# Patient Record
Sex: Male | Born: 1950 | Race: White | Hispanic: No | Marital: Married | Smoking: Former smoker
Health system: Southern US, Community
[De-identification: ages and names within clinical notes are randomized; demographics above are authoritative.]

## PROBLEM LIST (undated history)

## (undated) DIAGNOSIS — Z973 Presence of spectacles and contact lenses: Secondary | ICD-10-CM

## (undated) DIAGNOSIS — T7840XA Allergy, unspecified, initial encounter: Secondary | ICD-10-CM

## (undated) DIAGNOSIS — K219 Gastro-esophageal reflux disease without esophagitis: Secondary | ICD-10-CM

## (undated) DIAGNOSIS — M1990 Unspecified osteoarthritis, unspecified site: Secondary | ICD-10-CM

## (undated) DIAGNOSIS — N433 Hydrocele, unspecified: Secondary | ICD-10-CM

## (undated) DIAGNOSIS — C61 Malignant neoplasm of prostate: Secondary | ICD-10-CM

## (undated) DIAGNOSIS — K5730 Diverticulosis of large intestine without perforation or abscess without bleeding: Secondary | ICD-10-CM

## (undated) DIAGNOSIS — Z860100 Personal history of colon polyps, unspecified: Secondary | ICD-10-CM

## (undated) DIAGNOSIS — I10 Essential (primary) hypertension: Secondary | ICD-10-CM

## (undated) DIAGNOSIS — N529 Male erectile dysfunction, unspecified: Secondary | ICD-10-CM

## (undated) DIAGNOSIS — J301 Allergic rhinitis due to pollen: Secondary | ICD-10-CM

## (undated) DIAGNOSIS — R972 Elevated prostate specific antigen [PSA]: Secondary | ICD-10-CM

## (undated) HISTORY — PX: POLYPECTOMY: SHX149

## (undated) HISTORY — DX: Allergic rhinitis due to pollen: J30.1

## (undated) HISTORY — PX: PROSTATE SURGERY: SHX751

## (undated) HISTORY — PX: PROSTATE BIOPSY: SHX241

## (undated) HISTORY — DX: Diverticulosis of large intestine without perforation or abscess without bleeding: K57.30

## (undated) HISTORY — PX: JOINT REPLACEMENT: SHX530

## (undated) HISTORY — DX: Allergy, unspecified, initial encounter: T78.40XA

## (undated) HISTORY — DX: Essential (primary) hypertension: I10

## (undated) HISTORY — PX: COLONOSCOPY: SHX174

## (undated) HISTORY — PX: COLONOSCOPY WITH PROPOFOL: SHX5780

---

## 2006-07-19 ENCOUNTER — Ambulatory Visit: Payer: Self-pay | Admitting: Internal Medicine

## 2006-07-19 LAB — CONVERTED CEMR LAB
ALT: 30 units/L (ref 0–40)
Basophils Relative: 0.9 % (ref 0.0–1.0)
Bilirubin, Direct: 0.1 mg/dL (ref 0.0–0.3)
CO2: 32 meq/L (ref 19–32)
Creatinine, Ser: 1.1 mg/dL (ref 0.4–1.5)
Eosinophils Relative: 6.8 % — ABNORMAL HIGH (ref 0.0–5.0)
GFR calc Af Amer: 89 mL/min
Glucose, Bld: 116 mg/dL — ABNORMAL HIGH (ref 70–99)
HCT: 44.7 % (ref 39.0–52.0)
Hemoglobin: 15.8 g/dL (ref 13.0–17.0)
Leukocytes, UA: NEGATIVE
Lymphocytes Relative: 28.9 % (ref 12.0–46.0)
Monocytes Absolute: 0.6 10*3/uL (ref 0.2–0.7)
Neutro Abs: 3.5 10*3/uL (ref 1.4–7.7)
Nitrite: NEGATIVE
Potassium: 4.3 meq/L (ref 3.5–5.1)
RDW: 12.4 % (ref 11.5–14.6)
Specific Gravity, Urine: 1.015 (ref 1.000–1.03)
TSH: 3.21 microintl units/mL (ref 0.35–5.50)
Total Bilirubin: 0.8 mg/dL (ref 0.3–1.2)
Total Protein, Urine: NEGATIVE mg/dL
Total Protein: 6.8 g/dL (ref 6.0–8.3)
Triglycerides: 140 mg/dL (ref 0–149)
Urobilinogen, UA: 0.2 (ref 0.0–1.0)
WBC: 6.5 10*3/uL (ref 4.5–10.5)

## 2006-07-26 ENCOUNTER — Ambulatory Visit: Payer: Self-pay | Admitting: Internal Medicine

## 2006-09-17 ENCOUNTER — Ambulatory Visit: Payer: Self-pay | Admitting: Internal Medicine

## 2006-10-05 ENCOUNTER — Ambulatory Visit: Payer: Self-pay | Admitting: Gastroenterology

## 2006-10-16 ENCOUNTER — Ambulatory Visit: Payer: Self-pay | Admitting: Gastroenterology

## 2006-10-16 ENCOUNTER — Encounter: Payer: Self-pay | Admitting: Gastroenterology

## 2007-08-03 ENCOUNTER — Encounter: Payer: Self-pay | Admitting: *Deleted

## 2007-08-03 DIAGNOSIS — K5730 Diverticulosis of large intestine without perforation or abscess without bleeding: Secondary | ICD-10-CM | POA: Insufficient documentation

## 2007-08-03 DIAGNOSIS — I10 Essential (primary) hypertension: Secondary | ICD-10-CM

## 2007-08-03 DIAGNOSIS — D126 Benign neoplasm of colon, unspecified: Secondary | ICD-10-CM

## 2007-09-20 ENCOUNTER — Ambulatory Visit: Payer: Self-pay | Admitting: Internal Medicine

## 2007-09-20 LAB — CONVERTED CEMR LAB
Calcium: 9.1 mg/dL (ref 8.4–10.5)
Creatinine, Ser: 1 mg/dL (ref 0.4–1.5)
GFR calc Af Amer: 99 mL/min
Glucose, Bld: 98 mg/dL (ref 70–99)
Sodium: 140 meq/L (ref 135–145)

## 2007-09-22 ENCOUNTER — Encounter: Payer: Self-pay | Admitting: Internal Medicine

## 2008-09-08 ENCOUNTER — Ambulatory Visit: Payer: Self-pay | Admitting: Internal Medicine

## 2008-09-08 LAB — CONVERTED CEMR LAB
ALT: 32 units/L (ref 0–53)
AST: 23 units/L (ref 0–37)
Basophils Relative: 0.6 % (ref 0.0–3.0)
Bilirubin Urine: NEGATIVE
CO2: 30 meq/L (ref 19–32)
Calcium: 9 mg/dL (ref 8.4–10.5)
Creatinine, Ser: 1.2 mg/dL (ref 0.4–1.5)
Eosinophils Relative: 6 % — ABNORMAL HIGH (ref 0.0–5.0)
GFR calc non Af Amer: 66.01 mL/min (ref >60)
Glucose, Bld: 115 mg/dL — ABNORMAL HIGH (ref 70–99)
HCT: 43.4 % (ref 39.0–52.0)
HDL: 38.9 mg/dL — ABNORMAL LOW (ref >39.00)
Hemoglobin: 15.1 g/dL (ref 13.0–17.0)
Ketones, ur: NEGATIVE mg/dL
Leukocytes, UA: NEGATIVE
Lymphs Abs: 1.6 10*3/uL (ref 0.7–4.0)
MCV: 88.1 fL (ref 78.0–100.0)
Monocytes Absolute: 0.6 10*3/uL (ref 0.1–1.0)
Monocytes Relative: 9.7 % (ref 3.0–12.0)
Neutro Abs: 3.6 10*3/uL (ref 1.4–7.7)
Nitrite: NEGATIVE
Platelets: 238 10*3/uL (ref 150.0–400.0)
RBC: 4.93 M/uL (ref 4.22–5.81)
Sodium: 141 meq/L (ref 135–145)
TSH: 2.03 microintl units/mL (ref 0.35–5.50)
Total Bilirubin: 0.6 mg/dL (ref 0.3–1.2)
Total CHOL/HDL Ratio: 4
Total Protein: 7.4 g/dL (ref 6.0–8.3)
WBC: 6.2 10*3/uL (ref 4.5–10.5)
pH: 6 (ref 5.0–8.0)

## 2008-09-15 ENCOUNTER — Ambulatory Visit: Payer: Self-pay | Admitting: Internal Medicine

## 2009-11-05 ENCOUNTER — Ambulatory Visit: Payer: Self-pay | Admitting: Internal Medicine

## 2009-11-05 LAB — CONVERTED CEMR LAB
ALT: 38 units/L (ref 0–53)
AST: 24 units/L (ref 0–37)
Alkaline Phosphatase: 73 units/L (ref 39–117)
BUN: 18 mg/dL (ref 6–23)
Basophils Relative: 0.9 % (ref 0.0–3.0)
Bilirubin Urine: NEGATIVE
Bilirubin, Direct: 0.1 mg/dL (ref 0.0–0.3)
Chloride: 105 meq/L (ref 96–112)
Creatinine, Ser: 1.1 mg/dL (ref 0.4–1.5)
Eosinophils Relative: 6.2 % — ABNORMAL HIGH (ref 0.0–5.0)
GFR calc non Af Amer: 76.7 mL/min (ref >60)
Glucose, Bld: 103 mg/dL — ABNORMAL HIGH (ref 70–99)
Hemoglobin: 14.3 g/dL (ref 13.0–17.0)
Ketones, ur: NEGATIVE mg/dL
LDL Cholesterol: 75 mg/dL (ref 0–99)
Lymphocytes Relative: 24.5 % (ref 12.0–46.0)
Neutro Abs: 4.1 10*3/uL (ref 1.4–7.7)
Neutrophils Relative %: 58.9 % (ref 43.0–77.0)
Nitrite: NEGATIVE
Potassium: 4 meq/L (ref 3.5–5.1)
RBC: 4.72 M/uL (ref 4.22–5.81)
Total Protein, Urine: NEGATIVE mg/dL
Total Protein: 7 g/dL (ref 6.0–8.3)
Urine Glucose: NEGATIVE mg/dL
VLDL: 28.4 mg/dL (ref 0.0–40.0)
WBC: 7 10*3/uL (ref 4.5–10.5)
pH: 6 (ref 5.0–8.0)

## 2009-11-12 ENCOUNTER — Ambulatory Visit: Payer: Self-pay | Admitting: Internal Medicine

## 2010-05-18 ENCOUNTER — Ambulatory Visit
Admission: RE | Admit: 2010-05-18 | Discharge: 2010-05-18 | Payer: Self-pay | Source: Home / Self Care | Attending: Internal Medicine | Admitting: Internal Medicine

## 2010-05-18 DIAGNOSIS — M5430 Sciatica, unspecified side: Secondary | ICD-10-CM | POA: Insufficient documentation

## 2010-05-18 DIAGNOSIS — E669 Obesity, unspecified: Secondary | ICD-10-CM | POA: Insufficient documentation

## 2010-05-19 ENCOUNTER — Telehealth: Payer: Self-pay | Admitting: Internal Medicine

## 2010-05-23 ENCOUNTER — Encounter: Payer: Self-pay | Admitting: Internal Medicine

## 2010-06-14 NOTE — Assessment & Plan Note (Signed)
Summary: PHY-OYU   Vital Signs:  Patient profile:   60 year old male Height:      66 inches Weight:      220 pounds BMI:     35.64 O2 Sat:      96 % on Room air Temp:     97.7 degrees F oral Pulse rate:   76 / minute BP sitting:   124 / 80  (left arm) Cuff size:   large  Vitals Entered By: Bill Salinas CMA (November 12, 2009 2:27 PM)  O2 Flow:  Room air CC: pt here for cpx/ab  Vision Screening:      Vision Comments: Pt had normal eye exam with Dr Freida Busman in [place] April 2011   Primary Care Provider:  Mirah Nevins  CC:  pt here for cpx/ab.  History of Present Illness: Patient presents for routine medical exam.  He reports that he has some numbness in the left hand at the ulnar aspect. He is also sensitive at the elbow especially with weight bearing/leaning on the elbow.  He reports that his sister died last month: lymphoma at age 8.     Current Medications (verified): 1)  Hydrochlorothiazide 25 Mg  Tabs (Hydrochlorothiazide) .... 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  Married '77- 19 years divorced; married '02 2 sons - '79, '80; 1 grandson  Things are OK  Risk Factors: Alcohol Use: <1 (09/15/2008) Caffeine Use: 1 (09/15/2008) Exercise: no (14-Oct-2007)  Risk Factors: Smoking Status: quit (09/15/2008)  Review of Systems       The  patient complains of severe indigestion/heartburn.  The patient denies anorexia, fever, weight loss, weight gain, vision loss, decreased hearing, hoarseness, chest pain, syncope, peripheral edema, prolonged cough, headaches, hemoptysis, abdominal pain, melena, hematochezia, hematuria, incontinence, muscle weakness, suspicious skin lesions, difficulty walking, depression, unusual weight change, abnormal bleeding, enlarged lymph nodes, angioedema, and testicular masses.    Physical Exam  General:  WNWD white male in no distress Head:  Normocephalic and atraumatic without obvious abnormalities. No apparent alopecia or balding. Eyes:  No corneal or conjunctival inflammation noted. EOMI. Perrla. Funduscopic exam benign, without hemorrhages, exudates or papilledema. Vision grossly normal. Ears:  External ear exam shows no significant lesions or deformities.  Otoscopic examination reveals clear canals, tympanic membranes are intact bilaterally without bulging, retraction, inflammation or discharge. Hearing is grossly normal bilaterally. Nose:  no external deformity and no external erythema.   Mouth:  Oral mucosa and oropharynx without lesions or exudates.  Teeth in good repair. Neck:  supple, no masses, no thyromegaly, and no carotid bruits.   Chest Wall:  no deformities.   Lungs:  Normal respiratory effort, chest expands symmetrically. Lungs are clear to auscultation, no crackles or wheezes. Heart:  Normal rate and regular rhythm. S1 and S2 normal without gallop, murmur, click, rub or other extra sounds. Abdomen:  Bowel sounds positive,abdomen soft and non-tender without masses, organomegaly or hernias noted. Prostate:  deferred to normal PSA Msk:  normal ROM, no  joint tenderness, no joint swelling, and no redness over joints.   Pulses:  2+ radial Extremities:  No clubbing, cyanosis, edema, or deformity noted with normal full range of motion of all joints.   Neurologic:  alert & oriented X3, cranial  nerves II-XII intact, strength normal in all extremities, gait normal, and DTRs symmetrical and normal.  Normal sensation in his hands. Negative Tinel's Skin:  turgor normal, color normal, and no suspicious lesions.   Cervical Nodes:  no anterior cervical adenopathy and no posterior cervical adenopathy.   Psych:  Oriented X3, memory intact for recent and remote, normally interactive, and good eye contact.     Impression & Recommendations:  Problem # 1:  HYPERTENSION (ICD-401.9)  His updated medication list for this problem includes:    Hydrochlorothiazide 25 Mg Tabs (Hydrochlorothiazide) .Marland Kitchen... Take 1/2 tablet by mouth once a day  BP today: 124/80 Prior BP: 112/80 (09/15/2008)  Labs Reviewed: K+: 4.0 (11/05/2009) Creat: : 1.1 (11/05/2009)    Good control on low dose medication. Renal function and potassium normal  Plan - continue present meds.  Problem # 2:  Preventive Health Care (ICD-V70.0) Unremarkable history except for some numbness which is most likely positional -pressure at the ulnar notch at the elbow. Exam is normal. Lab results are within normal limits. He is current witho colorectal cancer screening with last colonoscopy in '08. He is current with tetnus booster.  In summary - a nice man wo appears to be medically stable. He should have a follow-up exam in 18-24 months. He will otherwise return on a as needed basis.  Complete Medication List: 1)  Hydrochlorothiazide 25 Mg Tabs (Hydrochlorothiazide) .... Take 1/2 tablet by mouth once a day  Luis Knapp Note: All result statuses are Final unless otherwise noted.  Tests: (1) BMP (METABOL)   Sodium                    142 mEq/L                   135-145   Potassium                 4.0 mEq/L                   3.5-5.1   Chloride                  105 mEq/L                   96-112   Carbon Dioxide            30 mEq/L                    19-32   Glucose              [H]  103 mg/dL                   16-10   BUN                        18 mg/dL                    9-60   Creatinine                1.1 mg/dL                   4.5-4.0   Calcium  9.0 mg/dL                   1.6-10.9   GFR                       76.70 mL/min                >60  Tests: (2) Lipid Panel (LIPID)   Cholesterol               143 mg/dL                   6-045     ATP III Classification            Desirable:  < 200 mg/dL                    Borderline High:  200 - 239 mg/dL               High:  > = 240 mg/dL   Triglycerides             142.0 mg/dL                 4.0-981.1     Normal:  <150 mg/dL     Borderline High:  914 - 199 mg/dL   HDL                       78.29 mg/dL                 >56.21   VLDL Cholesterol          28.4 mg/dL                  3.0-86.5   LDL Cholesterol           75 mg/dL                    7-84  CHO/HDL Ratio:  CHD Risk                             4                    Men          Women     1/2 Average Risk     3.4          3.3     Average Risk          5.0          4.4     2X Average Risk          9.6          7.1     3X Average Risk          15.0          11.0                           Tests: (3) CBC Platelet w/Diff (CBCD)   White Cell Count          7.0 K/uL                    4.5-10.5   Red Cell Count            4.72 Mil/uL  4.22-5.81   Hemoglobin                14.3 g/dL                   19.1-47.8   Hematocrit                41.0 %                      39.0-52.0   MCV                       87.0 fl                     78.0-100.0   MCHC                      34.8 g/dL                   29.5-62.1   RDW                       13.4 %                      11.5-14.6   Platelet Count            286.0 K/uL                  150.0-400.0   Neutrophil %              58.9 %                      43.0-77.0   Lymphocyte %              24.5 %                      12.0-46.0   Monocyte %                9.5 %                       3.0-12.0   Eosinophils%         [H]  6.2 %                        0.0-5.0   Basophils %               0.9 %                       0.0-3.0   Neutrophill Absolute      4.1 K/uL                    1.4-7.7   Lymphocyte Absolute       1.7 K/uL                    0.7-4.0   Monocyte Absolute         0.7 K/uL                    0.1-1.0  Eosinophils, Absolute                             0.4 K/uL  0.0-0.7   Basophils Absolute        0.1 K/uL                    0.0-0.1  Tests: (4) Hepatic/Liver Function Panel (HEPATIC)   Total Bilirubin           0.4 mg/dL                   7.8-2.9   Direct Bilirubin          0.1 mg/dL                   5.6-2.1   Alkaline Phosphatase      73 U/L                      39-117   AST                       24 U/L                      0-37   ALT                       38 U/L                      0-53   Total Protein             7.0 g/dL                    3.0-8.6   Albumin                   4.2 g/dL                    5.7-8.4  Tests: (5) TSH (TSH)   FastTSH                   2.68 uIU/mL                 0.35-5.50  Tests: (6) Prostate Specific Antigen (PSA)   PSA-Hyb                   2.98 ng/mL                  0.10-4.00  Tests: (7) UDip Only (UDIP)   Color                     LT. YELLOW       RANGE:  Yellow;Lt. Yellow   Clarity                   CLEAR                       Clear   Specific Gravity          1.025                       1.000 - 1.030   Urine Ph                  6.0                         5.0-8.0   Protein  NEGATIVE                    Negative   Urine Glucose             NEGATIVE                    Negative   Ketones                   NEGATIVE                    Negative   Urine Bilirubin           NEGATIVE                    Negative   Blood                     TRACE-LYSED                 Negative   Urobilinogen              0.2                         0.0 - 1.0   Leukocyte Esterace        NEGATIVE                    Negative   Nitrite                   NEGATIVE                     NegativePrescriptions: HYDROCHLOROTHIAZIDE 25 MG  TABS (HYDROCHLOROTHIAZIDE) Take 1/2 tablet by mouth once a day  #90 x 3   Entered and Authorized by:   Jacques Navy MD   Signed by:   Jacques Navy MD on 11/12/2009   Method used:   Electronically to        [facility]  [phone]* (retail)       [address] [place], [place]  [address]       Ph: [phone] or [phone]       Fax: [phone]   RxID:   [id number]

## 2010-06-16 NOTE — Assessment & Plan Note (Signed)
Summary: BACK PAIN/NWS  #   Vital Signs:  Patient profile:   60 year old male Height:      66 inches Weight:      227 pounds BMI:     36.77 O2 Sat:      97 % on Room air Temp:     97.5 degrees F oral Pulse rate:   85 / minute BP sitting:   160 / 98  (left arm) Cuff size:   large  Vitals Entered By: Ami Bullins CMA (May 18, 2010 9:09 AM)  O2 Flow:  Room air CC: pt here with c/o lower back pain radiating to his right leg with tingling sensation  down to his right foot. Symptoms constent since christmas with little relief taking acetaminophen/ ab   CC:  pt here with c/o lower back pain radiating to his right leg with tingling sensation  down to his right foot. Symptoms constent since christmas with little relief taking acetaminophen/ ab.  Current Medications (verified): 1)  Hydrochlorothiazide 25 Mg  Tabs (Hydrochlorothiazide) .... Take 1/2 Tablet By Mouth Once A Day  Allergies (verified): No Known Drug Allergies  Physical Exam  General:  Well-developed,well-nourished,in no acute distress; alert,appropriate and cooperative throughout examination Eyes:  pupils equal and pupils round.   Neck:  supple.   Lungs:  normal respiratory effort.   Heart:  normal rate and regular rhythm.   Abdomen:  obese Msk:  back exam: nl stand, flex to 140 degrees with some pain, nl gait, nl toe/heel walk, DTR at patellar tendon slightly diminished right vs left, nl achilles reflex bilaterally, nl sensation to light touch and deep vibratory sensation. No CVAT. Pulses:  2+ radial Neurologic:  alert & oriented X3 and cranial nerves II-XII intact.   Skin:  turgor normal and color normal.     Impression & Recommendations:  Problem # 1:  SCIATICA (ICD-724.3) Mild to moderate discomfort with a diminished patellar reflex right. Suspect mild nerve compression:DJD vs DDD.  Plan - L-S spine films with flex and extension           refer to PT           otc NSAID of choice - GI precautions  given.  Orders: T-Lumbar Spine Comp w/[facility] [phone]) Physical Therapy Referral (PT)  Problem # 2:  OBESITY, CLASS II (ICD-278.00) discussed weight mgt: smart food choices, PORTION SIZE  control, exercise - 30 minutes 3 times a week. Target - 25 lb weight loss: goal 1 lb per month.   Complete Medication List: 1)  Hydrochlorothiazide 25 Mg Tabs (Hydrochlorothiazide) .... Take 1/2 tablet by mouth once a day   Orders Added: 1)  T-Lumbar Spine Comp w/[facility] [72114TC] 2)  Physical Therapy Referral [PT] 3)  Est. Patient Level III [45409]

## 2010-06-16 NOTE — Progress Notes (Signed)
Summary: xray results  Phone Note Call from Patient Call back at Home Phone [phone]   Caller: Patient Call For: DR Aubri Gathright Summary of Call: Pt requests xray results. Initial call taken by: Verdell Face,  May 19, 2010 10:07 AM  Follow-up for Phone Call        called patient: mild loss of disk height L4-5, L5-S1.  Plan - prednisone burst and taper see RX Follow-up by: Jacques Navy MD,  May 19, 2010 6:38 PM    New/Updated Medications: PREDNISONE 10 MG TABS (PREDNISONE) 4 tabs x 1 day, 3 tabs once daily x 3 days, 2 tabs once daily x 3 days, 1 tab once daily x 6 days for discogenic back pain Prescriptions: PREDNISONE 10 MG TABS (PREDNISONE) 4 tabs x 1 day, 3 tabs once daily x 3 days, 2 tabs once daily x 3 days, 1 tab once daily x 6 days for discogenic back pain  #25 x 0   Entered and Authorized by:   Jacques Navy MD   Signed by:   Jacques Navy MD on 05/19/2010   Method used:   [facility]ron[facility] to        [facility]  [phone]* (retail)       [address] [place], [place]  [address]       Ph: [phone] or [phone]       Fax: [phone]   RxID:   [id number]

## 2010-06-20 ENCOUNTER — Encounter: Payer: Self-pay | Admitting: Internal Medicine

## 2010-06-22 NOTE — Miscellaneous (Signed)
Summary: [facility]  [facility]   Imported By: Sherian Rein 06/13/2010 07:04:46  _____________________________________________________________________  External Attachment:    Type:   Image     Comment:   External Document

## 2010-07-12 NOTE — Letter (Signed)
Summary: [facility]  [facility]   Imported By: Lester Roy 07/05/2010 07:42:00  _____________________________________________________________________  External Attachment:    Type:   Image     Comment:   External Document

## 2010-09-30 NOTE — Assessment & Plan Note (Signed)
[facility]                           PRIMARY CARE OFFICE NOTE   [name], [name]                      MRN:          [id number]  DATE:07/26/2006                            DOB:          1950-08-13    Luis Knapp is a 60 year old Caucasian gentleman who presents to  establish for ongoing continuity care.   CHIEF COMPLAINT:  1. URI.  Patient reports that the last 7 to 10 days he developed a      problem with very thick mucus drainage that was difficult to clear,      and obstructing his breathing.  He was seen at [facility], and at that time had a fever and was diagnosed with      infection; treated with antibiotic.  The patient cannot recall the      name of this medication.  He had no other treatment.  He still has      some persistent drainage, but it is much less thick.  He reports he      just has some agitation and jitteriness, and has been homebound for      approximately a week.  2. Right upper quadrant abdominal pain.  He reports that this is      intermittent.  It is kind of an achy discomfort.  There is some      radiation to his back.  His discomfort is worse after eating.  He      has had no change in his stools.  He does have a chronic      intermittent bowel habit.   PAST MEDICAL HISTORY:   SURGERIES:  None.   TRAUMA:  Shattered right great toe 25 years ago.   MEDICAL:  Patient had the usual childhood diseases, and is fully  immunized.  He has history of hay fever that is seasonal, history of  high blood pressure readings, and is on medications.   CURRENT MEDICATIONS:  Hydrochlorothiazide 25 mg daily.   HABITS:  Patient is a former smoker, but does not currently smoke.  He  does not use any alcoholic beverages or recreational drugs.   FAMILY HISTORY:  Father died at age 51.  He had an MI, questionable  pulmonary embolus.  He also had dementia and was in a nursing facility.  Mother is 5.  She has  dementia.  She is in assisted living.  Patient  has 2 sisters, 1 in good health, a half sister with non-Hodgkin's  lymphoma and diabetes.  One brother in good health.  Family history  otherwise unremarkable except for hypertension.   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  He works for a firm out of [place], but has been assigned to  [place] for several years working for [employer].  He is able  to do much of his work out of the home.  Patient was an Probation officer.  Patient was married for 19 years, divorced.  Married to his current wife  for 6 years.  He has 2 sons.  His marriage is in good health.  His wife  is in good health.   REVIEW OF SYSTEMS:  Patient has had no fevers, sweats, or chills.  His  weight has been stable.  He does have sleep latency and sleep duration  problems, which he associates with stress, and has been using an over-  the-counter product for sleep.  Patient had an eye exam 3 months ago.  No ENT, cardiovascular, or respiratory complaints.  He has rare  heartburn that actually has improved with better eating habit.  No GE,  musculoskeletal, or neurologic problems.   EXAMINATION:  Temperature was 97.7.  Blood pressure 143/84.  Pulse 99.  Weight 204.  GENERAL APPEARANCE:  This is an overweight Caucasian gentleman in no  acute distress.  HEENT EXAM:  Normocephalic and atraumatic.  EACs and TMs are normal.  Oropharynx with native dentition in good repair.  No buccal or palate  lesions were noted.  Posterior pharynx was clear.  Conjunctivae and  sclerae were clear.  PERRLA.  EOMI.  Funduscopic exam was unremarkable.  NECK:  Was supple without thyromegaly.  NODES:  No adenopathy was noted in the cervical or supraclavicular  regions.  CHEST:  No CVA tenderness.  LUNGS:  Clear with no rales, wheezes, or rhonchi.  CARDIOVASCULAR:  Two plus radial pulse.  No JVD or carotid bruits.  He  had a quiet precordium with a  regular rate and rhythm without murmurs,  rubs, or gallops.  ABDOMEN:  Soft.  No guarding.  No rebound.  No organosplenomegaly was  appreciated.  GENITALIA:  Normal male phallus.  Bilaterally descended testicles  without masses.  RECTAL:  Exam revealed normal sphincter tone.  Prostate was smooth with  normal size and contour without nodules.  EXTREMITIES:  Without clubbing, cyanosis, edema, or deformity.  NEUROLOGIC:  Exam was nonfocal.  SKIN:  Was clear.   DATABASE:  Twelve-lead electrocardiogram revealed a normal sinus rhythm,  and it was a normal EKG.   LABORATORY DATABASE:  Hemoglobin 15.8 gm.  White count was 6.5 with a  normal differential.  Chemistries revealed a glucose of 116.  Electrolytes were normal.  Kidney function was normal with a creatinine  of 1.1 and a GFR of 74 mL per minute.  Liver functions were normal.  Cholesterol 183, triglycerides 140, HDL 42.8, LDL 112.  Thyroid function  normal with a TSH of 3.21.  PSA was normal at 2.23.  Urinalysis was  negative.   ASSESSMENT AND PLAN:  1. Lab abnormality.  Patient has a serum glucose of 1 point above      normal range.  I have encouraged him to take this as a warning      sign, and to be careful about concentrated sweets and simple      carbohydrates.  I also encouraged regular exercise program.  2. Health maintenance.  I have encouraged the patient to consider      colonoscopy.  He does not want to schedule at this time, but will      give it consideration.  Patient was given tetanus booster at      today's visit.   SUMMARY:  This is a very pleasant gentleman who seems medically stable  at this time who does need to be careful in regards to carbohydrates and  sugars.  He does need to contact me to schedule colonoscopy at his  convenience.     Rosalyn Gess Norins, MD  Electronically  Signed    MEN/MedQ  DD: 07/27/2006  DT: 07/28/2006  Job #: [id number]   cc:   Aron Baba

## 2010-11-22 ENCOUNTER — Other Ambulatory Visit (INDEPENDENT_AMBULATORY_CARE_PROVIDER_SITE_OTHER): Payer: Managed Care, Other (non HMO)

## 2010-11-22 ENCOUNTER — Other Ambulatory Visit: Payer: Self-pay | Admitting: Internal Medicine

## 2010-11-22 DIAGNOSIS — Z0000 Encounter for general adult medical examination without abnormal findings: Secondary | ICD-10-CM

## 2010-11-22 DIAGNOSIS — Z0389 Encounter for observation for other suspected diseases and conditions ruled out: Secondary | ICD-10-CM

## 2010-11-22 LAB — URINALYSIS
Bilirubin Urine: NEGATIVE
Hgb urine dipstick: NEGATIVE
Ketones, ur: NEGATIVE
Leukocytes, UA: NEGATIVE
Nitrite: NEGATIVE
Urobilinogen, UA: 0.2 (ref 0.0–1.0)

## 2010-11-22 LAB — LIPID PANEL
Cholesterol: 177 mg/dL (ref 0–200)
HDL: 45.9 mg/dL (ref >39.00)
Total CHOL/HDL Ratio: 4
Triglycerides: 221 mg/dL — ABNORMAL HIGH (ref 0.0–149.0)
VLDL: 44.2 mg/dL — ABNORMAL HIGH (ref 0.0–40.0)

## 2010-11-22 LAB — BASIC METABOLIC PANEL
CO2: 29 mEq/L (ref 19–32)
Chloride: 101 mEq/L (ref 96–112)
Potassium: 4.1 mEq/L (ref 3.5–5.1)

## 2010-11-22 LAB — CBC WITH DIFFERENTIAL/PLATELET
Basophils Relative: 1 % (ref 0.0–3.0)
Eosinophils Absolute: 0.4 10*3/uL (ref 0.0–0.7)
Lymphocytes Relative: 28.9 % (ref 12.0–46.0)
MCHC: 34.8 g/dL (ref 30.0–36.0)
Monocytes Relative: 9.2 % (ref 3.0–12.0)
Neutrophils Relative %: 54.2 % (ref 43.0–77.0)
RBC: 4.88 Mil/uL (ref 4.22–5.81)
WBC: 6.7 10*3/uL (ref 4.5–10.5)

## 2010-11-22 LAB — TSH: TSH: 2.69 u[IU]/mL (ref 0.35–5.50)

## 2010-11-22 LAB — HEPATIC FUNCTION PANEL
Albumin: 4.5 g/dL (ref 3.5–5.2)
Alkaline Phosphatase: 66 U/L (ref 39–117)
Total Protein: 7.1 g/dL (ref 6.0–8.3)

## 2010-11-22 LAB — PSA: PSA: 3.15 ng/mL (ref 0.10–4.00)

## 2010-11-24 ENCOUNTER — Encounter: Payer: Self-pay | Admitting: Internal Medicine

## 2010-11-29 ENCOUNTER — Encounter: Payer: Self-pay | Admitting: Internal Medicine

## 2010-12-02 ENCOUNTER — Encounter: Payer: Self-pay | Admitting: Internal Medicine

## 2010-12-02 ENCOUNTER — Ambulatory Visit (INDEPENDENT_AMBULATORY_CARE_PROVIDER_SITE_OTHER): Payer: Managed Care, Other (non HMO) | Admitting: Internal Medicine

## 2010-12-02 VITALS — BP 124/78 | HR 90 | Temp 98.6°F | Ht 66.5 in | Wt 231.0 lb

## 2010-12-02 DIAGNOSIS — Z0000 Encounter for general adult medical examination without abnormal findings: Secondary | ICD-10-CM

## 2010-12-02 DIAGNOSIS — I10 Essential (primary) hypertension: Secondary | ICD-10-CM

## 2010-12-02 DIAGNOSIS — M5430 Sciatica, unspecified side: Secondary | ICD-10-CM

## 2010-12-02 NOTE — Progress Notes (Signed)
Subjective:    Patient ID: Luis Knapp, male    DOB: Sep 16, 1950, 60 y.o.   MRN: [id number]  HPI Luis Knapp presents for a routine medical exam, In the interval since his last visit in Jan '12 he has had good results with improving his back pain and restoring mobility by seeing [facility]. 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  Otherwise doing well.   Past Medical History  Diagnosis Date  . Colon polyp   . Diverticulosis of colon (without mention of hemorrhage)   . Allergic rhinitis due to pollen   . Unspecified essential hypertension    No past surgical history on file. Family History  Problem Relation Age of Onset  . Coronary artery disease Father   . Heart attack Father     2nd one fatal  . Pulmonary embolism Father     ?  Marland Kitchen Dementia Father   . Dementia Mother   . Hypertension Mother   . Macular degeneration Mother   . Diabetes Sister   . Prostate cancer Neg Hx   . Colon cancer Neg Hx    History   Social History  . Marital Status: Married    Spouse Name: N/A    Number of Children: N/A  . Years of Education: N/A   Occupational History  . 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 Smoking status: Former Games developer  . Smokeless tobacco: Never Used  . Alcohol Use: No  . Drug Use: No  . Sexually Active: Not on file   Other Topics Concern  . Not on file   Social History Narrative   Married '77- 19 years divorced; married '02. 2 sons - '79, '80; 1 grandson. Things are ok. Work is OK - Quarry manager.        Review of Systems Review of Systems  Constitutional:  Negative for fever, chills, activity change and unexpected weight change.  HEENT:  Negative for hearing loss, ear pain, congestion, neck stiffness and postnasal drip. Negative for sore throat or swallowing problems. Negative for dental complaints.   Eyes: Negative for vision loss or change  in visual acuity.  Respiratory: Negative for chest tightness and wheezing.   Cardiovascular: Negative for chest pain and palpitation. No decreased exercise tolerance Gastrointestinal: No change in bowel habit. No bloating or gas. No reflux or indigestion Genitourinary: Negative for urgency, frequency, flank pain and difficulty urinating.  Musculoskeletal: Negative for myalgias, back pain, arthralgias and gait problem.  Neurological: Negative for dizziness, tremors, weakness and headaches.  Hematological: Negative for adenopathy.  Psychiatric/Behavioral: Negative for behavioral problems and dysphoric mood.       Objective:   Physical Exam Vital signs reviewed - weight is noted. Gen'l: Well nourished well developed white male in no acute distress  HENT:  Head: Normocephalic and atraumatic.  Right Ear: External ear normal. EAC/TM nl Left Ear: External ear normal.  EAC/TM nl Nose: Nose normal.  Mouth/Throat: Oropharynx is clear and moist. Dentition - native, in good repair. No buccal or palatal lesions. Posterior pharynx clear. Eyes: Conjunctivae and sclera clear. EOM intact. Pupils are equal, round, and reactive to light. Right eye exhibits no discharge. Left eye exhibits no discharge. Neck: Normal range of motion. Neck supple. No JVD present. No tracheal deviation present. No thyromegaly present.  Cardiovascular: Normal rate, regular rhythm, no gallop, no friction rub, no murmur  heard.      Quiet precordium. 2+ radial and DP pulses . No carotid bruits Pulmonary/Chest: Effort normal. No respiratory distress or increased WOB, no wheezes, no rales. No chest wall deformity or CVAT. Abdominal: Soft, protuberant. Bowel sounds are normal in all quadrants. He exhibits no distension, no tenderness, no rebound or guarding, No heptosplenomegaly  Genitourinary:  deferred Musculoskeletal: Normal range of motion. He exhibits no edema and no tenderness.       Small and large joints without redness,  synovial thickening or deformity. Full range of motion preserved about all small, median and large joints.  Lymphadenopathy:    He has no cervical or supraclavicular adenopathy.  Neurological: He is alert and oriented to person, place, and time. CN II-XII intact. DTRs 2+ and symmetrical biceps, radial and patellar tendons. Cerebellar function normal with no tremor, rigidity, normal gait and station.  Skin: Skin is warm and dry. No rash noted. No erythema.  Psychiatric: He has a normal mood and affect. His behavior is normal. Thought content normal.   Lab Results  Component Value Date   WBC 6.7 11/22/2010   HGB 14.9 11/22/2010   HCT 43.0 11/22/2010   PLT 259.0 11/22/2010   CHOL 177 11/22/2010   TRIG 221.0* 11/22/2010   HDL 45.90 11/22/2010   LDLDIRECT 110.7 11/22/2010   ALT 41 11/22/2010   AST 30 11/22/2010   NA 139 11/22/2010   K 4.1 11/22/2010   CL 101 11/22/2010   CREATININE 1.2 11/22/2010   BUN 22 11/22/2010   CO2 29 11/22/2010   TSH 2.69 11/22/2010   PSA 3.15 11/22/2010           Assessment & Plan:

## 2010-12-04 DIAGNOSIS — Z0000 Encounter for general adult medical examination without abnormal findings: Secondary | ICD-10-CM | POA: Insufficient documentation

## 2010-12-04 NOTE — Assessment & Plan Note (Signed)
Interval medical history is stable. Physical exam is only notable for midriff weight gain. Lab results are in normal limits including a very good lipid profile and normal PSA. He is current with colorectal cancer screening with last study in May '09. Immunizations: Tdap May '09. He is a candidate for shingles vaccine at his discretion.  In summary - a nice man who appears to be medically stable. He is encouraged to address weight management: reviewed with him the principles of smart food choices, portion size control - hand as measure and regular exercise. Goal is to loose 1 lb per month with a target weight of 200 lbs. He will return in as needed or in 1 year.

## 2010-12-04 NOTE — Assessment & Plan Note (Signed)
BP Readings from Last 3 Encounters:  12/02/10 124/78  05/18/10 160/98  11/12/09 124/80   Good control on HCTZ alone! Labs are good.  Plan - no change in regimen.

## 2010-12-04 NOTE — Assessment & Plan Note (Signed)
He reports that [facility] has been very helpful and he is able to do almost activities. He does continue the exercises he has been taught and does continue to make visits to them.  Plan - continue life-style management of this problem.

## 2011-04-28 ENCOUNTER — Other Ambulatory Visit: Payer: Self-pay | Admitting: Internal Medicine

## 2011-08-29 ENCOUNTER — Encounter: Payer: Self-pay | Admitting: Gastroenterology

## 2011-10-17 ENCOUNTER — Ambulatory Visit (AMBULATORY_SURGERY_CENTER): Payer: Managed Care, Other (non HMO) | Admitting: *Deleted

## 2011-10-17 VITALS — Ht 66.0 in | Wt 231.6 lb

## 2011-10-17 DIAGNOSIS — Z1211 Encounter for screening for malignant neoplasm of colon: Secondary | ICD-10-CM

## 2011-10-17 MED ORDER — PEG-KCL-NACL-NASULF-NA ASC-C 100 G PO SOLR: ORAL | Status: DC

## 2011-10-30 ENCOUNTER — Encounter: Payer: Self-pay | Admitting: Gastroenterology

## 2011-10-30 ENCOUNTER — Ambulatory Visit (AMBULATORY_SURGERY_CENTER): Payer: Managed Care, Other (non HMO) | Admitting: Gastroenterology

## 2011-10-30 VITALS — BP 123/80 | HR 79 | Temp 97.4°F | Resp 13 | Ht 66.0 in | Wt 231.0 lb

## 2011-10-30 DIAGNOSIS — Z86010 Personal history of colonic polyps: Secondary | ICD-10-CM

## 2011-10-30 DIAGNOSIS — Z1211 Encounter for screening for malignant neoplasm of colon: Secondary | ICD-10-CM

## 2011-10-30 DIAGNOSIS — D126 Benign neoplasm of colon, unspecified: Secondary | ICD-10-CM

## 2011-10-30 DIAGNOSIS — K5730 Diverticulosis of large intestine without perforation or abscess without bleeding: Secondary | ICD-10-CM

## 2011-10-30 MED ORDER — SODIUM CHLORIDE 0.9 % IV SOLN: 500.0000 mL | INTRAVENOUS | Status: DC

## 2011-10-30 NOTE — Op Note (Signed)
[facility] [address] [place], [place]  [address]  COLONOSCOPY PROCEDURE REPORT  PATIENT:  Luis Knapp, Luis Knapp  MR#:  [id number] BIRTHDATE:  1950/05/31, 60 yrs. old  GENDER:  male ENDOSCOPIST:  Barbette Hair. Arlyce Dice, MD REF. BY: PROCEDURE DATE:  10/30/2011 PROCEDURE:  Colonoscopy with snare polypectomy ASA CLASS:  Class II INDICATIONS:  Screening, history of pre-cancerous (adenomatous) colon polyps Index polypectomy 2008 MEDICATIONS:   MAC sedation, administered by CRNA propofol 200mg IV  DESCRIPTION OF PROCEDURE:   After the risks benefits and alternatives of the procedure were thoroughly explained, informed consent was obtained.  Digital rectal exam was performed and revealed no abnormalities.   The LB [id number] endoscope was introduced through the anus and advanced to the cecum, which was identified by both the appendix and ileocecal valve, without limitations.  The quality of the prep was excellent, using MoviPrep.  The instrument was then slowly withdrawn as the colon was fully examined. <<PROCEDUREIMAGES>>  FINDINGS:  A sessile polyp was found in the ascending colon. It was 3 mm in size. Polyp was snared without cautery. Retrieval was successful (see image4). snare polyp  Moderate diverticulosis was found in the sigmoid colon (see image1).  Scattered diverticula were found. descending to ascending colon  This was otherwise a normal examination of the colon (see image3 and image6). Retroflexed views in the rectum revealed no abnormalities.    The time to cecum =  1) 1.0  minutes. The scope was then withdrawn in 1) 6.75  minutes from the cecum and the procedure completed. COMPLICATIONS:  None ENDOSCOPIC IMPRESSION: 1) 3 mm sessile polyp in the ascending colon 2) Moderate diverticulosis in the sigmoid colon 3) Diverticula, scattered 4) Otherwise normal examination RECOMMENDATIONS: 1) If the polyp(s) removed today are proven to be adenomatous (pre-cancerous)  polyps, you will need a repeat colonoscopy in 5 years. Otherwise you should continue to follow colorectal cancer screening guidelines for "routine risk" patients with colonoscopy in 10 years. You will receive a letter within 1-2 weeks with the results of your biopsy as well as final recommendations. Please call my office if you have not received a letter after 3 weeks. REPEAT EXAM:  You will receive a letter from Dr. Arlyce Dice in 1-2 weeks, after reviewing the final pathology, with followup recommendations.  ______________________________ Barbette Hair Arlyce Dice, MD  CC:  Jacques Navy, MD  n. Rosalie DoctorBarbette Hair. Ayodele Sangalang at 10/30/2011 11:09 AM  Aron Baba, [phone]

## 2011-10-30 NOTE — Progress Notes (Signed)
Patient did not experience any of the following events: a burn prior to discharge; a fall within the facility; wrong site/side/patient/procedure/implant event; or a hospital transfer or hospital admission upon discharge from the facility. (G8907) Patient did not have preoperative order for IV antibiotic SSI prophylaxis. (G8918)  

## 2011-10-30 NOTE — Patient Instructions (Addendum)

## 2011-10-31 ENCOUNTER — Telehealth: Payer: Self-pay | Admitting: *Deleted

## 2011-10-31 NOTE — Telephone Encounter (Signed)
  Follow up Call-  Call back number 10/30/2011  Post procedure Call Back phone  # [phone]  Permission to leave phone message Yes     Patient questions:  Do you have a fever, pain , or abdominal swelling? no Pain Score  0 *  Have you tolerated food without any problems? yes  Have you been able to return to your normal activities? yes  Do you have any questions about your discharge instructions: Diet   no Medications  no Follow up visit  no  Do you have questions or concerns about your Care? no  Actions: * If pain score is 4 or above: No action needed, pain <4.

## 2011-11-03 ENCOUNTER — Encounter: Payer: Self-pay | Admitting: Gastroenterology

## 2012-03-20 ENCOUNTER — Ambulatory Visit (INDEPENDENT_AMBULATORY_CARE_PROVIDER_SITE_OTHER): Payer: Managed Care, Other (non HMO) | Admitting: Internal Medicine

## 2012-03-20 ENCOUNTER — Encounter: Payer: Self-pay | Admitting: Internal Medicine

## 2012-03-20 ENCOUNTER — Other Ambulatory Visit (INDEPENDENT_AMBULATORY_CARE_PROVIDER_SITE_OTHER): Payer: Managed Care, Other (non HMO)

## 2012-03-20 VITALS — BP 120/76 | HR 80 | Temp 97.9°F | Resp 14 | Ht 66.5 in | Wt 236.0 lb

## 2012-03-20 DIAGNOSIS — Z23 Encounter for immunization: Secondary | ICD-10-CM

## 2012-03-20 DIAGNOSIS — E669 Obesity, unspecified: Secondary | ICD-10-CM

## 2012-03-20 DIAGNOSIS — Z0000 Encounter for general adult medical examination without abnormal findings: Secondary | ICD-10-CM

## 2012-03-20 DIAGNOSIS — I10 Essential (primary) hypertension: Secondary | ICD-10-CM

## 2012-03-20 LAB — COMPREHENSIVE METABOLIC PANEL
ALT: 39 U/L (ref 0–53)
Albumin: 4.2 g/dL (ref 3.5–5.2)
Alkaline Phosphatase: 65 U/L (ref 39–117)
CO2: 29 mEq/L (ref 19–32)
GFR: 67.16 mL/min (ref >60.00)
Glucose, Bld: 87 mg/dL (ref 70–99)
Potassium: 3.9 mEq/L (ref 3.5–5.1)
Sodium: 138 mEq/L (ref 135–145)
Total Protein: 7.3 g/dL (ref 6.0–8.3)

## 2012-03-20 MED ORDER — HYDROCHLOROTHIAZIDE 25 MG PO TABS: 12.5000 mg | ORAL_TABLET | Freq: Every day | ORAL | Status: DC

## 2012-03-20 NOTE — Progress Notes (Signed)
Subjective:    Patient ID: Luis Knapp, male    DOB: 09-01-1950, 61 y.o.   MRN: [id number]  HPI Luis Knapp for an annual wellness exam. Interval history negative for any major medical, surgery, injury. He has seen dentist in last 12 months, he had an eye exam about a year ago. No exercise program. No sun exposure.   Past Medical History  Diagnosis Date  . Colon polyp   . Diverticulosis of colon (without mention of hemorrhage)   . Allergic rhinitis due to pollen   . Unspecified essential hypertension    Past Surgical History  Procedure Date  . No prior surgery    Family History  Problem Relation Age of Onset  . Coronary artery disease Father   . Heart attack Father     2nd one fatal  . Pulmonary embolism Father     ?  Marland Kitchen Dementia Father   . Heart disease Father   . Dementia Mother   . Hypertension Mother   . Macular degeneration Mother   . Kidney disease Mother   . Diabetes Sister   . Prostate cancer Neg Hx   . Colon cancer Neg Hx   . Rectal cancer Neg Hx    History   Social History  . Marital Status: Married    Spouse Name: N/A    Number of Children: N/A  . Years of Education: N/A   Occupational History  . 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 Smoking status: Former Smoker    Quit date: 10/16/1993  . Smokeless tobacco: Never Used  . Alcohol Use: No  . Drug Use: No  . Sexually Active: Not on file   Other Topics Concern  . Not on file   Social History Narrative   Married '77- 19 years divorced; married '02. 2 sons - '79, '80; 1 grandson. Things are ok. Work is OK - Quarry manager.     Current Outpatient Prescriptions on File Prior to Visit  Medication Sig Dispense Refill  . hydrochlorothiazide (HYDRODIURIL) 25 MG tablet TAKE 1/2 TABLET ONCE A DAY  45 tablet  1  . Multiple Vitamins-Minerals (MULTIVITAMIN PO) Take by mouth daily.      . Omega-3 Fatty Acids  (FISH OIL PO) Take by mouth daily.          Review of Systems Constitutional:  Negative for fever, chills, activity change and unexpected weight change.  HEENT:  Negative for hearing loss, ear pain, congestion, neck stiffness and postnasal drip. Negative for sore throat or swallowing problems. Negative for dental complaints.   Eyes: Negative for vision loss or change in visual acuity.  Respiratory: Negative for chest tightness and wheezing. Negative for DOE.   Cardiovascular: Negative for chest pain or palpitations. No decreased exercise tolerance Gastrointestinal: No change in bowel habit. No bloating or gas. No reflux or indigestion Genitourinary: Negative for urgency, frequency, flank pain and difficulty urinating.  Musculoskeletal: Negative for myalgias, back pain, arthralgias and gait problem.  Neurological: Negative for dizziness, tremors, weakness and headaches.  Hematological: Negative for adenopathy.  Psychiatric/Behavioral: Negative for behavioral problems and dysphoric mood.       Objective:   Physical Exam Filed Vitals:   03/20/12 1430  BP: 120/76  Pulse: 80  Temp: 97.9 F (36.6 C)  Resp: 14   Wt Readings from Last 3 Encounters:  03/20/12 236 lb (107.049 kg)  10/30/11 231 lb (104.781 kg)  10/17/11 231 lb 9.6 oz (105.053 kg)   Gen'l: Well nourished well developed white male in no acute distress  HEENT: Head: Normocephalic and atraumatic. Right Ear: External ear normal. EAC/TM nl. Left Ear: External ear normal.  EAC/TM nl. Nose: Nose normal. Mouth/Throat: Oropharynx is clear and moist. Dentition - native, in good repair. No buccal or palatal lesions. Posterior pharynx clear. Eyes: Conjunctivae and sclera clear. EOM intact. Pupils are equal, round, and reactive to light. Right eye exhibits no discharge. Left eye exhibits no discharge. Neck: Normal range of motion. Neck supple. No JVD present. No tracheal deviation present. No thyromegaly present.  Cardiovascular: Normal  rate, regular rhythm, no gallop, no friction rub, no murmur heard.      Quiet precordium. 2+ radial and DP pulses . No carotid bruits Pulmonary/Chest: Effort normal. No respiratory distress or increased WOB, no wheezes, no rales. No chest wall deformity or CVAT. Abdomen: Soft. Bowel sounds are normal in all quadrants. He exhibits no distension, no tenderness, no rebound or guarding, No heptosplenomegaly  Genitourinary:  deferred Musculoskeletal: Normal range of motion. He exhibits no edema and no tenderness.       Small and large joints without redness, synovial thickening or deformity. Full range of motion preserved about all small, median and large joints.  Lymphadenopathy:    He has no cervical or supraclavicular adenopathy.  Neurological: He is alert and oriented to person, place, and time. CN II-XII intact. DTRs 2+ and symmetrical biceps, radial and patellar tendons. Cerebellar function normal with no tremor, rigidity, normal gait and station.  Skin: Skin is warm and dry. No rash noted. No erythema.  Psychiatric: He has a normal mood and affect. His behavior is normal. Thought content normal.   Lab Results  Component Value Date   WBC 6.7 11/22/2010   HGB 14.9 11/22/2010   HCT 43.0 11/22/2010   PLT 259.0 11/22/2010   GLUCOSE 87 03/20/2012   CHOL 177 11/22/2010   TRIG 221.0* 11/22/2010   HDL 45.90 11/22/2010   LDLDIRECT 110.7 11/22/2010   LDLCALC 75 11/05/2009   ALT 39 03/20/2012   AST 26 03/20/2012   NA 138 03/20/2012   K 3.9 03/20/2012   CL 101 03/20/2012   CREATININE 1.2 03/20/2012   BUN 16 03/20/2012   CO2 29 03/20/2012   TSH 2.69 11/22/2010   PSA 3.15 11/22/2010          Assessment & Plan:

## 2012-03-20 NOTE — Patient Instructions (Addendum)
Thanks for coming in to see me.   It is in your best interest to loose weight. Weight Management: smart food choices, PORTION SIZE CONTROL, regular exercise - 30 min 3 times a week. Goal is to loose 1-2 lbs per month

## 2012-03-23 NOTE — Assessment & Plan Note (Signed)
Vitals - 1 value per visit 03/20/2012 10/30/2011 10/17/2011 12/02/2010 05/18/2010  Weight (lb) 236 231 231.6 231 227  HEIGHT 5' 6.5" 5\' 6"  5\' 6"  5' 6.5" 5\' 6"   BMI 37.53 37.3 37.4 36.73 36.66   Vitals - 1 value per visit 11/12/2009 09/15/2008 09/20/2007 08/03/2007  Weight (lb) 220 218 218 204  HEIGHT 5\' 6"  5\' 6"  5\' 6"    BMI 35.53 35.2 35.2    Discussed the importance of weight management.  Plan Smart food choices - always take the lower calorie option; PORTION SIZE CONTROL - use your hand as a guide; regular aerobic exercise - 3 times a week for 30 minutes  Target weight - 175 lbs; goal to loose 2 lbs a month ( 3 year project)

## 2012-03-23 NOTE — Assessment & Plan Note (Signed)
BP Readings from Last 3 Encounters:  03/20/12 120/76  10/30/11 123/80  12/02/10 124/78   Very good control on present regimen. Kidney function and electrolytes normal.

## 2012-03-23 NOTE — Assessment & Plan Note (Signed)
Interval history with any major illness, surgery or injury. Physical exam is unremarkable. Labs reviewed - cholesterol levels in '12 were good except for mild elevation in triglyceride and repeat lab is indicated - smart low fat choices only therapy indicated. Her is current w/ colorectal cancer screening. Discussed pros and cons of prostate cancer screening (USPHCTF recommendations reviewed and ACU April '13 recommendations) and he defers evaluation at this time in light of normal PSA the last 5 years without abnormal acceleration. Immunizations - due for pneumonia and shingles vaccine.  IN summary - a nice man who appears to be medically stable. He will return in 1 year or sooner as needed.

## 2012-06-29 ENCOUNTER — Other Ambulatory Visit: Payer: Self-pay

## 2013-03-20 ENCOUNTER — Other Ambulatory Visit: Payer: Self-pay

## 2013-05-21 ENCOUNTER — Encounter: Payer: Self-pay | Admitting: Internal Medicine

## 2013-05-21 ENCOUNTER — Ambulatory Visit (INDEPENDENT_AMBULATORY_CARE_PROVIDER_SITE_OTHER): Payer: 59 | Admitting: Internal Medicine

## 2013-05-21 VITALS — BP 158/90 | HR 82 | Temp 98.3°F | Wt 245.0 lb

## 2013-05-21 DIAGNOSIS — N5089 Other specified disorders of the male genital organs: Secondary | ICD-10-CM

## 2013-05-21 NOTE — Progress Notes (Signed)
Pre visit review using our clinic review tool, if applicable. No additional management support is needed unless otherwise documented below in the visit note. 

## 2013-05-21 NOTE — Patient Instructions (Signed)
Scrotal swelling - painless swelling of the right scrotum. On exam there is minimal testicular tenderness but most a soft mass in the scrotum: descended colon from an inguinal hernia vs hydrocele vs mas.  Plan Ultrasound of the scrotum with recommendations to follow.

## 2013-05-21 NOTE — Progress Notes (Signed)
   Subjective:    Patient ID: Luis Knapp, male    DOB: 03-07-1951, 63 y.o.   MRN: [id number]  HPI Luis Knapp presents with a 4-6 week h/o swelling in the right scrotum. He denies pain. He has not had an injury. No night sweats or weight loss. No lower abdominal pain.  Past Medical History  Diagnosis Date  . Colon polyp   . Diverticulosis of colon (without mention of hemorrhage)   . Allergic rhinitis due to pollen   . Unspecified essential hypertension    Past Surgical History  Procedure Laterality Date  . No prior surgery     Family History  Problem Relation Age of Onset  . Coronary artery disease Father   . Heart attack Father     2nd one fatal  . Pulmonary embolism Father     ?  Marland Kitchen Dementia Father   . Heart disease Father   . Dementia Mother   . Hypertension Mother   . Macular degeneration Mother   . Kidney disease Mother   . Diabetes Sister   . Prostate cancer Neg Hx   . Colon cancer Neg Hx   . Rectal cancer Neg Hx    History   Social History  . Marital Status: Married    Spouse Name: N/A    Number of Children: N/A  . Years of Education: N/A   Occupational History  . 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 Smoking status: Former Smoker    Quit date: 10/16/1993  . Smokeless tobacco: Never Used  . Alcohol Use: No  . Drug Use: No  . Sexual Activity: Not on file   Other Topics Concern  . Not on file   Social History Narrative   Married '77- 19 years divorced; married '02. 2 sons - '79, '80; 1 grandson. Things are ok. Work is OK - Dance movement psychotherapist.     Current Outpatient Prescriptions on File Prior to Visit  Medication Sig Dispense Refill  . hydrochlorothiazide (HYDRODIURIL) 25 MG tablet Take 0.5 tablets (12.5 mg total) by mouth daily.  45 tablet  3  . Multiple Vitamins-Minerals (MULTIVITAMIN PO) Take by mouth daily.       No current facility-administered medications  on file prior to visit.      Review of Systems System review is negative for any constitutional, cardiac, pulmonary, GI or neuro symptoms or complaints other than as described in the HPI.     Objective:   Physical Exam Filed Vitals:   05/21/13 1112  BP: 158/90  Pulse: 82  Temp: 98.3 F (36.8 C)   Gen'l- WNWD man Genitalia - normal shaft and glans penis. Right scrotum is swollen/full with soft tissue. The testicle cannot be palpated fully but there is some tenderness. Cannot enter the inguinal canal with digit. The area is not tender.         Assessment & Plan:  Scrotal swelling - painless swelling of the right scrotum. On exam there is minimal testicular tenderness but most a soft mass in the scrotum: descended colon from an inguinal hernia vs hydrocele vs mas.  Plan Ultrasound of the scrotum with recommendations to follow.

## 2013-05-23 ENCOUNTER — Ambulatory Visit (HOSPITAL_COMMUNITY)
Admission: RE | Admit: 2013-05-23 | Discharge: 2013-05-23 | Disposition: A | Discharge status: Home / Self Care | Payer: 59 | Source: Ambulatory Visit | Attending: Internal Medicine | Admitting: Internal Medicine

## 2013-05-23 DIAGNOSIS — N508 Other specified disorders of male genital organs: Secondary | ICD-10-CM | POA: Insufficient documentation

## 2013-05-23 DIAGNOSIS — N5089 Other specified disorders of the male genital organs: Secondary | ICD-10-CM

## 2013-05-23 DIAGNOSIS — N433 Hydrocele, unspecified: Secondary | ICD-10-CM | POA: Insufficient documentation

## 2013-06-23 ENCOUNTER — Encounter: Payer: Self-pay | Admitting: Internal Medicine

## 2013-06-23 ENCOUNTER — Other Ambulatory Visit: Payer: 59

## 2013-06-23 ENCOUNTER — Other Ambulatory Visit (INDEPENDENT_AMBULATORY_CARE_PROVIDER_SITE_OTHER): Payer: 59

## 2013-06-23 ENCOUNTER — Ambulatory Visit (INDEPENDENT_AMBULATORY_CARE_PROVIDER_SITE_OTHER): Payer: 59 | Admitting: Internal Medicine

## 2013-06-23 VITALS — BP 142/78 | HR 85 | Temp 98.6°F | Ht 66.0 in | Wt 232.8 lb

## 2013-06-23 DIAGNOSIS — N433 Hydrocele, unspecified: Secondary | ICD-10-CM

## 2013-06-23 DIAGNOSIS — Z0000 Encounter for general adult medical examination without abnormal findings: Secondary | ICD-10-CM

## 2013-06-23 DIAGNOSIS — Z125 Encounter for screening for malignant neoplasm of prostate: Secondary | ICD-10-CM

## 2013-06-23 DIAGNOSIS — I10 Essential (primary) hypertension: Secondary | ICD-10-CM

## 2013-06-23 DIAGNOSIS — Z23 Encounter for immunization: Secondary | ICD-10-CM

## 2013-06-23 DIAGNOSIS — E669 Obesity, unspecified: Secondary | ICD-10-CM

## 2013-06-23 LAB — LIPID PANEL
Cholesterol: 155 mg/dL (ref 0–200)
HDL: 43.5 mg/dL (ref >39.00)
LDL Cholesterol: 88 mg/dL (ref 0–99)
TRIGLYCERIDES: 116 mg/dL (ref 0.0–149.0)
Total CHOL/HDL Ratio: 4
VLDL: 23.2 mg/dL (ref 0.0–40.0)

## 2013-06-23 LAB — BASIC METABOLIC PANEL
BUN: 12 mg/dL (ref 6–23)
CALCIUM: 9.2 mg/dL (ref 8.4–10.5)
CO2: 25 mEq/L (ref 19–32)
CREATININE: 1 mg/dL (ref 0.4–1.5)
Chloride: 102 mEq/L (ref 96–112)
GFR: 76.63 mL/min (ref >60.00)
Glucose, Bld: 95 mg/dL (ref 70–99)
Potassium: 3.8 mEq/L (ref 3.5–5.1)
Sodium: 138 mEq/L (ref 135–145)

## 2013-06-23 LAB — PSA: PSA: 4.5 ng/mL — ABNORMAL HIGH (ref 0.10–4.00)

## 2013-06-23 NOTE — Patient Instructions (Signed)
Thanks for coming in  Your exam is ok except for your weight and the hydrocele seen at the January visit.  Will refer you to urology for evaluation and treatment of the hydrocele. You should hear about an appointment in 3-4 days. If you don't hear anything please let me know via MyChart.  Weight is your primary health risk. Plan Diet management: smart food choices, PORTION SIZE CONTROL, regular exercise. Goal - to loose 1-2 lbs.month. Target weight - 200 lbs  Immunizations - will give Prevnar Pneumonia vaccine today - once and done. You will need a Pneumovax booster in 2018. You do noed shingles vaccine.

## 2013-06-23 NOTE — Progress Notes (Signed)
Pre visit review using our clinic review tool, if applicable. No additional management support is needed unless otherwise documented below in the visit note. 

## 2013-06-23 NOTE — Progress Notes (Signed)
Subjective:    Patient ID: Luis Knapp, male    DOB: 02/10/1951, 63 y.o.   MRN: [id number]  HPI Luis Knapp presents for a wellness exam. He was seen in early Jan for scrotal swelling right. He has had an U/S revealing large right hydrocele. This is continuing to enlarge and is now uncomfortable.  He has otherwise been well and doing well. He is current with dental care. He has not seen an eye doctor for several years. Follows a healthy diet to his best ability. He does not have a regular exercise program and is encouraged to do this. Home and work life are [place].   Past Medical History  Diagnosis Date  . Colon polyp   . Diverticulosis of colon (without mention of hemorrhage)   . Allergic rhinitis due to pollen   . Unspecified essential hypertension    Past Surgical History  Procedure Laterality Date  . No prior surgery     Family History  Problem Relation Age of Onset  . Coronary artery disease Father   . Heart attack Father     2nd one fatal  . Pulmonary embolism Father     ?  Marland Kitchen Dementia Father   . Heart disease Father   . Dementia Mother   . Hypertension Mother   . Macular degeneration Mother   . Kidney disease Mother   . Diabetes Sister   . Prostate cancer Neg Hx   . Colon cancer Neg Hx   . Rectal cancer Neg Hx    History   Social History  . Marital Status: Married    Spouse Name: N/A    Number of Children: N/A  . Years of Education: N/A   Occupational History  . 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 [employer]ing firm     [place] History Main Topics  . Smoking status: Former Smoker    Quit date: 10/16/1993  . Smokeless tobacco: Never Used  . Alcohol Use: No  . Drug Use: No  . Sexual Activity: Not on file   Other Topics Concern  . Not on file   Social History Narrative   Married '77- 19 years divorced; married '02. 2 sons - '79, '80; 1 grandson. Things are ok. Work is OK - Dance movement psychotherapist.       Current Outpatient  Prescriptions on File Prior to Visit  Medication Sig Dispense Refill  . hydrochlorothiazide (HYDRODIURIL) 25 MG tablet Take 0.5 tablets (12.5 mg total) by mouth daily.  45 tablet  3  . Multiple Vitamins-Minerals (MULTIVITAMIN PO) Take by mouth daily.       No current facility-administered medications on file prior to visit.      Review of Systems Constitutional:  Negative for fever, chills, activity change and unexpected weight change.  HEENT:  Negative for hearing loss, ear pain, congestion, neck stiffness and postnasal drip. Negative for sore throat or swallowing problems. Negative for dental complaints.   Eyes: Negative for vision loss or change in visual acuity.  Respiratory: Negative for chest tightness and wheezing. Negative for DOE.   Cardiovascular: Negative for chest pain or palpitations. No decreased exercise tolerance Gastrointestinal: No change in bowel habit. No bloating or gas. No reflux or indigestion Genitourinary: Negative for urgency, frequency, flank pain and difficulty urinating.  Musculoskeletal: Negative for myalgias, back pain, arthralgias and gait problem.  Neurological: Negative for dizziness, tremors, weakness and headaches.  Hematological: Negative for adenopathy.  Psychiatric/Behavioral: Negative for  behavioral problems and dysphoric mood.       Objective:   Physical Exam Filed Vitals:   06/23/13 1335  BP: 142/78  Pulse: 85  Temp: 98.6 F (37 C)   Wt Readings from Last 3 Encounters:  06/23/13 232 lb 12.8 oz (105.597 kg)  05/21/13 245 lb (111.131 kg)  03/20/12 236 lb (107.049 kg)   Gen'l: Well nourished well developed overweight male in no acute distress  HEENT: Head: Normocephalic and atraumatic. Right Ear: External ear normal. EAC/TM nl. Left Ear: External ear normal.  EAC/TM nl. Nose: Nose normal. Mouth/Throat: Oropharynx is clear and moist. Dentition - native, in good repair. No buccal or palatal lesions. Posterior pharynx clear. Eyes:  Conjunctivae and sclera clear. EOM intact. Pupils are equal, round, and reactive to light. Right eye exhibits no discharge. Left eye exhibits no discharge. Neck: Normal range of motion. Neck supple. No JVD present. No tracheal deviation present. No thyromegaly present.  Cardiovascular: Normal rate, regular rhythm, no gallop, no friction rub, no murmur heard.      Quiet precordium. 2+ radial and DP pulses . No carotid bruits Pulmonary/Chest: Effort normal. No respiratory distress or increased WOB, no wheezes, no rales. No chest wall deformity or CVAT. Abdomen: Soft. Bowel sounds are normal in all quadrants. He exhibits no distension, no tenderness, no rebound or guarding, No heptosplenomegaly  Genitourinary:  deferred to exam Jan 7 th. Musculoskeletal: Normal range of motion. He exhibits no edema and no tenderness.       Small and large joints without redness, synovial thickening or deformity. Full range of motion preserved about all small, median and large joints.  Lymphadenopathy:    He has no cervical or supraclavicular adenopathy.  Neurological: He is alert and oriented to person, place, and time. CN II-XII intact. DTRs 2+ and symmetrical biceps, radial and patellar tendons. Cerebellar function normal with no tremor, rigidity, normal gait and station.  Skin: Skin is warm and dry. No rash noted. No erythema.  Psychiatric: He has a normal mood and affect. His behavior is normal. Thought content normal.   Recent Results (from the past 2160 hour(s))  PSA     Status: Abnormal   Collection Time    06/23/13  2:38 PM      Result Value Range   PSA 4.50 (*) 0.10 - 4.00 ng/mL  BASIC METABOLIC PANEL     Status: None   Collection Time    06/23/13  2:38 PM      Result Value Range   Sodium 138  135 - 145 mEq/L   Potassium 3.8  3.5 - 5.1 mEq/L   Chloride 102  96 - 112 mEq/L   CO2 25  19 - 32 mEq/L   Glucose, Bld 95  70 - 99 mg/dL   BUN 12  6 - 23 mg/dL   Creatinine, Ser 1.0  0.4 - 1.5 mg/dL    Calcium 9.2  8.4 - 10.5 mg/dL   GFR 76.63  >60.00 mL/min  LIPID PANEL     Status: None   Collection Time    06/23/13  2:38 PM      Result Value Range   Cholesterol 155  0 - 200 mg/dL   Comment: ATP III Classification       Desirable:  < 200 mg/dL               Borderline High:  200 - 239 mg/dL          High:  > =  240 mg/dL   Triglycerides 116.0  0.0 - 149.0 mg/dL   Comment: Normal:  <150 mg/dLBorderline High:  150 - 199 mg/dL   HDL 43.50  >39.00 mg/dL   VLDL 23.2  0.0 - 40.0 mg/dL   LDL Cholesterol 88  0 - 99 mg/dL   Total CHOL/HDL Ratio 4     Comment:                Men          Women1/2 Average Risk     3.4          3.3Average Risk          5.0          4.42X Average Risk          9.6          7.13X Average Risk          15.0          11.0                              Assessment & Plan:

## 2013-06-24 DIAGNOSIS — N433 Hydrocele, unspecified: Secondary | ICD-10-CM | POA: Insufficient documentation

## 2013-06-24 NOTE — Assessment & Plan Note (Signed)
BP Readings from Last 3 Encounters:  06/23/13 142/78  05/21/13 158/90  03/20/12 120/76   Adequate control on diuretic therapy.  Plan Continue present medication  Weight loss

## 2013-06-24 NOTE — Assessment & Plan Note (Signed)
Enlarging hydrocele right which is causing discomfort.  Plan Refer to Urology

## 2013-06-24 NOTE — Assessment & Plan Note (Signed)
Chronic obesity. Reemphasized the major health risk associated with his obesity. Strongly recommended weight management  Plan Diet management: smart food choices, PORTION SIZE CONTROL, regular exercise. Goal - to loose 1-2 lbs.month. Target weight - 200 lbs  Consider joining weight watchers.

## 2013-06-24 NOTE — Assessment & Plan Note (Signed)
Interval history w/o major illness, injury or surgery. Physical exam notable for obesity. Labs with normal PSA, normal Bmet, Lipid panel with LDL 88. He is current with colorectal cancer screening and PSA done today is normal range. Immunizations - given prevnar and will need pnuemovax and shingles vaccine.  In summary  a nice man who will be referred to urology for hydrocele and who is adamantly encouraged to gain control of his weight.

## 2013-07-14 ENCOUNTER — Other Ambulatory Visit: Payer: Self-pay | Admitting: Urology

## 2013-07-15 ENCOUNTER — Encounter (HOSPITAL_BASED_OUTPATIENT_CLINIC_OR_DEPARTMENT_OTHER): Payer: Self-pay | Admitting: *Deleted

## 2013-07-16 ENCOUNTER — Encounter (HOSPITAL_BASED_OUTPATIENT_CLINIC_OR_DEPARTMENT_OTHER): Payer: Self-pay | Admitting: *Deleted

## 2013-07-16 NOTE — Progress Notes (Signed)
NPO AFTER MN.  ARRIVE AT 0600.  NEEDS ISTAT AND EKG.  

## 2013-07-16 NOTE — Progress Notes (Signed)
07/16/13 1421  OBSTRUCTIVE SLEEP APNEA  Have you ever been diagnosed with sleep apnea through a sleep study? No  Do you snore loudly (loud enough to be heard through closed doors)?  1  Do you often feel tired, fatigued, or sleepy during the daytime? 0  Has anyone observed you stop breathing during your sleep? 0  Do you have, or are you being treated for high blood pressure? 1  BMI more than 35 kg/m2? 1  Age over 64 years old? 1  Neck circumference greater than 40 cm/18 inches? 0  Gender: 1  Obstructive Sleep Apnea Score 5  Score 4 or greater  Results sent to PCP

## 2013-07-17 NOTE — Anesthesia Preprocedure Evaluation (Signed)
Anesthesia Evaluation  Patient identified by MRN, date of birth, ID band Patient awake    Reviewed: Allergy & Precautions, H&P , NPO status , Patient's Chart, lab work & pertinent test results  Airway Mallampati: II TM Distance: >3 FB     Dental  (+) Dental Advisory Given   Pulmonary neg pulmonary ROS, former smoker,          Cardiovascular hypertension, Pt. on medications negative cardio ROS      Neuro/Psych negative neurological ROS  negative psych ROS   GI/Hepatic Neg liver ROS, GERD-  ,  Endo/Other  negative endocrine ROS  Renal/GU negative Renal ROS     Musculoskeletal negative musculoskeletal ROS (+)   Abdominal (+) + obese,   Peds  Hematology negative hematology ROS (+)   Anesthesia Other Findings   Reproductive/Obstetrics                           Anesthesia Physical Anesthesia Plan  ASA: II  Anesthesia Plan: General   Post-op Pain Management:    Induction: Intravenous  Airway Management Planned: LMA  Additional Equipment:   Intra-op Plan:   Post-operative Plan: Extubation in OR  Informed Consent: I have reviewed the patients History and Physical, chart, labs and discussed the procedure including the risks, benefits and alternatives for the proposed anesthesia with the patient or authorized representative who has indicated his/her understanding and acceptance.   Dental advisory given  Plan Discussed with: CRNA  Anesthesia Plan Comments:         Anesthesia Quick Evaluation

## 2013-07-18 ENCOUNTER — Encounter (HOSPITAL_BASED_OUTPATIENT_CLINIC_OR_DEPARTMENT_OTHER)
Admission: RE | Disposition: A | Discharge status: Home / Self Care | Payer: Self-pay | Source: Ambulatory Visit | Attending: Urology

## 2013-07-18 ENCOUNTER — Ambulatory Visit (HOSPITAL_BASED_OUTPATIENT_CLINIC_OR_DEPARTMENT_OTHER): Payer: 59 | Admitting: Anesthesiology

## 2013-07-18 ENCOUNTER — Encounter (HOSPITAL_BASED_OUTPATIENT_CLINIC_OR_DEPARTMENT_OTHER): Payer: 59 | Admitting: Anesthesiology

## 2013-07-18 ENCOUNTER — Ambulatory Visit (HOSPITAL_BASED_OUTPATIENT_CLINIC_OR_DEPARTMENT_OTHER)
Admission: RE | Admit: 2013-07-18 | Discharge: 2013-07-18 | Disposition: A | Discharge status: Home / Self Care | Payer: 59 | Source: Ambulatory Visit | Attending: Urology | Admitting: Urology

## 2013-07-18 ENCOUNTER — Encounter (HOSPITAL_BASED_OUTPATIENT_CLINIC_OR_DEPARTMENT_OTHER): Payer: Self-pay | Admitting: *Deleted

## 2013-07-18 DIAGNOSIS — E669 Obesity, unspecified: Secondary | ICD-10-CM | POA: Insufficient documentation

## 2013-07-18 DIAGNOSIS — K219 Gastro-esophageal reflux disease without esophagitis: Secondary | ICD-10-CM | POA: Insufficient documentation

## 2013-07-18 DIAGNOSIS — I10 Essential (primary) hypertension: Secondary | ICD-10-CM | POA: Insufficient documentation

## 2013-07-18 DIAGNOSIS — Z87891 Personal history of nicotine dependence: Secondary | ICD-10-CM | POA: Insufficient documentation

## 2013-07-18 DIAGNOSIS — Z79899 Other long term (current) drug therapy: Secondary | ICD-10-CM | POA: Insufficient documentation

## 2013-07-18 DIAGNOSIS — N433 Hydrocele, unspecified: Secondary | ICD-10-CM | POA: Insufficient documentation

## 2013-07-18 HISTORY — DX: Hydrocele, unspecified: N43.3

## 2013-07-18 HISTORY — DX: Personal history of colon polyps, unspecified: Z86.0100

## 2013-07-18 HISTORY — PX: HYDROCELE EXCISION: SHX482

## 2013-07-18 HISTORY — DX: Personal history of colonic polyps: Z86.010

## 2013-07-18 HISTORY — DX: Gastro-esophageal reflux disease without esophagitis: K21.9

## 2013-07-18 HISTORY — DX: Presence of spectacles and contact lenses: Z97.3

## 2013-07-18 LAB — POCT I-STAT 4, (NA,K, GLUC, HGB,HCT)
GLUCOSE: 115 mg/dL — AB (ref 70–99)
HCT: 48 % (ref 39.0–52.0)
Hemoglobin: 16.3 g/dL (ref 13.0–17.0)
POTASSIUM: 3.8 meq/L (ref 3.7–5.3)
Sodium: 141 mEq/L (ref 137–147)

## 2013-07-18 SURGERY — HYDROCELECTOMY
Anesthesia: General | Laterality: Right

## 2013-07-18 MED ORDER — DOCUSATE SODIUM 100 MG PO CAPS: 100.0000 mg | ORAL_CAPSULE | Freq: Two times a day (BID) | ORAL | Status: DC | PRN

## 2013-07-18 MED ORDER — DEXAMETHASONE SODIUM PHOSPHATE 4 MG/ML IJ SOLN
INTRAMUSCULAR | Status: DC | PRN
  Administered 2013-07-18: 10 mg via INTRAVENOUS

## 2013-07-18 MED ORDER — CEFAZOLIN SODIUM-DEXTROSE 2-3 GM-% IV SOLR
2.0000 g | INTRAVENOUS | Status: AC
  Administered 2013-07-18: 2 g via INTRAVENOUS
  Filled 2013-07-18: qty 50

## 2013-07-18 MED ORDER — MIDAZOLAM HCL 2 MG/2ML IJ SOLN
INTRAMUSCULAR | Status: AC
  Filled 2013-07-18: qty 2

## 2013-07-18 MED ORDER — CEFAZOLIN SODIUM 1-5 GM-% IV SOLN
1.0000 g | INTRAVENOUS | Status: DC
  Filled 2013-07-18: qty 50

## 2013-07-18 MED ORDER — LIDOCAINE HCL (CARDIAC) 20 MG/ML IV SOLN
INTRAVENOUS | Status: DC | PRN
  Administered 2013-07-18: 60 mg via INTRAVENOUS

## 2013-07-18 MED ORDER — FENTANYL CITRATE 0.05 MG/ML IJ SOLN
INTRAMUSCULAR | Status: DC | PRN
Start: 2013-07-18 — End: 2013-07-18
  Administered 2013-07-18: 50 ug via INTRAVENOUS
  Administered 2013-07-18: 25 ug via INTRAVENOUS
  Administered 2013-07-18: 50 ug via INTRAVENOUS
  Administered 2013-07-18 (×3): 25 ug via INTRAVENOUS

## 2013-07-18 MED ORDER — LACTATED RINGERS IV SOLN
INTRAVENOUS | Status: DC
  Administered 2013-07-18 (×2): via INTRAVENOUS
  Filled 2013-07-18: qty 1000

## 2013-07-18 MED ORDER — MIDAZOLAM HCL 5 MG/5ML IJ SOLN
INTRAMUSCULAR | Status: DC | PRN
  Administered 2013-07-18: 2 mg via INTRAVENOUS

## 2013-07-18 MED ORDER — BUPIVACAINE HCL (PF) 0.25 % IJ SOLN
INTRAMUSCULAR | Status: DC | PRN
  Administered 2013-07-18: 15 mL

## 2013-07-18 MED ORDER — OXYCODONE HCL 5 MG PO TABS: 5.0000 mg | ORAL_TABLET | ORAL | Status: DC | PRN

## 2013-07-18 MED ORDER — FENTANYL CITRATE 0.05 MG/ML IJ SOLN
INTRAMUSCULAR | Status: AC
  Filled 2013-07-18: qty 6

## 2013-07-18 MED ORDER — ONDANSETRON HCL 4 MG/2ML IJ SOLN
INTRAMUSCULAR | Status: DC | PRN
  Administered 2013-07-18: 4 mg via INTRAVENOUS

## 2013-07-18 MED ORDER — PROPOFOL 10 MG/ML IV BOLUS
INTRAVENOUS | Status: DC | PRN
  Administered 2013-07-18: 200 mg via INTRAVENOUS

## 2013-07-18 SURGICAL SUPPLY — 49 items
ADH SKN CLS APL DERMABOND .7 (GAUZE/BANDAGES/DRESSINGS) ×1
BANDAGE GAUZE ELAST BULKY 4 IN (GAUZE/BANDAGES/DRESSINGS) ×2 IMPLANT
BLADE SURG 15 STRL LF DISP TIS (BLADE) ×1 IMPLANT
BLADE SURG 15 STRL SS (BLADE) ×3
BLADE SURG ROTATE 9660 (MISCELLANEOUS) ×2 IMPLANT
BNDG GAUZE ELAST 4 BULKY (GAUZE/BANDAGES/DRESSINGS) ×3 IMPLANT
BRIEF STRETCH FOR OB PAD LRG (UNDERPADS AND DIAPERS) ×3 IMPLANT
CANISTER SUCTION 1200CC (MISCELLANEOUS) IMPLANT
CANISTER SUCTION 2500CC (MISCELLANEOUS) ×2 IMPLANT
CLEANER CAUTERY TIP 5X5 PAD (MISCELLANEOUS) ×1 IMPLANT
CLOTH BEACON ORANGE TIMEOUT ST (SAFETY) ×2 IMPLANT
COVER MAYO STAND STRL (DRAPES) ×3 IMPLANT
COVER TABLE BACK 60X90 (DRAPES) ×3 IMPLANT
DERMABOND ADVANCED (GAUZE/BANDAGES/DRESSINGS) ×2
DERMABOND ADVANCED .7 DNX12 (GAUZE/BANDAGES/DRESSINGS) ×1 IMPLANT
DISSECTOR ROUND CHERRY 3/8 STR (MISCELLANEOUS) IMPLANT
DRAIN PENROSE 18X1/4 LTX STRL (WOUND CARE) IMPLANT
DRAPE PED LAPAROTOMY (DRAPES) ×3 IMPLANT
ELECT NDL BLADE 2-5/6 (NEEDLE) ×1 IMPLANT
ELECT NDL TIP 2.8 STRL (NEEDLE) IMPLANT
ELECT NEEDLE BLADE 2-5/6 (NEEDLE) ×3 IMPLANT
ELECT NEEDLE TIP 2.8 STRL (NEEDLE) ×3 IMPLANT
ELECT REM PT RETURN 9FT ADLT (ELECTROSURGICAL) ×3
ELECTRODE REM PT RTRN 9FT ADLT (ELECTROSURGICAL) ×1 IMPLANT
GAUZE SPONGE 4X4 16PLY XRAY LF (GAUZE/BANDAGES/DRESSINGS) IMPLANT
GLOVE BIO SURGEON STRL SZ7 (GLOVE) ×2 IMPLANT
GLOVE BIO SURGEON STRL SZ7.5 (GLOVE) ×3 IMPLANT
GLOVE BIOGEL PI IND STRL 7.5 (GLOVE) IMPLANT
GLOVE BIOGEL PI INDICATOR 7.5 (GLOVE) ×4
GOWN PREVENTION PLUS LG XLONG (DISPOSABLE) ×1 IMPLANT
GOWN STRL REIN XL XLG (GOWN DISPOSABLE) ×5 IMPLANT
NEEDLE HYPO 22GX1.5 SAFETY (NEEDLE) IMPLANT
NS IRRIG 500ML POUR BTL (IV SOLUTION) ×3 IMPLANT
PACK BASIN DAY SURGERY FS (CUSTOM PROCEDURE TRAY) ×3 IMPLANT
PAD CLEANER CAUTERY TIP 5X5 (MISCELLANEOUS) ×2
PENCIL BUTTON HOLSTER BLD 10FT (ELECTRODE) ×3 IMPLANT
SUT MNCRL AB 4-0 PS2 18 (SUTURE) ×2 IMPLANT
SUT VIC AB 3-0 SH 27 (SUTURE) ×6
SUT VIC AB 3-0 SH 27X BRD (SUTURE) ×2 IMPLANT
SUT VICRYL 3 0 BR 18  UND (SUTURE) ×2
SUT VICRYL 3 0 BR 18 UND (SUTURE) ×1 IMPLANT
SYR BULB IRRIGATION 50ML (SYRINGE) ×3 IMPLANT
SYR CONTROL 10ML LL (SYRINGE) ×3 IMPLANT
TOWEL OR 17X24 6PK STRL BLUE (TOWEL DISPOSABLE) ×6 IMPLANT
TRAY DSU PREP LF (CUSTOM PROCEDURE TRAY) ×3 IMPLANT
TUBE CONNECTING 12'X1/4 (SUCTIONS) ×1
TUBE CONNECTING 12X1/4 (SUCTIONS) ×2 IMPLANT
WATER STERILE IRR 500ML POUR (IV SOLUTION) ×1 IMPLANT
YANKAUER SUCT BULB TIP NO VENT (SUCTIONS) ×3 IMPLANT

## 2013-07-18 NOTE — Interval H&P Note (Signed)
History and Physical Interval Note:  07/18/2013 7:28 AM  Luis Knapp  has presented today for surgery, with the diagnosis of [name]  The various methods of treatment have been discussed with the patient and family. After consideration of risks, benefits and other options for treatment, the patient has consented to  Procedure(s): RIGHT HYDROCELECTOMY ADULT (Right) as a surgical intervention .  The patient's history has been reviewed, patient examined, no change in status, stable for surgery.  I have reviewed the patient's chart and labs.  Questions were answered to the patient's satisfaction.     [name] W

## 2013-07-18 NOTE — Anesthesia Postprocedure Evaluation (Signed)
Anesthesia Post Note  Patient: Luis Knapp  Procedure(s) Performed: Procedure(s) (LRB): RIGHT HYDROCELECTOMY ADULT (Right)  Anesthesia type: General  Patient location: PACU  Post pain: Pain level controlled  Post assessment: Post-op Vital signs reviewed  Last Vitals: BP 152/87  Pulse 77  Temp(Src) 36.1 C (Oral)  Resp 18  Ht 5\' 6"  (1.676 m)  Wt 231 lb (104.781 kg)  BMI 37.30 kg/m2  SpO2 96%  Post vital signs: Reviewed  Level of consciousness: sedated  Complications: No apparent anesthesia complications

## 2013-07-18 NOTE — Op Note (Signed)
Preoperative diagnosis:  1. right  hydrocele   Postoperative diagnosis:  1. right  hydrocele  Procedure:       right  hydrocelectomy  Surgeon: Ardis Hughs, MD  Anesthesia: General  Complications: None  Intraoperative findings: 250 cc of straw colored fluid was aspirated from hydrocele sac  EBL: Minimal  Specimens: right hydrocele sac  Indication: Indication: Luis Knapp is a 63 y.o. patient with symptomatic right hydrocele.  After reviewing the management options for treatment, he elected to proceed with the above surgical procedure(s). We have discussed the potential benefits and risks of the procedure, side effects of the proposed treatment, the likelihood of the patient achieving the goals of the procedure, and any potential problems that might occur during the procedure or recuperation. Informed consent has been obtained.  Description of procedure:  The patient was taken to the operating room and general anesthesia was induced. The patient was placed on the table in supine position, general anesthesia was then induced and an LMA inserted. The scrotum was then prepped and draped in the routine sterile fashion. A timeout was then held with confirmation of antibiotics.  I then made a midline incision through the scrotal median raphae through the skin and into the dartos. Once through several layers the dartos was able to get the right testicle and contents out of the right  hemiscrotum and into the surgical field.   The hydrocele sac was then dissected out removing the overlying layers of the dartos tunica.  The sac was then opened with Metzenbaum scissors and the fluid drained from the sac.  The opening was then continued so that the entire sac was bivalved.  The edges were then removed, leaving a small edge of tissue around the testicle.  The edge of the remaining sac was cauterized.  The edge was then everted, brought together around the posterior aspect of the testicle  and closed in a running fashion using 3-0  Vicryl.  The distal aspect was left open to prevent strangulating the cord.  Meticulous hemostasis was then achieved. The right hemiscrotum was then copiously irrigated and a final check for hemostasis performed. I then closed the dartos with a 3-0 Vicryl in a running/locking stitch. I closed the skin with a 4-0 Monocryl in a vertical mattress running fashion. I then injected 10 cc of quarter percent Marcaine into the incision, and then placed Dermabond over the incision. A fluff dressing and mesh underpants were then applied area.  The patient tolerated the procedure without any perioperative complications. At the end of the case all last needles and sponges had been accounted for. The patient was returned to the PACU in excellent condition.   Ardis Hughs, M.D.

## 2013-07-18 NOTE — H&P (Signed)
Reason For Visit right hydrocele   History of Present Illness 63M referred by Dr. Read Drivers, MD for evaluation and management of symptomatic right hydrocele.  Patient has had this for some time now - started to daughter him in October 2014.. It has continued to enlarge. It is now bothering him. It is interferring with life. He had an ultrasound of his scrotum in 05/2013 which confirmed a right hydrocele without evidene of any other pathology. PAtient denies any epididymal orchitis episodes or testicular trauma. He has not family history of bleeding disorders. Patient has had no surgeries before.     Past Medical History Problems  1. History of hypertension (V12.59)  Surgical History Problems  1. History of No Surgical Problems  Current Meds 1. Hydrochlorothiazide TABS;  Therapy: (Recorded:02Mar2015) to Recorded  Allergies Medication  1. No Known Drug Allergies  Family History Problems  1. Family history of myocardial infarction (V17.3) : Father 2. Family history of renal failure (V18.69) : Mother  Social History Problems    Denied: History of Alcohol use   Caffeine use (V49.89)   Death in the family, father   age 63 heart attack   Death in the family, mother   age 63 kidney failure   Former smoker (V15.82)   smoked for 25 years quit 1997   Married   Occupation   Dance movement psychotherapist   Two children  Review of Systems Genitourinary, constitutional, skin, eye, otolaryngeal, hematologic/lymphatic, cardiovascular, pulmonary, endocrine, musculoskeletal, gastrointestinal, neurological and psychiatric system(s) were reviewed and pertinent findings if present are noted.    Vitals Vital Signs [Data Includes: Last 1 Day]  Recorded: [id number] 10:33AM  Height: 5 ft 6.5 in Weight: 235 lb  BMI Calculated: 37.36 BSA Calculated: 2.15 Blood Pressure: 159 / 88 Temperature: 98.5 F Heart Rate: 80  Physical Exam Constitutional: Well nourished and well developed .  No acute distress.  ENT:. The ears and nose are normal in appearance.  Neck: The appearance of the neck is normal and no neck mass is present.  Pulmonary: No respiratory distress, normal respiratory rhythm and effort and clear bilateral breath sounds.  Cardiovascular: Heart rate and rhythm are normal . No obvious murmurs are appreciated.  Abdomen: The abdomen is mildly obese.  Genitourinary: Examination of the penis demonstrates no lesions and a normal meatus. The penis is uncircumcised. Examination of the right scrotum demonstrates a hydrocele. Examination of the left scrotum demostrates no hydrocele. The right testis is nonpalpable. The left testis is normal.  Lymphatics: The femoral and inguinal nodes are not enlarged or tender.  Skin: Normal skin turgor, no visible rash and no visible skin lesions.  Neuro/Psych:. Mood and affect are appropriate.    Results/Data Urine [Data Includes: Last 1 Day]   [id number]  COLOR YELLOW   APPEARANCE CLEAR   SPECIFIC GRAVITY 1.020   pH 6.0   GLUCOSE NEG mg/dL  BILIRUBIN NEG   KETONE NEG mg/dL  BLOOD NEG   PROTEIN NEG mg/dL  UROBILINOGEN 0.2 mg/dL  NITRITE NEG   LEUKOCYTE ESTERASE NEG    The following images/tracing/specimen were independently visualized:  Scrotal ultrasound 05/23/13: IMPRESSION: Large right and tiny left hydroceles.   Tiny cyst within head of left epididymis question epididymal cyst versus spermatocele.   No evidence of testicular mass.     Assessment Assessed  1. Hydrocele of testis (603.9)  right symptomatic hydrocele   Plan Health Maintenance  1. UA With REFLEX; [Do Not Release]; Status:Complete;   Done: [id number] 10:23AM Hydrocele of testis  2. Follow-up Schedule Surgery Office  Follow-up - right hydrocelectomy  Status: Complete   Done: [id number]  Discussion/Summary I discussed treatment options for symptomatic hydrocele with the patient. We discussed aspiration/sclerosis, and surgical excision. Given the  patient's young age and otherwise no comorbidities, I recommended the patient proceed with surgical excision. I went over the risks and benefits of the operation in detail. Patient would like to proceed with right hydrocelectomy. We will schedule this at his earliest convenience.

## 2013-07-18 NOTE — Transfer of Care (Signed)
Immediate Anesthesia Transfer of Care Note  Patient: Luis Knapp  Procedure(s) Performed: Procedure(s) (LRB): RIGHT HYDROCELECTOMY ADULT (Right)  Patient Location: PACU  Anesthesia Type: General  Level of Consciousness: awake, oriented, sedated and patient cooperative  Airway & Oxygen Therapy: Patient Spontanous Breathing and Patient connected to face mask oxygen  Post-op Assessment: Report given to PACU RN and Post -op Vital signs reviewed and stable  Post vital signs: Reviewed and stable  Complications: No apparent anesthesia complications

## 2013-07-18 NOTE — Anesthesia Procedure Notes (Signed)
Procedure Name: LMA Insertion Date/Time: 07/18/2013 7:37 AM Performed by: Denna Haggard D Pre-anesthesia Checklist: Patient identified, Emergency Drugs available, Suction available and Patient being monitored Patient Re-evaluated:Patient Re-evaluated prior to inductionOxygen Delivery Method: Circle System Utilized Preoxygenation: Pre-oxygenation with 100% oxygen Intubation Type: IV induction Ventilation: Mask ventilation without difficulty LMA: LMA inserted LMA Size: 4.0 Number of attempts: 1 Airway Equipment and Method: bite block Placement Confirmation: positive ETCO2 Tube secured with: Tape Dental Injury: Teeth and Oropharynx as per pre-operative assessment

## 2013-07-18 NOTE — Discharge Instructions (Addendum)
Discharge instructions following scrotal surgery  Call your doctor for:  Fever is greater than 100.5  Severe nausea or vomiting  Increasing pain not controlled by pain medication  Increasing redness or drainage from incisions  The number for questions or concerns is [phone]  Activity level: No lifting greater than 10 pounds (about equal to milk) for the next 2 weeks or until cleared to do so at follow-up appointment.  Otherwise activity as tolerated by comfort level.  Diet: May resume your regular diet as tolerated  Driving: No driving while still taking opiate pain medications (wait at least 6-8 hours after last dose).  No driving if you still sore from surgery as it may limit her ability to react quickly if necessary.   Shower/bath: May shower and get incision wet pad dry immediately following.  Do not scrub vigorously for the next 2-3 weeks.  Do not soak incision (ie  soaking in bath or swimming) until told he may do so by Dr., as this may promote a wound infection.  Wound care: He may cover wounds with sterile gauze as needed to prevent incisions rubbing on cloths.  Wear tight fitting underpants for at least 2 weeks.  He should apply cold compresses (ice or sac of frozen peas/corn) to your scrotum for at least 48 hours to reduce the swelling.  You should expect that his scrotum will swell up initially and then get smaller over the next 2-4 weeks.  Follow-up appointments: Follow-up appointment will be scheduled with Dr. [name] in 6 weeks for a wound check.  Post Anesthesia Home Care Instructions  Activity: Get plenty of rest for the remainder of the day. A responsible adult should stay with you for 24 hours following the procedure.  For the next 24 hours, DO NOT: -Drive a car -Paediatric nurse -Drink alcoholic beverages -Take any medication unless instructed by your physician -Make any legal decisions or sign important papers.  Meals: Start with liquid foods  such as gelatin or soup. Progress to regular foods as tolerated. Avoid greasy, spicy, heavy foods. If nausea and/or vomiting occur, drink only clear liquids until the nausea and/or vomiting subsides. Call your physician if vomiting continues.  Special Instructions/Symptoms: Your throat may feel dry or sore from the anesthesia or the breathing tube placed in your throat during surgery. If this causes discomfort, gargle with warm salt water. The discomfort should disappear within 24 hours.

## 2013-07-21 ENCOUNTER — Encounter (HOSPITAL_BASED_OUTPATIENT_CLINIC_OR_DEPARTMENT_OTHER): Payer: Self-pay | Admitting: Urology

## 2014-02-17 ENCOUNTER — Encounter: Payer: Self-pay | Admitting: Gastroenterology

## 2014-06-15 ENCOUNTER — Other Ambulatory Visit: Payer: Self-pay | Admitting: *Deleted

## 2014-06-15 MED ORDER — HYDROCHLOROTHIAZIDE 25 MG PO TABS: 12.5000 mg | ORAL_TABLET | Freq: Every morning | ORAL | Status: DC

## 2014-06-15 NOTE — Telephone Encounter (Signed)
Left msg on triage stating need rx sent [facility] on his [facility]. He has cpx appt on 06/29/14, but he is out of medicine. Called pt wife no answer LMOM 30 day [facility] to [facility].../lmb

## 2014-06-24 ENCOUNTER — Ambulatory Visit (INDEPENDENT_AMBULATORY_CARE_PROVIDER_SITE_OTHER): Payer: 59 | Admitting: Family

## 2014-06-24 ENCOUNTER — Encounter: Payer: Self-pay | Admitting: Family

## 2014-06-24 ENCOUNTER — Other Ambulatory Visit (INDEPENDENT_AMBULATORY_CARE_PROVIDER_SITE_OTHER): Payer: 59

## 2014-06-24 VITALS — BP 160/92 | HR 81 | Temp 97.8°F | Resp 18 | Ht 66.0 in | Wt 243.4 lb

## 2014-06-24 DIAGNOSIS — I10 Essential (primary) hypertension: Secondary | ICD-10-CM

## 2014-06-24 DIAGNOSIS — Z0000 Encounter for general adult medical examination without abnormal findings: Secondary | ICD-10-CM

## 2014-06-24 LAB — BASIC METABOLIC PANEL
BUN: 21 mg/dL (ref 6–23)
CHLORIDE: 102 meq/L (ref 96–112)
CO2: 28 mEq/L (ref 19–32)
Calcium: 9.2 mg/dL (ref 8.4–10.5)
Creatinine, Ser: 1.14 mg/dL (ref 0.40–1.50)
GFR: 68.7 mL/min (ref >60.00)
Glucose, Bld: 112 mg/dL — ABNORMAL HIGH (ref 70–99)
POTASSIUM: 4.2 meq/L (ref 3.5–5.1)
Sodium: 138 mEq/L (ref 135–145)

## 2014-06-24 LAB — TSH: TSH: 3.7 u[IU]/mL (ref 0.35–4.50)

## 2014-06-24 LAB — CBC
HCT: 45 % (ref 39.0–52.0)
HEMOGLOBIN: 15.6 g/dL (ref 13.0–17.0)
MCHC: 34.6 g/dL (ref 30.0–36.0)
MCV: 85.7 fl (ref 78.0–100.0)
Platelets: 274 10*3/uL (ref 150.0–400.0)
RBC: 5.26 Mil/uL (ref 4.22–5.81)
RDW: 13.5 % (ref 11.5–15.5)
WBC: 8.3 10*3/uL (ref 4.0–10.5)

## 2014-06-24 LAB — LIPID PANEL
CHOL/HDL RATIO: 4
Cholesterol: 154 mg/dL (ref 0–200)
HDL: 43.6 mg/dL (ref >39.00)
LDL Cholesterol: 72 mg/dL (ref 0–99)
NonHDL: 110.4
Triglycerides: 194 mg/dL — ABNORMAL HIGH (ref 0.0–149.0)
VLDL: 38.8 mg/dL (ref 0.0–40.0)

## 2014-06-24 MED ORDER — HYDROCHLOROTHIAZIDE 25 MG PO TABS: 25.0000 mg | ORAL_TABLET | Freq: Every day | ORAL | Status: DC

## 2014-06-24 NOTE — Progress Notes (Signed)
Pre visit review using our clinic review tool, if applicable. No additional management support is needed unless otherwise documented below in the visit note. 

## 2014-06-24 NOTE — Progress Notes (Signed)
Subjective:    Patient ID: Luis Knapp, male    DOB: 11-Dec-1950, 64 y.o.   MRN: [id number]  Chief Complaint  Patient presents with  . Establish Care    CPE-fasting    HPI:  Luis Knapp is a 64 y.o. male who presents today for an annual wellness visit.   1) Health Maintenance - Overall feels ok.   Diet - Averages 2-3 meals with snacks; fruits, vegetables, lean meats; drinks about 3-4 cups of caffeine per day; Limits processed and fried foods.  Exercise - No structured exercise program currently.  2) Preventative Exams / Immunizations:  Dental -- Up to date  Vision -- Due for exam    Health Maintenance  Topic Date Due  . ZOSTAVAX  04/27/2010  . INFLUENZA VACCINE  12/13/2013  . COLONOSCOPY  10/29/2016  . TETANUS/TDAP  09/19/2017  Declines flu shot and shingles vaccination  Immunization History  Administered Date(s) Administered  . Pneumococcal Conjugate-13 06/23/2013  . Pneumococcal Polysaccharide-23 03/20/2012  . Td 09/20/2007    Review of Systems  Constitutional: Denies fever, chills, fatigue, or significant weight gain/loss. HENT: Head: Denies headache or neck pain Ears: Denies changes in hearing, ringing in ears, earache, drainage Nose: Denies discharge, stuffiness, itching, nosebleed, sinus pain Throat: Denies sore throat, hoarseness, dry mouth, sores, thrush Eyes: Denies loss/changes in vision, pain, redness, blurry/double vision, flashing lights Cardiovascular: Denies chest pain/discomfort, tightness, palpitations, shortness of breath with activity, difficulty lying down, swelling, sudden awakening with shortness of breath Respiratory: Denies shortness of breath, cough, sputum production, wheezing Gastrointestinal: Denies dysphasia, heartburn, change in appetite, nausea, change in bowel habits, rectal bleeding, constipation, diarrhea, yellow skin or eyes Genitourinary: Denies frequency, urgency, burning/pain, blood in urine, incontinence, change in  urinary strength. Musculoskeletal: Denies muscle/joint pain, stiffness, back pain, redness or swelling of joints, trauma Skin: Denies rashes, lumps, itching, dryness, color changes, or hair/nail changes Neurological: Denies dizziness, fainting, seizures, weakness, numbness, tingling, tremor Psychiatric - Denies nervousness, stress, depression or memory loss Endocrine: Denies heat or cold intolerance, sweating, frequent urination, excessive thirst, changes in appetite Hematologic: Denies ease of bruising or bleeding     Objective:    BP 160/92 mmHg  Pulse 81  Temp(Src) 97.8 F (36.6 C) (Oral)  Resp 18  Ht 5\' 6"  (1.676 m)  Wt 243 lb 6.4 oz (110.406 kg)  BMI 39.30 kg/m2  SpO2 97% Nursing note and vital signs reviewed.  Physical Exam  Constitutional: He is oriented to person, place, and time. He appears well-developed and well-nourished.  HENT:  Head: Normocephalic.  Right Ear: Hearing, tympanic membrane, external ear and ear canal normal.  Left Ear: Hearing, tympanic membrane, external ear and ear canal normal.  Nose: Nose normal.  Mouth/Throat: Uvula is midline, oropharynx is clear and moist and mucous membranes are normal.  Eyes: Conjunctivae and EOM are normal. Pupils are equal, round, and reactive to light.  Neck: Neck supple. No JVD present. No tracheal deviation present. No thyromegaly present.  Cardiovascular: Normal rate, regular rhythm, normal heart sounds and intact distal pulses.   Pulmonary/Chest: Effort normal and breath sounds normal.  Abdominal: Soft. Bowel sounds are normal. He exhibits no distension and no mass. There is no tenderness. There is no rebound and no guarding.  Musculoskeletal: Normal range of motion. He exhibits no edema or tenderness.  Lymphadenopathy:    He has no cervical adenopathy.  Neurological: He is alert and oriented to person, place, and time. He has normal reflexes. No cranial nerve deficit. He exhibits  normal muscle tone. Coordination  normal.  Skin: Skin is warm and dry.  Psychiatric: He has a normal mood and affect. His behavior is normal. Judgment and thought content normal.       Assessment & Plan:

## 2014-06-24 NOTE — Patient Instructions (Signed)
Thank you for choosing [facility].  Summary/Instructions:  Your prescription(s) have been submitted to your pharmacy or been printed and provided for you. Please take as directed and contact our office if you believe you are having problem(s) with the medication(s) or have any questions.  Please stop by the lab on the basement level of the building for your blood work. Your results will be released to [facility] (or called to you) after review, usually within 72 hours after test completion. If any changes need to be made, you will be notified at that same time.  If your symptoms worsen or fail to improve, please contact our office for further instruction, or in case of emergency go directly to the emergency room at the closest medical facility.    Health Maintenance A healthy lifestyle and preventative care can promote health and wellness.  Maintain regular health, dental, and eye exams.  Eat a healthy diet. Foods like vegetables, fruits, whole grains, low-fat dairy products, and lean protein foods contain the nutrients you need and are low in calories. Decrease your intake of foods high in solid fats, added sugars, and salt. Get information about a proper diet from your health care provider, if necessary.  Regular physical exercise is one of the most important things you can do for your health. Most adults should get at least 150 minutes of moderate-intensity exercise (any activity that increases your heart rate and causes you to sweat) each week. In addition, most adults need muscle-strengthening exercises on 2 or more days a week.   Maintain a healthy weight. The body mass index (BMI) is a screening tool to identify possible weight problems. It provides an estimate of body fat based on height and weight. Your health care provider can find your BMI and can help you achieve or maintain a healthy weight. For males 20 years and older:  A BMI below 18.5 is considered underweight.  A BMI  of 18.5 to 24.9 is normal.  A BMI of 25 to 29.9 is considered overweight.  A BMI of 30 and above is considered obese.  Maintain normal blood lipids and cholesterol by exercising and minimizing your intake of saturated fat. Eat a balanced diet with plenty of fruits and vegetables. Blood tests for lipids and cholesterol should begin at age 17 and be repeated every 5 years. If your lipid or cholesterol levels are high, you are over age 54, or you are at high risk for heart disease, you may need your cholesterol levels checked more frequently.Ongoing high lipid and cholesterol levels should be treated with medicines if diet and exercise are not working.  If you smoke, find out from your health care provider how to quit. If you do not use tobacco, do not start.  Lung cancer screening is recommended for adults aged 86-80 years who are at high risk for developing lung cancer because of a history of smoking. A yearly low-dose CT scan of the lungs is recommended for people who have at least a 30-pack-year history of smoking and are current smokers or have quit within the past 15 years. A pack year of smoking is smoking an average of 1 pack of cigarettes a day for 1 year (for example, a 30-pack-year history of smoking could mean smoking 1 pack a day for 30 years or 2 packs a day for 15 years). Yearly screening should continue until the smoker has stopped smoking for at least 15 years. Yearly screening should be stopped for people who develop a  health problem that would prevent them from having lung cancer treatment.  If you choose to drink alcohol, do not have more than 2 drinks per day. One drink is considered to be 12 oz (360 mL) of beer, 5 oz (150 mL) of wine, or 1.5 oz (45 mL) of liquor.  Avoid the use of street drugs. Do not share needles with anyone. Ask for help if you need support or instructions about stopping the use of drugs.  High blood pressure causes heart disease and increases the risk of  stroke. Blood pressure should be checked at least every 1-2 years. Ongoing high blood pressure should be treated with medicines if weight loss and exercise are not effective.  If you are 45-41 years old, ask your health care provider if you should take aspirin to prevent heart disease.  Diabetes screening involves taking a blood sample to check your fasting blood sugar level. This should be done once every 3 years after age 31 if you are at a normal weight and without risk factors for diabetes. Testing should be considered at a younger age or be carried out more frequently if you are overweight and have at least 1 risk factor for diabetes.  Colorectal cancer can be detected and often prevented. Most routine colorectal cancer screening begins at the age of 74 and continues through age 30. However, your health care provider may recommend screening at an earlier age if you have risk factors for colon cancer. On a yearly basis, your health care provider may provide home test kits to check for hidden blood in the stool. A small camera at the end of a tube may be used to directly examine the colon (sigmoidoscopy or colonoscopy) to detect the earliest forms of colorectal cancer. Talk to your health care provider about this at age 70 when routine screening begins. A direct exam of the colon should be repeated every 5-10 years through age 17, unless early forms of precancerous polyps or small growths are found.  People who are at an increased risk for hepatitis B should be screened for this virus. You are considered at high risk for hepatitis B if:  You were born in a country where hepatitis B occurs often. Talk with your health care provider about which countries are considered high risk.  Your parents were born in a high-risk country and you have not received a shot to protect against hepatitis B (hepatitis B vaccine).  You have HIV or AIDS.  You use needles to inject street drugs.  You live with, or have  sex with, someone who has hepatitis B.  You are a man who has sex with other men (MSM).  You get hemodialysis treatment.  You take certain medicines for conditions like cancer, organ transplantation, and autoimmune conditions.  Hepatitis C blood testing is recommended for all people born from [birth year] through 1966 and any individual with known risk factors for hepatitis C.  Healthy men should no longer receive prostate-specific antigen (PSA) blood tests as part of routine cancer screening. Talk to your health care provider about prostate cancer screening.  Testicular cancer screening is not recommended for adolescents or adult males who have no symptoms. Screening includes self-exam, a health care provider exam, and other screening tests. Consult with your health care provider about any symptoms you have or any concerns you have about testicular cancer.  Practice safe sex. Use condoms and avoid high-risk sexual practices to reduce the spread of sexually transmitted infections (STIs).  You  should be screened for STIs, including gonorrhea and chlamydia if:  You are sexually active and are younger than 24 years.  You are older than 24 years, and your health care provider tells you that you are at risk for this type of infection.  Your sexual activity has changed since you were last screened, and you are at an increased risk for chlamydia or gonorrhea. Ask your health care provider if you are at risk.  If you are at risk of being infected with HIV, it is recommended that you take a prescription medicine daily to prevent HIV infection. This is called pre-exposure prophylaxis (PrEP). You are considered at risk if:  You are a man who has sex with other men (MSM).  You are a heterosexual man who is sexually active with multiple partners.  You take drugs by injection.  You are sexually active with a partner who has HIV.  Talk with your health care provider about whether you are at high risk of  being infected with HIV. If you choose to begin PrEP, you should first be tested for HIV. You should then be tested every 3 months for as long as you are taking PrEP.  Use sunscreen. Apply sunscreen liberally and repeatedly throughout the day. You should seek shade when your shadow is shorter than you. Protect yourself by wearing long sleeves, pants, a wide-brimmed hat, and sunglasses year round whenever you are outdoors.  Tell your health care provider of new moles or changes in moles, especially if there is a change in shape or color. Also, tell your health care provider if a mole is larger than the size of a pencil eraser.  A one-time screening for abdominal aortic aneurysm (AAA) and surgical repair of large AAAs by ultrasound is recommended for men aged 39-75 years who are current or former smokers.  Stay current with your vaccines (immunizations). Document Released: 10/28/2007 Document Revised: 05/06/2013 Document Reviewed: 09/26/2010 [facility] Patient Information 2015 [place], [place]. This information is not intended to replace advice given to you by your health care provider. Make sure you discuss any questions you have with your health care provider.

## 2014-06-24 NOTE — Assessment & Plan Note (Signed)
1) Anticipatory Guidance: Discussed importance of wearing a seatbelt while driving and not texting while driving; changing batteries in smoke detector at least once annually; wearing suntan lotion when outside; eating a balanced and moderate diet; getting physical activity at least 30 minutes per day.  2) Immunizations / Screenings / Labs:  Patient declines flu shot and Zostavax today. All other immunizations are up-to-date per recommendations. Patient is due for an eye exam. All other screenings are up-to-date per recommendations. Obtain CBC, BMET, Lipid profile and TSH.    Overall well exam. Discussed risk factors of hypertension and obesity. Patient is currently sedentary. Goal will be to lose approximately 5-10% of his body weight through increased nutrient density and increasing physical activity. Blood pressure remained slightly elevated. Increase hydrochlorothiazide to 25 mg daily. Follow up prevention exam in 1 year; follow-up for blood pressure in one month.

## 2014-06-25 ENCOUNTER — Telehealth: Payer: Self-pay | Admitting: Family

## 2014-06-25 ENCOUNTER — Encounter: Payer: Self-pay | Admitting: Family

## 2014-10-09 NOTE — Telephone Encounter (Signed)
Close encounter 

## 2015-06-27 ENCOUNTER — Other Ambulatory Visit: Payer: Self-pay | Admitting: Family

## 2015-10-18 ENCOUNTER — Other Ambulatory Visit (INDEPENDENT_AMBULATORY_CARE_PROVIDER_SITE_OTHER): Payer: Medicare Other

## 2015-10-18 ENCOUNTER — Encounter: Payer: Self-pay | Admitting: Family

## 2015-10-18 ENCOUNTER — Ambulatory Visit (INDEPENDENT_AMBULATORY_CARE_PROVIDER_SITE_OTHER): Payer: Medicare Other | Admitting: Family

## 2015-10-18 VITALS — BP 140/82 | HR 73 | Temp 98.2°F | Resp 16 | Ht 66.0 in | Wt 240.8 lb

## 2015-10-18 DIAGNOSIS — R972 Elevated prostate specific antigen [PSA]: Secondary | ICD-10-CM

## 2015-10-18 DIAGNOSIS — Z125 Encounter for screening for malignant neoplasm of prostate: Secondary | ICD-10-CM

## 2015-10-18 DIAGNOSIS — I10 Essential (primary) hypertension: Secondary | ICD-10-CM

## 2015-10-18 DIAGNOSIS — Z0000 Encounter for general adult medical examination without abnormal findings: Secondary | ICD-10-CM

## 2015-10-18 DIAGNOSIS — Z7289 Other problems related to lifestyle: Secondary | ICD-10-CM

## 2015-10-18 LAB — LIPID PANEL
Cholesterol: 158 mg/dL (ref 0–200)
HDL: 45.9 mg/dL (ref >39.00)
LDL Cholesterol: 84 mg/dL (ref 0–99)
NONHDL: 112.47
TRIGLYCERIDES: 142 mg/dL (ref 0.0–149.0)
Total CHOL/HDL Ratio: 3
VLDL: 28.4 mg/dL (ref 0.0–40.0)

## 2015-10-18 LAB — COMPREHENSIVE METABOLIC PANEL
ALK PHOS: 73 U/L (ref 39–117)
ALT: 20 U/L (ref 0–53)
AST: 14 U/L (ref 0–37)
Albumin: 4.5 g/dL (ref 3.5–5.2)
BUN: 19 mg/dL (ref 6–23)
CO2: 30 mEq/L (ref 19–32)
Calcium: 9.5 mg/dL (ref 8.4–10.5)
Chloride: 102 mEq/L (ref 96–112)
Creatinine, Ser: 1.1 mg/dL (ref 0.40–1.50)
GFR: 71.3 mL/min (ref >60.00)
Glucose, Bld: 117 mg/dL — ABNORMAL HIGH (ref 70–99)
POTASSIUM: 4.5 meq/L (ref 3.5–5.1)
SODIUM: 140 meq/L (ref 135–145)
TOTAL PROTEIN: 6.9 g/dL (ref 6.0–8.3)
Total Bilirubin: 0.3 mg/dL (ref 0.2–1.2)

## 2015-10-18 LAB — CBC
HEMATOCRIT: 45.4 % (ref 39.0–52.0)
Hemoglobin: 15.5 g/dL (ref 13.0–17.0)
MCHC: 34.2 g/dL (ref 30.0–36.0)
MCV: 86.1 fl (ref 78.0–100.0)
Platelets: 280 10*3/uL (ref 150.0–400.0)
RBC: 5.28 Mil/uL (ref 4.22–5.81)
RDW: 13.4 % (ref 11.5–15.5)
WBC: 7.3 10*3/uL (ref 4.0–10.5)

## 2015-10-18 LAB — PSA, MEDICARE: PSA: 5.16 ng/ml — ABNORMAL HIGH (ref 0.10–4.00)

## 2015-10-18 LAB — HEPATITIS C ANTIBODY: HCV Ab: NEGATIVE

## 2015-10-18 NOTE — Assessment & Plan Note (Signed)
1) Anticipatory Guidance: Discussed importance of wearing a seatbelt while driving and not texting while driving; changing batteries in smoke detector at least once annually; wearing suntan lotion when outside; eating a balanced and moderate diet; getting physical activity at least 30 minutes per day.  2) Immunizations / Screenings / Labs:  Declines Zostavax. All other immunizations are up-to-date with plan for Pneumovax up-to-date in 2 years. Obtain hepatitis C antibody for hepatitis C screening. All other screenings are up-to-date per recommendations. Obtain CBC, CMET, and  Lipid profile.  Overall well exam with risk factors for cardiovascular disease including hypertension and obesity. Recommend weight loss of approximately 5-10% of current body weight through nutrition and physical activity.Recommend increasing physical activity to 30 minutes of moderate level activity daily. Encourage nutritional intake that focuses on nutrient dense foods and is moderate, varied, and balanced and is low in saturated fats and processed/sugary foods. Hypertension appears adequately controlled with current medication regimen. Continue other healthy lifestyle behaviors and choices. Follow-up prevention exam in 1 year. Follow-up office visit pending blood work as needed for chronic conditions.

## 2015-10-18 NOTE — Progress Notes (Addendum)
Subjective:    Patient ID: Luis Knapp, male    DOB: 1950-09-18, 65 y.o.   MRN: [id number]  Chief Complaint  Patient presents with  . CPE    fasting    HPI:  Luis Knapp is a 65 y.o. male who presents today for an annual wellness visit.   1) Health Maintenance -   Diet - Averages about 2-3 meals per day consisting of chicken, fruits, vegetables, occasional beef; Caffeine intake of about 3-4 cups per day.  Exercise - Limited exercise   2) Preventative Exams / Immunizations:  Dental -- Up to date  Vision -- Up to date   Health Maintenance  Topic Date Due  . Hepatitis C Screening  03/05/1951  . HIV Screening  04/27/1965  . ZOSTAVAX  10/16/2016 (Originally 04/27/2010)  . INFLUENZA VACCINE  12/14/2015  . COLONOSCOPY  10/29/2016  . PNA vac Low Risk Adult (2 of 2 - PPSV23) 03/20/2017  . TETANUS/TDAP  09/19/2017  Declines shingles   Immunization History  Administered Date(s) Administered  . Pneumococcal Conjugate-13 06/23/2013  . Pneumococcal Polysaccharide-23 03/20/2012  . Td 09/20/2007    RISK FACTORS  Tobacco History  Smoking status  . Former Smoker -- 3.00 packs/day for 25 years  . Types: Cigarettes  . Quit date: 10/16/1996  Smokeless tobacco  . Never Used     Cardiac risk factors: advanced age (older than 69 for men, 19 for women), hypertension, male gender, obesity (BMI >= 30 kg/m2) and sedentary lifestyle.  Depression Screen  Depression screen [facility] 2/9 10/18/2015  Decreased Interest 0  Down, Depressed, Hopeless 0  PHQ - 2 Score 0    Activities of Daily Living In your present state of health, do you have any difficulty performing the following activities?:  Driving? No Managing money?  No Feeding yourself? No Getting from bed to chair? No Climbing a flight of stairs? No Preparing food and eating?: No Bathing or showering? No Getting dressed: No Getting to the toilet? No Using the toilet: No Moving around from place to place:  No In the past year have you fallen or had a near fall?:No   Home Safety Has smoke detector and wears seat belts.  No excess sun exposure. Are there smokers in your home (other than you)?  No Do you feel safe at home?  Yes  Hearing Difficulties: No Do you often ask people to speak up or repeat themselves? No Do you experience ringing or noises in your ears? No  Do you have difficulty understanding soft or whispered voices? No    Cognitive Testing  Alert? Yes   Normal Appearance? Yes  Oriented to person? Yes  Place? Yes   Time? Yes  Recall of three objects?  Yes  Can perform simple calculations? Yes  Displays appropriate judgment? Yes  Can read the correct time from a watch face? Yes  Do you feel that you have a problem with memory? No  Do you often misplace items? No   Advanced Directives have been discussed with the patient? Yes  Current Physicians/Providers and Suppliers  1. Terri Piedra, FNP - Internal Medicine  Indicate any recent Medical Services you may have received from other than [facility] providers in the past year (date may be approximate).  All answers were reviewed with the patient and necessary referrals were made:  Mauricio Po, [place]   10/18/2015    No Known Allergies   Outpatient Prescriptions Prior to Visit  Medication Sig Dispense Refill  . calcium carbonate (  TUMS - DOSED IN MG ELEMENTAL CALCIUM) 500 MG chewable tablet Chew 1 tablet by mouth as needed for indigestion or heartburn.    . hydrochlorothiazide (HYDRODIURIL) 25 MG tablet TAKE 1 TABLET (25 MG TOTAL) BY MOUTH DAILY. 90 tablet 1  . Multiple Vitamins-Minerals (MULTIVITAMIN PO) Take by mouth daily.     No facility-administered medications prior to visit.     Past Medical History  Diagnosis Date  . Diverticulosis of colon (without mention of hemorrhage)   . Allergic rhinitis due to pollen   . Unspecified essential hypertension   . 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 [name]   . GERD (gastroesophageal reflux disease)   . Wears glasses      Past Surgical History  Procedure Laterality Date  . Colonoscopy with propofol  10-30-2011  &  2008    POLYPECTOMY  . Hydrocele excision Right 07/18/2013    Procedure: RIGHT HYDROCELECTOMY ADULT;  Surgeon: Ardis Hughs, MD;  Location: [facility];  Service: Urology;  Laterality: Right;     Family History  Problem Relation Age of Onset  . Coronary artery disease Father   . Heart attack Father     2nd one fatal  . Pulmonary embolism Father     ?  Marland Kitchen Dementia Father   . Heart disease Father   . Dementia Mother   . Hypertension Mother   . Macular degeneration Mother   . Kidney disease Mother   . Diabetes Sister   . Prostate cancer Neg Hx   . Colon cancer Neg Hx   . Rectal cancer Neg Hx      Social History   Social History  . Marital Status: Married    Spouse Name: N/A  . Number of Children: N/A  . Years of Education: N/A   Occupational History  . 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 [employer] firm     [place] History Main Topics  . Smoking status: Former Smoker -- 3.00 packs/day for 25 years    Types: Cigarettes    Quit date: 10/16/1996  . Smokeless tobacco: Never Used  . Alcohol Use: No  . Drug Use: No  . Sexual Activity: Not on file   Other Topics Concern  . Not on file   Social History Narrative   Married '77- 19 years divorced; married '02. 2 sons - '79, '80; 1 grandson. Things are ok. Work is OK - Dance movement psychotherapist.      Review of Systems  Constitutional: Denies fever, chills, fatigue, or significant weight gain/loss. HENT: Head: Denies headache or neck pain Ears: Denies changes in hearing, ringing in ears, earache, drainage Nose: Denies discharge, stuffiness, itching, nosebleed, sinus pain Throat: Denies sore throat, hoarseness, dry mouth, sores, thrush Eyes: Denies loss/changes in vision, pain, redness,  blurry/double vision, flashing lights Cardiovascular: Denies chest pain/discomfort, tightness, palpitations, shortness of breath with activity, difficulty lying down, swelling, sudden awakening with shortness of breath Respiratory: Denies shortness of breath, cough, sputum production, wheezing Gastrointestinal: Denies dysphasia, heartburn, change in appetite, nausea, change in bowel habits, rectal bleeding, constipation, diarrhea, yellow skin or eyes Genitourinary: Denies frequency, urgency, burning/pain, blood in urine, incontinence, change in urinary strength. Musculoskeletal: Denies muscle/joint pain, stiffness, back pain, redness or swelling of joints, trauma Skin: Denies rashes, lumps, itching, dryness, color changes, or hair/nail changes Neurological: Denies dizziness, fainting, seizures, weakness, numbness, tingling, tremor  Psychiatric - Denies nervousness, stress, depression or memory loss Endocrine: Denies heat or cold intolerance, sweating, frequent urination, excessive thirst, changes in appetite Hematologic: Denies ease of bruising or bleeding    Objective:     BP 140/82 mmHg  Pulse 73  Temp(Src) 98.2 F (36.8 C) (Oral)  Resp 16  Ht 5\' 6"  (1.676 m)  Wt 240 lb 12.8 oz (109.226 kg)  BMI 38.88 kg/m2  SpO2 97% Nursing note and vital signs reviewed. Vision - With Correction R 20/25 L 20/20  Both 20/15  Physical Exam  Constitutional: He is oriented to person, place, and time. He appears well-developed and well-nourished.  HENT:  Head: Normocephalic.  Right Ear: Hearing, tympanic membrane, external ear and ear canal normal.  Left Ear: Hearing, tympanic membrane, external ear and ear canal normal.  Nose: Nose normal.  Mouth/Throat: Uvula is midline, oropharynx is clear and moist and mucous membranes are normal.  Eyes: Conjunctivae and EOM are normal. Pupils are equal, round, and reactive to light.  Neck: Neck supple. No JVD present. No tracheal deviation present. No  thyromegaly present.  Cardiovascular: Normal rate, regular rhythm, normal heart sounds and intact distal pulses.   Pulmonary/Chest: Effort normal and breath sounds normal.  Abdominal: Soft. Bowel sounds are normal. He exhibits no distension and no mass. There is no tenderness. There is no rebound and no guarding.  Musculoskeletal: Normal range of motion. He exhibits no edema or tenderness.  Lymphadenopathy:    He has no cervical adenopathy.  Neurological: He is alert and oriented to person, place, and time. He has normal reflexes. No cranial nerve deficit. He exhibits normal muscle tone. Coordination normal.  Skin: Skin is warm and dry.  Psychiatric: He has a normal mood and affect. His behavior is normal. Judgment and thought content normal.       Assessment & Plan:   During the course of the visit the patient was educated and counseled about appropriate screening and preventive services including:    Pneumococcal vaccine   Prostate cancer screening  Colorectal cancer screening  Nutrition counseling   Diet review for nutrition referral? Yes ____  Not Indicated _X___   Patient Instructions (the written plan) was given to the patient.  Medicare Attestation I have personally reviewed: The patient's medical and social history Their use of alcohol, tobacco or illicit drugs Their current medications and supplements The patient's functional ability including ADLs,fall risks, home safety risks, cognitive, and hearing and visual impairment Diet and physical activities Evidence for depression or mood disorders  The patient's weight, height, BMI,  have been recorded in the chart.  I have made referrals, counseling, and provided education to the patient based on review of the above and I have provided the patient with a written personalized care plan for preventive services.     Mauricio Po, FNP   10/18/2015    Problem List Items Addressed This Visit      Cardiovascular and  Mediastinum   Essential hypertension   Relevant Orders   CBC (Completed)   Comprehensive metabolic panel (Completed)   Lipid panel (Completed)     Other   Medicare annual wellness visit, initial - Primary    1) Anticipatory Guidance: Discussed importance of wearing a seatbelt while driving and not texting while driving; changing batteries in smoke detector at least once annually; wearing suntan lotion when outside; eating a balanced and moderate diet; getting physical activity at least 30 minutes per day.  2) Immunizations / Screenings / Labs:  Declines Zostavax. All other immunizations are up-to-date with plan for Pneumovax up-to-date in 2 years. Obtain hepatitis C antibody for hepatitis C screening. All other screenings are up-to-date per recommendations. Obtain CBC, CMET, and  Lipid profile.  Overall well exam with risk factors for cardiovascular disease including hypertension and obesity. Recommend weight loss of approximately 5-10% of current body weight through nutrition and physical activity.Recommend increasing physical activity to 30 minutes of moderate level activity daily. Encourage nutritional intake that focuses on nutrient dense foods and is moderate, varied, and balanced and is low in saturated fats and processed/sugary foods. Hypertension appears adequately controlled with current medication regimen. Continue other healthy lifestyle behaviors and choices. Follow-up prevention exam in 1 year. Follow-up office visit pending blood work as needed for chronic conditions.       Other Visit Diagnoses    Encounter for prostate cancer screening        Relevant Orders    PSA, Medicare    Other problems related to lifestyle        Relevant Orders    Hepatitis C antibody

## 2015-10-18 NOTE — Progress Notes (Signed)
Pre visit review using our clinic review tool, if applicable. No additional management support is needed unless otherwise documented below in the visit note. 

## 2015-10-18 NOTE — Patient Instructions (Signed)
Thank you for choosing [facility].  Summary/Instructions:  Your prescription(s) have been submitted to your pharmacy or been printed and provided for you. Please take as directed and contact our office if you believe you are having problem(s) with the medication(s) or have any questions.  Please stop by the lab on the basement level of the building for your blood work. Your results will be released to [place] (or called to you) after review, usually within 72 hours after test completion. If any changes need to be made, you will be notified at that same time.  Health Maintenance  Topic Date Due  . Hepatitis C Screening  11/16/1950  . HIV Screening  04/27/1966  . ZOSTAVAX  10/16/2016 (Originally 04/27/2010)  . INFLUENZA VACCINE  12/14/2015  . COLONOSCOPY  10/29/2016  . PNA vac Low Risk Adult (2 of 2 - PPSV23) 03/20/2017  . TETANUS/TDAP  09/19/2017   Health Maintenance, Male A healthy lifestyle and preventative care can promote health and wellness.  Maintain regular health, dental, and eye exams.  Eat a healthy diet. Foods like vegetables, fruits, whole grains, low-fat dairy products, and lean protein foods contain the nutrients you need and are low in calories. Decrease your intake of foods high in solid fats, added sugars, and salt. Get information about a proper diet from your health care provider, if necessary.  Regular physical exercise is one of the most important things you can do for your health. Most adults should get at least 150 minutes of moderate-intensity exercise (any activity that increases your heart rate and causes you to sweat) each week. In addition, most adults need muscle-strengthening exercises on 2 or more days a week.   Maintain a healthy weight. The body mass index (BMI) is a screening tool to identify possible weight problems. It provides an estimate of body fat based on height and weight. Your health care provider can find your BMI and can help you achieve  or maintain a healthy weight. For males 20 years and older:  A BMI below 18.5 is considered underweight.  A BMI of 18.5 to 24.9 is normal.  A BMI of 25 to 29.9 is considered overweight.  A BMI of 30 and above is considered obese.  Maintain normal blood lipids and cholesterol by exercising and minimizing your intake of saturated fat. Eat a balanced diet with plenty of fruits and vegetables. Blood tests for lipids and cholesterol should begin at age 36 and be repeated every 5 years. If your lipid or cholesterol levels are high, you are over age 23, or you are at high risk for heart disease, you may need your cholesterol levels checked more frequently.Ongoing high lipid and cholesterol levels should be treated with medicines if diet and exercise are not working.  If you smoke, find out from your health care provider how to quit. If you do not use tobacco, do not start.  Lung cancer screening is recommended for adults aged 23-80 years who are at high risk for developing lung cancer because of a history of smoking. A yearly low-dose CT scan of the lungs is recommended for people who have at least a 30-pack-year history of smoking and are current smokers or have quit within the past 15 years. A pack year of smoking is smoking an average of 1 pack of cigarettes a day for 1 year (for example, a 30-pack-year history of smoking could mean smoking 1 pack a day for 30 years or 2 packs a day for 15 years). Yearly  screening should continue until the smoker has stopped smoking for at least 15 years. Yearly screening should be stopped for people who develop a health problem that would prevent them from having lung cancer treatment.  If you choose to drink alcohol, do not have more than 2 drinks per day. One drink is considered to be 12 oz (360 mL) of beer, 5 oz (150 mL) of wine, or 1.5 oz (45 mL) of liquor.  Avoid the use of street drugs. Do not share needles with anyone. Ask for help if you need support or  instructions about stopping the use of drugs.  High blood pressure causes heart disease and increases the risk of stroke. High blood pressure is more likely to develop in:  People who have blood pressure in the end of the normal range (100-139/85-89 mm Hg).  People who are overweight or obese.  People who are African American.  If you are 62-56 years of age, have your blood pressure checked every 3-5 years. If you are 38 years of age or older, have your blood pressure checked every year. You should have your blood pressure measured twice--once when you are at a hospital or clinic, and once when you are not at a hospital or clinic. Record the average of the two measurements. To check your blood pressure when you are not at a hospital or clinic, you can use:  An automated blood pressure machine at a pharmacy.  A home blood pressure monitor.  If you are 43-34 years old, ask your health care provider if you should take aspirin to prevent heart disease.  Diabetes screening involves taking a blood sample to check your fasting blood sugar level. This should be done once every 3 years after age 87 if you are at a normal weight and without risk factors for diabetes. Testing should be considered at a younger age or be carried out more frequently if you are overweight and have at least 1 risk factor for diabetes.  Colorectal cancer can be detected and often prevented. Most routine colorectal cancer screening begins at the age of 5 and continues through age 31. However, your health care provider may recommend screening at an earlier age if you have risk factors for colon cancer. On a yearly basis, your health care provider may provide home test kits to check for hidden blood in the stool. A small camera at the end of a tube may be used to directly examine the colon (sigmoidoscopy or colonoscopy) to detect the earliest forms of colorectal cancer. Talk to your health care provider about this at age 55 when  routine screening begins. A direct exam of the colon should be repeated every 5-10 years through age 25, unless early forms of precancerous polyps or small growths are found.  People who are at an increased risk for hepatitis B should be screened for this virus. You are considered at high risk for hepatitis B if:  You were born in a country where hepatitis B occurs often. Talk with your health care provider about which countries are considered high risk.  Your parents were born in a high-risk country and you have not received a shot to protect against hepatitis B (hepatitis B vaccine).  You have HIV or AIDS.  You use needles to inject street drugs.  You live with, or have sex with, someone who has hepatitis B.  You are a man who has sex with other men (MSM).  You get hemodialysis treatment.  You take  certain medicines for conditions like cancer, organ transplantation, and autoimmune conditions.  Hepatitis C blood testing is recommended for all people born from [birth year] through 1966 and any individual with known risk factors for hepatitis C.  Healthy men should no longer receive prostate-specific antigen (PSA) blood tests as part of routine cancer screening. Talk to your health care provider about prostate cancer screening.  Testicular cancer screening is not recommended for adolescents or adult males who have no symptoms. Screening includes self-exam, a health care provider exam, and other screening tests. Consult with your health care provider about any symptoms you have or any concerns you have about testicular cancer.  Practice safe sex. Use condoms and avoid high-risk sexual practices to reduce the spread of sexually transmitted infections (STIs).  You should be screened for STIs, including gonorrhea and chlamydia if:  You are sexually active and are younger than 24 years.  You are older than 24 years, and your health care provider tells you that you are at risk for this type of  infection.  Your sexual activity has changed since you were last screened, and you are at an increased risk for chlamydia or gonorrhea. Ask your health care provider if you are at risk.  If you are at risk of being infected with HIV, it is recommended that you take a prescription medicine daily to prevent HIV infection. This is called pre-exposure prophylaxis (PrEP). You are considered at risk if:  You are a man who has sex with other men (MSM).  You are a heterosexual man who is sexually active with multiple partners.  You take drugs by injection.  You are sexually active with a partner who has HIV.  Talk with your health care provider about whether you are at high risk of being infected with HIV. If you choose to begin PrEP, you should first be tested for HIV. You should then be tested every 3 months for as long as you are taking PrEP.  Use sunscreen. Apply sunscreen liberally and repeatedly throughout the day. You should seek shade when your shadow is shorter than you. Protect yourself by wearing long sleeves, pants, a wide-brimmed hat, and sunglasses year round whenever you are outdoors.  Tell your health care provider of new moles or changes in moles, especially if there is a change in shape or color. Also, tell your health care provider if a mole is larger than the size of a pencil eraser.  A one-time screening for abdominal aortic aneurysm (AAA) and surgical repair of large AAAs by ultrasound is recommended for men aged 22-75 years who are current or former smokers.  Stay current with your vaccines (immunizations).   This information is not intended to replace advice given to you by your health care provider. Make sure you discuss any questions you have with your health care provider.   Document Released: 10/28/2007 Document Revised: 05/22/2014 Document Reviewed: 09/26/2010 Elsevier Interactive Patient Education 2016 [place] Directive Advance directives are the  legal documents that allow you to make choices about your health care and medical treatment if you cannot speak for yourself. Advance directives are a way for you to communicate your wishes to family, friends, and health care providers. The specified people can then convey your decisions about end-of-life care to avoid confusion if you should become unable to communicate. Ideally, the process of discussing and writing advance directives should happen over time rather than making decisions all at once. Advance directives can be modified as your  situation changes, and you can change your mind at any time, even after you have signed the advance directives. Each state has its own laws regarding advance directives. You may want to check with your health care provider, attorney, or state representative about the law in your state. Below are some examples of advance directives. LIVING WILL A living will is a set of instructions documenting your wishes about medical care when you cannot care for yourself. It is used if you become:  Terminally ill.  Incapacitated.  Unable to communicate.  Unable to make decisions. Items to consider in your living will include:  The use or non-use of life-sustaining equipment, such as dialysis machines and breathing machines (ventilators).  A do not resuscitate (DNR) order, which is the instruction not to use cardiopulmonary resuscitation (CPR) if breathing or heartbeat stops.  Tube feeding.  Withholding of food and fluids.  Comfort (palliative) care when the goal becomes comfort rather than a cure.  Organ and tissue donation. A living will does not give instructions about distribution of your money and property if you should pass away. It is advisable to seek the expert advice of a lawyer in drawing up a will regarding your possessions. Decisions about taxes, beneficiaries, and asset distribution will be legally binding. This process can relieve your family and  friends of any burdens surrounding disputes or questions that may come up about the allocation of your assets. DO NOT RESUSCITATE (DNR) A do not resuscitate (DNR) order is a request to not have CPR in the event that your heart stops beating or you stop breathing. Unless given other instructions, a health care provider will try to help any patient whose heart has stopped or who has stopped breathing.  HEALTH CARE PROXY AND DURABLE POWER OF ATTORNEY FOR HEALTH CARE A health care proxy is a person (agent) appointed to make medical decisions for you if you cannot. Generally, people choose someone they know well and trust to represent their preferences when they can no longer do so. You should be sure to ask this person for agreement to act as your agent. An agent may have to exercise judgment in the event of a medical decision for which your wishes are not known. The durable power of attorney for health care is the legal document that names your health care proxy. Once written, it should be:  Signed.  Notarized.  Dated.  Copied.  Witnessed.  Incorporated into your medical record. You may also want to appoint someone to manage your financial affairs if you cannot. This is called a durable power of attorney for finances. It is a separate legal document from the durable power of attorney for health care. You may choose the same person or someone different from your health care proxy to act as your agent in financial matters.   This information is not intended to replace advice given to you by your health care provider. Make sure you discuss any questions you have with your health care provider.   Document Released: 08/08/2007 Document Revised: 05/06/2013 Document Reviewed: 09/18/2012 Elsevier Interactive Patient Education [employer].

## 2015-10-19 ENCOUNTER — Encounter: Payer: Self-pay | Admitting: Family

## 2015-12-13 DIAGNOSIS — R972 Elevated prostate specific antigen [PSA]: Secondary | ICD-10-CM | POA: Diagnosis not present

## 2015-12-22 ENCOUNTER — Other Ambulatory Visit: Payer: Self-pay | Admitting: Family

## 2016-03-14 DIAGNOSIS — R972 Elevated prostate specific antigen [PSA]: Secondary | ICD-10-CM | POA: Diagnosis not present

## 2016-03-20 DIAGNOSIS — R972 Elevated prostate specific antigen [PSA]: Secondary | ICD-10-CM | POA: Diagnosis not present

## 2016-05-15 DIAGNOSIS — R339 Retention of urine, unspecified: Secondary | ICD-10-CM

## 2016-05-15 DIAGNOSIS — K5900 Constipation, unspecified: Secondary | ICD-10-CM

## 2016-05-15 DIAGNOSIS — N393 Stress incontinence (female) (male): Secondary | ICD-10-CM

## 2016-05-15 DIAGNOSIS — N3943 Post-void dribbling: Secondary | ICD-10-CM

## 2016-05-15 DIAGNOSIS — M6281 Muscle weakness (generalized): Secondary | ICD-10-CM

## 2016-05-15 DIAGNOSIS — M545 Low back pain, unspecified: Secondary | ICD-10-CM

## 2016-05-15 DIAGNOSIS — M62838 Other muscle spasm: Secondary | ICD-10-CM

## 2016-05-15 HISTORY — DX: Retention of urine, unspecified: R33.9

## 2016-05-15 HISTORY — DX: Stress incontinence (female) (male): N39.3

## 2016-05-15 HISTORY — DX: Post-void dribbling: N39.43

## 2016-05-15 HISTORY — DX: Low back pain, unspecified: M54.50

## 2016-05-15 HISTORY — DX: Constipation, unspecified: K59.00

## 2016-05-15 HISTORY — DX: Muscle weakness (generalized): M62.81

## 2016-05-15 HISTORY — DX: Other muscle spasm: M62.838

## 2016-07-05 DIAGNOSIS — R972 Elevated prostate specific antigen [PSA]: Secondary | ICD-10-CM | POA: Diagnosis not present

## 2016-07-10 DIAGNOSIS — R972 Elevated prostate specific antigen [PSA]: Secondary | ICD-10-CM | POA: Diagnosis not present

## 2016-08-25 DIAGNOSIS — R972 Elevated prostate specific antigen [PSA]: Secondary | ICD-10-CM | POA: Diagnosis not present

## 2016-08-25 DIAGNOSIS — D075 Carcinoma in situ of prostate: Secondary | ICD-10-CM | POA: Diagnosis not present

## 2016-08-25 DIAGNOSIS — C61 Malignant neoplasm of prostate: Secondary | ICD-10-CM | POA: Diagnosis not present

## 2016-08-31 DIAGNOSIS — C61 Malignant neoplasm of prostate: Secondary | ICD-10-CM | POA: Diagnosis not present

## 2016-09-20 ENCOUNTER — Other Ambulatory Visit: Payer: Self-pay | Admitting: Family

## 2016-09-22 ENCOUNTER — Other Ambulatory Visit: Payer: Self-pay | Admitting: Urology

## 2016-10-06 ENCOUNTER — Encounter: Payer: Self-pay | Admitting: Gastroenterology

## 2016-10-18 DIAGNOSIS — M6281 Muscle weakness (generalized): Secondary | ICD-10-CM | POA: Diagnosis not present

## 2016-10-18 DIAGNOSIS — M6208 Separation of muscle (nontraumatic), other site: Secondary | ICD-10-CM | POA: Diagnosis not present

## 2016-10-18 DIAGNOSIS — C61 Malignant neoplasm of prostate: Secondary | ICD-10-CM | POA: Diagnosis not present

## 2016-10-18 DIAGNOSIS — N3943 Post-void dribbling: Secondary | ICD-10-CM | POA: Diagnosis not present

## 2016-10-25 NOTE — Patient Instructions (Signed)
Luis Knapp  10/25/2016   Your procedure is scheduled on: 11/01/2016   Report to [facility] Main  Entrance [place]  elevators to 3rd floor to  [place] at    [place] AM.     Call this number if you have problems the morning of surgery [phone]    Remember: ONLY 1 PERSON MAY GO WITH YOU TO SHORT STAY TO GET  READY MORNING OF YOUR SURGERY.  Do not eat food or drink liquids :After Midnight.             Fleets enema nite before surgery.      Take these medicines the morning of surgery with A SIP OF WATER: none                                 You may not have any metal on your body including hair pins and              piercings  Do not wear jewelry,  lotions, powders or perfumes, deodorant                         Men may shave face and neck.   Do not bring valuables to the hospital. [place].  Contacts, dentures or bridgework may not be worn into surgery.  Leave suitcase in the car. After surgery it may be brought to your room.                       Please read over the following fact sheets you were given: _____________________________________________________________________             [facility] - Preparing for Surgery Before surgery, you can play an important role.  Because skin is not sterile, your skin needs to be as free of germs as possible.  You can reduce the number of germs on your skin by washing with CHG (chlorahexidine gluconate) soap before surgery.  CHG is an antiseptic cleaner which kills germs and bonds with the skin to continue killing germs even after washing. Please DO NOT use if you have an allergy to CHG or antibacterial soaps.  If your skin becomes reddened/irritated stop using the CHG and inform your nurse when you arrive at Short Stay. Do not shave (including legs and underarms) for at least 48 hours prior to the first CHG shower.  You may shave your  face/neck. Please follow these instructions carefully:  1.  Shower with CHG Soap the night before surgery and the  morning of Surgery.  2.  If you choose to wash your hair, wash your hair first as usual with your  normal  shampoo.  3.  After you shampoo, rinse your hair and body thoroughly to remove the  shampoo.                           4.  Use CHG as you would any other liquid soap.  You can apply chg directly  to the skin and wash  Gently with a scrungie or clean washcloth.  5.  Apply the CHG Soap to your body ONLY FROM THE NECK DOWN.   Do not use on face/ open                           Wound or open sores. Avoid contact with eyes, ears mouth and genitals (private parts).                       Wash face,  Genitals (private parts) with your normal soap.             6.  Wash thoroughly, paying special attention to the area where your surgery  will be performed.  7.  Thoroughly rinse your body with warm water from the neck down.  8.  DO NOT shower/wash with your normal soap after using and rinsing off  the CHG Soap.                9.  Pat yourself dry with a clean towel.            10.  Wear clean pajamas.            11.  Place clean sheets on your bed the night of your first shower and do not  sleep with pets. Day of Surgery : Do not apply any lotions/deodorants the morning of surgery.  Please wear clean clothes to the hospital/surgery center.  FAILURE TO FOLLOW THESE INSTRUCTIONS MAY RESULT IN THE CANCELLATION OF YOUR SURGERY PATIENT SIGNATURE_________________________________  NURSE SIGNATURE__________________________________  ________________________________________________________________________  WHAT IS A BLOOD TRANSFUSION? Blood Transfusion Information  A transfusion is the replacement of blood or some of its parts. Blood is made up of multiple cells which provide different functions.  Red blood cells carry oxygen and are used for blood loss  replacement.  White blood cells fight against infection.  Platelets control bleeding.  Plasma helps clot blood.  Other blood products are available for specialized needs, such as hemophilia or other clotting disorders. BEFORE THE TRANSFUSION  Who gives blood for transfusions?   Healthy volunteers who are fully evaluated to make sure their blood is safe. This is blood bank blood. Transfusion therapy is the safest it has ever been in the practice of medicine. Before blood is taken from a donor, a complete history is taken to make sure that person has no history of diseases nor engages in risky social behavior (examples are intravenous drug use or sexual activity with multiple partners). The donor's travel history is screened to minimize risk of transmitting infections, such as malaria. The donated blood is tested for signs of infectious diseases, such as HIV and hepatitis. The blood is then tested to be sure it is compatible with you in order to minimize the chance of a transfusion reaction. If you or a relative donates blood, this is often done in anticipation of surgery and is not appropriate for emergency situations. It takes many days to process the donated blood. RISKS AND COMPLICATIONS Although transfusion therapy is very safe and saves many lives, the main dangers of transfusion include:   Getting an infectious disease.  Developing a transfusion reaction. This is an allergic reaction to something in the blood you were given. Every precaution is taken to prevent this. The decision to have a blood transfusion has been considered carefully by your caregiver before blood is given. Blood is not given unless the benefits outweigh  the risks. AFTER THE TRANSFUSION  Right after receiving a blood transfusion, you will usually feel much better and more energetic. This is especially true if your red blood cells have gotten low (anemic). The transfusion raises the level of the red blood cells which  carry oxygen, and this usually causes an energy increase.  The nurse administering the transfusion will monitor you carefully for complications. HOME CARE INSTRUCTIONS  No special instructions are needed after a transfusion. You may find your energy is better. Speak with your caregiver about any limitations on activity for underlying diseases you may have. SEEK MEDICAL CARE IF:   Your condition is not improving after your transfusion.  You develop redness or irritation at the intravenous (IV) site. SEEK IMMEDIATE MEDICAL CARE IF:  Any of the following symptoms occur over the next 12 hours:  Shaking chills.  You have a temperature by mouth above 102 F (38.9 C), not controlled by medicine.  Chest, back, or muscle pain.  People around you feel you are not acting correctly or are confused.  Shortness of breath or difficulty breathing.  Dizziness and fainting.  You get a rash or develop hives.  You have a decrease in urine output.  Your urine turns a dark color or changes to pink, red, or brown. Any of the following symptoms occur over the next 10 days:  You have a temperature by mouth above 102 F (38.9 C), not controlled by medicine.  Shortness of breath.  Weakness after normal activity.  The white part of the eye turns yellow (jaundice).  You have a decrease in the amount of urine or are urinating less often.  Your urine turns a dark color or changes to pink, red, or brown. Document Released: 04/28/2000 Document Revised: 07/24/2011 Document Reviewed: 12/16/2007 [facility] Patient Information 2014 [place], [place].  _______________________________________________________________________

## 2016-10-30 ENCOUNTER — Encounter (HOSPITAL_COMMUNITY)
Admission: RE | Admit: 2016-10-30 | Discharge: 2016-10-30 | Disposition: A | Discharge status: Home / Self Care | Payer: Medicare Other | Source: Ambulatory Visit | Attending: Urology | Admitting: Urology

## 2016-10-30 ENCOUNTER — Encounter (HOSPITAL_COMMUNITY): Payer: Self-pay

## 2016-10-30 DIAGNOSIS — Z87891 Personal history of nicotine dependence: Secondary | ICD-10-CM | POA: Diagnosis not present

## 2016-10-30 DIAGNOSIS — R9431 Abnormal electrocardiogram [ECG] [EKG]: Secondary | ICD-10-CM

## 2016-10-30 DIAGNOSIS — Z01818 Encounter for other preprocedural examination: Secondary | ICD-10-CM

## 2016-10-30 DIAGNOSIS — C61 Malignant neoplasm of prostate: Secondary | ICD-10-CM | POA: Diagnosis not present

## 2016-10-30 DIAGNOSIS — Z01810 Encounter for preprocedural cardiovascular examination: Secondary | ICD-10-CM

## 2016-10-30 DIAGNOSIS — I10 Essential (primary) hypertension: Secondary | ICD-10-CM | POA: Diagnosis not present

## 2016-10-30 DIAGNOSIS — Z79899 Other long term (current) drug therapy: Secondary | ICD-10-CM

## 2016-10-30 DIAGNOSIS — Z6839 Body mass index (BMI) 39.0-39.9, adult: Secondary | ICD-10-CM | POA: Diagnosis not present

## 2016-10-30 LAB — BASIC METABOLIC PANEL
Anion gap: 9 (ref 5–15)
BUN: 21 mg/dL — AB (ref 6–20)
CHLORIDE: 105 mmol/L (ref 101–111)
CO2: 29 mmol/L (ref 22–32)
Calcium: 9.2 mg/dL (ref 8.9–10.3)
Creatinine, Ser: 1.06 mg/dL (ref 0.61–1.24)
GFR calc Af Amer: >60 mL/min (ref >60)
GFR calc non Af Amer: >60 mL/min (ref >60)
GLUCOSE: 121 mg/dL — AB (ref 65–99)
Potassium: 4.1 mmol/L (ref 3.5–5.1)
Sodium: 143 mmol/L (ref 135–145)

## 2016-10-30 LAB — CBC
HEMATOCRIT: 44.9 % (ref 39.0–52.0)
Hemoglobin: 15.3 g/dL (ref 13.0–17.0)
MCH: 30.1 pg (ref 26.0–34.0)
MCHC: 34.1 g/dL (ref 30.0–36.0)
MCV: 88.4 fL (ref 78.0–100.0)
Platelets: 254 10*3/uL (ref 150–400)
RBC: 5.08 MIL/uL (ref 4.22–5.81)
RDW: 13.2 % (ref 11.5–15.5)
WBC: 6.7 10*3/uL (ref 4.0–10.5)

## 2016-10-30 LAB — ABO/RH: ABO/RH(D): B POS

## 2016-10-30 NOTE — Progress Notes (Signed)
.......  March 31, 1951  patient's date of birth is  Your patient has screened at an elevated risk for obstructive sleep apnea using the STOP-Bang tool during a presurgical visit. A score of 5 or greater is an elevated risk.

## 2016-10-31 LAB — URINE CULTURE: CULTURE: NO GROWTH

## 2016-10-31 NOTE — Progress Notes (Signed)
Final EKG done 10/30/16-epic

## 2016-10-31 NOTE — Progress Notes (Signed)
Urine culture final done 10/30/16 faxed via epic to Dr [name] .

## 2016-11-01 ENCOUNTER — Ambulatory Visit (HOSPITAL_COMMUNITY): Payer: Medicare Other | Admitting: Anesthesiology

## 2016-11-01 ENCOUNTER — Observation Stay (HOSPITAL_COMMUNITY)
Admission: RE | Admit: 2016-11-01 | Discharge: 2016-11-02 | Disposition: A | Discharge status: Home / Self Care | Payer: Medicare Other | Source: Ambulatory Visit | Attending: Urology | Admitting: Urology

## 2016-11-01 ENCOUNTER — Encounter (HOSPITAL_COMMUNITY): Payer: Self-pay | Admitting: *Deleted

## 2016-11-01 ENCOUNTER — Encounter (HOSPITAL_COMMUNITY)
Admission: RE | Disposition: A | Discharge status: Home / Self Care | Payer: Self-pay | Source: Ambulatory Visit | Attending: Urology

## 2016-11-01 DIAGNOSIS — Z79899 Other long term (current) drug therapy: Secondary | ICD-10-CM | POA: Diagnosis not present

## 2016-11-01 DIAGNOSIS — C61 Malignant neoplasm of prostate: Secondary | ICD-10-CM

## 2016-11-01 DIAGNOSIS — Z6839 Body mass index (BMI) 39.0-39.9, adult: Secondary | ICD-10-CM | POA: Insufficient documentation

## 2016-11-01 DIAGNOSIS — C775 Secondary and unspecified malignant neoplasm of intrapelvic lymph nodes: Secondary | ICD-10-CM | POA: Diagnosis present

## 2016-11-01 DIAGNOSIS — Z87891 Personal history of nicotine dependence: Secondary | ICD-10-CM | POA: Diagnosis not present

## 2016-11-01 DIAGNOSIS — I10 Essential (primary) hypertension: Secondary | ICD-10-CM | POA: Insufficient documentation

## 2016-11-01 DIAGNOSIS — N433 Hydrocele, unspecified: Secondary | ICD-10-CM | POA: Diagnosis not present

## 2016-11-01 DIAGNOSIS — K635 Polyp of colon: Secondary | ICD-10-CM | POA: Diagnosis not present

## 2016-11-01 HISTORY — DX: Malignant neoplasm of prostate: C61

## 2016-11-01 HISTORY — PX: LYMPH NODE DISSECTION: SHX5087

## 2016-11-01 HISTORY — PX: ROBOT ASSISTED LAPAROSCOPIC RADICAL PROSTATECTOMY: SHX5141

## 2016-11-01 LAB — TYPE AND SCREEN
ABO/RH(D): B POS
Antibody Screen: NEGATIVE

## 2016-11-01 LAB — HEMOGLOBIN AND HEMATOCRIT, BLOOD
HCT: 39.1 % (ref 39.0–52.0)
HEMOGLOBIN: 13.4 g/dL (ref 13.0–17.0)

## 2016-11-01 SURGERY — PROSTATECTOMY, RADICAL, ROBOT-ASSISTED, LAPAROSCOPIC
Anesthesia: General

## 2016-11-01 MED ORDER — CEFAZOLIN SODIUM-DEXTROSE 2-4 GM/100ML-% IV SOLN
2.0000 g | INTRAVENOUS | Status: AC
  Administered 2016-11-01 (×2): 2 g via INTRAVENOUS

## 2016-11-01 MED ORDER — ACETAMINOPHEN 325 MG PO TABS: 650.0000 mg | ORAL_TABLET | ORAL | Status: DC | PRN

## 2016-11-01 MED ORDER — SUCCINYLCHOLINE CHLORIDE 200 MG/10ML IV SOSY
PREFILLED_SYRINGE | INTRAVENOUS | Status: AC
  Filled 2016-11-01: qty 10

## 2016-11-01 MED ORDER — FENTANYL CITRATE (PF) 100 MCG/2ML IJ SOLN
INTRAMUSCULAR | Status: AC
  Filled 2016-11-01: qty 2

## 2016-11-01 MED ORDER — ONDANSETRON HCL 4 MG/2ML IJ SOLN
INTRAMUSCULAR | Status: AC
  Filled 2016-11-01: qty 2

## 2016-11-01 MED ORDER — OXYBUTYNIN CHLORIDE 5 MG PO TABS: 5.0000 mg | ORAL_TABLET | Freq: Three times a day (TID) | ORAL | Status: DC | PRN

## 2016-11-01 MED ORDER — MIDAZOLAM HCL 2 MG/2ML IJ SOLN
INTRAMUSCULAR | Status: AC
  Filled 2016-11-01: qty 2

## 2016-11-01 MED ORDER — ACETAMINOPHEN 10 MG/ML IV SOLN
1000.0000 mg | Freq: Four times a day (QID) | INTRAVENOUS | Status: DC
  Administered 2016-11-01 – 2016-11-02 (×3): 1000 mg via INTRAVENOUS
  Filled 2016-11-01 (×4): qty 100

## 2016-11-01 MED ORDER — BUPIVACAINE-EPINEPHRINE 0.5% -1:200000 IJ SOLN
INTRAMUSCULAR | Status: DC | PRN
Start: 2016-11-01 — End: 2016-11-01
  Administered 2016-11-01: 14 mL

## 2016-11-01 MED ORDER — DIPHENHYDRAMINE HCL 12.5 MG/5ML PO ELIX: 12.5000 mg | ORAL_SOLUTION | Freq: Four times a day (QID) | ORAL | Status: DC | PRN

## 2016-11-01 MED ORDER — PROPOFOL 10 MG/ML IV BOLUS
INTRAVENOUS | Status: DC | PRN
  Administered 2016-11-01: 200 mg via INTRAVENOUS

## 2016-11-01 MED ORDER — FENTANYL CITRATE (PF) 100 MCG/2ML IJ SOLN
INTRAMUSCULAR | Status: DC | PRN
  Administered 2016-11-01 (×5): 100 ug via INTRAVENOUS

## 2016-11-01 MED ORDER — DEXTROSE-NACL 5-0.45 % IV SOLN
INTRAVENOUS | Status: DC
  Administered 2016-11-01 (×2): via INTRAVENOUS

## 2016-11-01 MED ORDER — HYDROCODONE-ACETAMINOPHEN 5-325 MG PO TABS: 1.0000 | ORAL_TABLET | ORAL | Status: DC | PRN

## 2016-11-01 MED ORDER — ZOLPIDEM TARTRATE 5 MG PO TABS: 5.0000 mg | ORAL_TABLET | Freq: Every evening | ORAL | Status: DC | PRN

## 2016-11-01 MED ORDER — MIDAZOLAM HCL 5 MG/5ML IJ SOLN
INTRAMUSCULAR | Status: DC | PRN
  Administered 2016-11-01: 2 mg via INTRAVENOUS

## 2016-11-01 MED ORDER — PROPOFOL 10 MG/ML IV BOLUS
INTRAVENOUS | Status: AC
  Filled 2016-11-01: qty 20

## 2016-11-01 MED ORDER — SUCCINYLCHOLINE CHLORIDE 200 MG/10ML IV SOSY
PREFILLED_SYRINGE | INTRAVENOUS | Status: DC | PRN
  Administered 2016-11-01: 140 mg via INTRAVENOUS

## 2016-11-01 MED ORDER — ARTIFICIAL TEARS OPHTHALMIC OINT
TOPICAL_OINTMENT | OPHTHALMIC | Status: AC
  Filled 2016-11-01: qty 3.5

## 2016-11-01 MED ORDER — KETOROLAC TROMETHAMINE 30 MG/ML IJ SOLN
15.0000 mg | Freq: Four times a day (QID) | INTRAMUSCULAR | Status: DC
  Administered 2016-11-01 – 2016-11-02 (×3): 15 mg via INTRAVENOUS
  Filled 2016-11-01 (×3): qty 1

## 2016-11-01 MED ORDER — CEFAZOLIN SODIUM-DEXTROSE 2-4 GM/100ML-% IV SOLN
INTRAVENOUS | Status: AC
  Filled 2016-11-01: qty 100

## 2016-11-01 MED ORDER — PROMETHAZINE HCL 25 MG/ML IJ SOLN: 6.2500 mg | INTRAMUSCULAR | Status: DC | PRN

## 2016-11-01 MED ORDER — SODIUM CHLORIDE 0.9 % IJ SOLN
INTRAMUSCULAR | Status: DC | PRN
  Administered 2016-11-01: 20 mL

## 2016-11-01 MED ORDER — LIDOCAINE 2% (20 MG/ML) 5 ML SYRINGE
INTRAMUSCULAR | Status: DC | PRN
  Administered 2016-11-01: 100 mg via INTRAVENOUS

## 2016-11-01 MED ORDER — BUPIVACAINE LIPOSOME 1.3 % IJ SUSP
20.0000 mL | Freq: Once | INTRAMUSCULAR | Status: AC
  Administered 2016-11-01: 20 mL
  Filled 2016-11-01: qty 20

## 2016-11-01 MED ORDER — MORPHINE SULFATE (PF) 4 MG/ML IV SOLN
2.0000 mg | INTRAVENOUS | Status: DC | PRN
  Administered 2016-11-01: 2 mg via INTRAVENOUS
  Filled 2016-11-01: qty 1

## 2016-11-01 MED ORDER — DEXAMETHASONE SODIUM PHOSPHATE 10 MG/ML IJ SOLN
INTRAMUSCULAR | Status: AC
  Filled 2016-11-01: qty 1

## 2016-11-01 MED ORDER — ROCURONIUM BROMIDE 10 MG/ML (PF) SYRINGE
PREFILLED_SYRINGE | INTRAVENOUS | Status: DC | PRN
  Administered 2016-11-01: 10 mg via INTRAVENOUS
  Administered 2016-11-01: 20 mg via INTRAVENOUS
  Administered 2016-11-01: 10 mg via INTRAVENOUS
  Administered 2016-11-01: 20 mg via INTRAVENOUS
  Administered 2016-11-01: 5 mg via INTRAVENOUS
  Administered 2016-11-01: 10 mg via INTRAVENOUS
  Administered 2016-11-01: 50 mg via INTRAVENOUS

## 2016-11-01 MED ORDER — SODIUM CHLORIDE 0.9 % IV BOLUS (SEPSIS)
1000.0000 mL | Freq: Once | INTRAVENOUS | Status: AC
  Administered 2016-11-01: 1000 mL via INTRAVENOUS

## 2016-11-01 MED ORDER — SODIUM CHLORIDE 0.9 % IJ SOLN
INTRAMUSCULAR | Status: AC
Start: 2016-11-01 — End: 2016-11-01
  Filled 2016-11-01: qty 20

## 2016-11-01 MED ORDER — DEXAMETHASONE SODIUM PHOSPHATE 10 MG/ML IJ SOLN
INTRAMUSCULAR | Status: DC | PRN
  Administered 2016-11-01: 10 mg via INTRAVENOUS

## 2016-11-01 MED ORDER — LACTATED RINGERS IR SOLN
Status: DC | PRN
  Administered 2016-11-01: 1000 mL

## 2016-11-01 MED ORDER — HYDROMORPHONE HCL 1 MG/ML IJ SOLN
INTRAMUSCULAR | Status: AC
  Filled 2016-11-01: qty 2

## 2016-11-01 MED ORDER — LACTATED RINGERS IV SOLN
INTRAVENOUS | Status: DC | PRN
  Administered 2016-11-01 (×3): via INTRAVENOUS

## 2016-11-01 MED ORDER — SUGAMMADEX SODIUM 500 MG/5ML IV SOLN
INTRAVENOUS | Status: DC | PRN
  Administered 2016-11-01: 250 mg via INTRAVENOUS

## 2016-11-01 MED ORDER — ROCURONIUM BROMIDE 50 MG/5ML IV SOSY
PREFILLED_SYRINGE | INTRAVENOUS | Status: AC
  Filled 2016-11-01: qty 5

## 2016-11-01 MED ORDER — ONDANSETRON HCL 4 MG/2ML IJ SOLN
INTRAMUSCULAR | Status: DC | PRN
  Administered 2016-11-01 (×2): 4 mg via INTRAVENOUS

## 2016-11-01 MED ORDER — ROCURONIUM BROMIDE 50 MG/5ML IV SOSY
PREFILLED_SYRINGE | INTRAVENOUS | Status: AC
Start: 2016-11-01 — End: 2016-11-01
  Filled 2016-11-01: qty 5

## 2016-11-01 MED ORDER — HYDROMORPHONE HCL 1 MG/ML IJ SOLN
0.2500 mg | INTRAMUSCULAR | Status: DC | PRN
Start: 2016-11-01 — End: 2016-11-01

## 2016-11-01 MED ORDER — DIPHENHYDRAMINE HCL 50 MG/ML IJ SOLN: 12.5000 mg | Freq: Four times a day (QID) | INTRAMUSCULAR | Status: DC | PRN

## 2016-11-01 MED ORDER — HYDROCHLOROTHIAZIDE 25 MG PO TABS
25.0000 mg | ORAL_TABLET | Freq: Every day | ORAL | Status: DC
Start: 2016-11-02 — End: 2016-11-02

## 2016-11-01 MED ORDER — BUPIVACAINE-EPINEPHRINE (PF) 0.25% -1:200000 IJ SOLN
INTRAMUSCULAR | Status: AC
  Filled 2016-11-01: qty 30

## 2016-11-01 MED ORDER — LIDOCAINE 2% (20 MG/ML) 5 ML SYRINGE
INTRAMUSCULAR | Status: AC
  Filled 2016-11-01: qty 5

## 2016-11-01 MED ORDER — SUGAMMADEX SODIUM 500 MG/5ML IV SOLN
INTRAVENOUS | Status: AC
  Filled 2016-11-01: qty 5

## 2016-11-01 MED ORDER — SENNOSIDES-DOCUSATE SODIUM 8.6-50 MG PO TABS
2.0000 | ORAL_TABLET | Freq: Every day | ORAL | Status: DC
  Administered 2016-11-01: 2 via ORAL
  Filled 2016-11-01: qty 2

## 2016-11-01 MED ORDER — ONDANSETRON HCL 4 MG/2ML IJ SOLN: 4.0000 mg | INTRAMUSCULAR | Status: DC | PRN

## 2016-11-01 SURGICAL SUPPLY — 59 items
ADH SKN CLS APL DERMABOND .7 (GAUZE/BANDAGES/DRESSINGS) ×2
APL SRG 38 LTWT LNG FL B (MISCELLANEOUS) ×2
APPLICATOR ARISTA FLEXITIP XL (MISCELLANEOUS) ×2 IMPLANT
CATH FOLEY 2WAY SLVR 18FR 30CC (CATHETERS) ×4 IMPLANT
CATH TIEMANN FOLEY 18FR 5CC (CATHETERS) ×4 IMPLANT
CHLORAPREP W/TINT 26ML (MISCELLANEOUS) ×4 IMPLANT
CLIP LIGATING HEM O LOK PURPLE (MISCELLANEOUS) ×12 IMPLANT
COVER SURGICAL LIGHT HANDLE (MISCELLANEOUS) ×4 IMPLANT
COVER TIP SHEARS 8 DVNC (MISCELLANEOUS) ×2 IMPLANT
COVER TIP SHEARS 8MM DA VINCI (MISCELLANEOUS) ×4
CUTTER ECHEON FLEX ENDO 45 340 (ENDOMECHANICALS) ×4 IMPLANT
DECANTER SPIKE VIAL GLASS SM (MISCELLANEOUS) ×2 IMPLANT
DERMABOND ADVANCED (GAUZE/BANDAGES/DRESSINGS) ×2
DERMABOND ADVANCED .7 DNX12 (GAUZE/BANDAGES/DRESSINGS) ×2 IMPLANT
DRAPE ARM DVNC X/XI (DISPOSABLE) ×8 IMPLANT
DRAPE COLUMN DVNC XI (DISPOSABLE) ×2 IMPLANT
DRAPE DA VINCI XI ARM (DISPOSABLE) ×8
DRAPE DA VINCI XI COLUMN (DISPOSABLE) ×2
DRAPE SURG IRRIG POUCH 19X23 (DRAPES) ×4 IMPLANT
DRSG TEGADERM 4X4.75 (GAUZE/BANDAGES/DRESSINGS) ×4 IMPLANT
ELECT REM PT RETURN 15FT ADLT (MISCELLANEOUS) ×4 IMPLANT
GAUZE SPONGE 2X2 8PLY STRL LF (GAUZE/BANDAGES/DRESSINGS) IMPLANT
GLOVE BIO SURGEON STRL SZ 6.5 (GLOVE) ×2 IMPLANT
GLOVE BIO SURGEONS STRL SZ 6.5 (GLOVE)
GLOVE BIOGEL M STRL SZ7.5 (GLOVE) ×8 IMPLANT
GOWN STRL REUS W/TWL LRG LVL3 (GOWN DISPOSABLE) ×8 IMPLANT
GOWN STRL REUS W/TWL XL LVL3 (GOWN DISPOSABLE) ×8 IMPLANT
HEMOSTAT ARISTA ABSORB 3G PWDR (MISCELLANEOUS) ×2 IMPLANT
HEMOSTAT SURGICEL 4X8 (HEMOSTASIS) ×2 IMPLANT
HOLDER FOLEY CATH W/STRAP (MISCELLANEOUS) ×4 IMPLANT
IRRIG SUCT STRYKERFLOW 2 WTIP (MISCELLANEOUS) ×4
IRRIGATION SUCT STRKRFLW 2 WTP (MISCELLANEOUS) ×2 IMPLANT
IV LACTATED RINGERS 1000ML (IV SOLUTION) ×2 IMPLANT
PACK ROBOT UROLOGY CUSTOM (CUSTOM PROCEDURE TRAY) ×4 IMPLANT
PAD POSITIONING PINK XL (MISCELLANEOUS) ×2 IMPLANT
PORT ACCESS TROCAR AIRSEAL 12 (TROCAR) IMPLANT
PORT ACCESS TROCAR AIRSEAL 5M (TROCAR) ×2
RELOAD STAPLE 45 4.1 GRN THCK (STAPLE) ×2 IMPLANT
SEAL CANN UNIV 5-8 DVNC XI (MISCELLANEOUS) ×8 IMPLANT
SEAL XI 5MM-8MM UNIVERSAL (MISCELLANEOUS) ×8
SET TRI-LUMEN FLTR TB AIRSEAL (TUBING) ×2 IMPLANT
SOLUTION ELECTROLUBE (MISCELLANEOUS) ×4 IMPLANT
SPONGE GAUZE 2X2 STER 10/PKG (GAUZE/BANDAGES/DRESSINGS)
SPONGE LAP 4X18 X RAY DECT (DISPOSABLE) ×2 IMPLANT
STAPLE RELOAD 45 GRN (STAPLE) ×2 IMPLANT
STAPLE RELOAD 45MM GREEN (STAPLE) ×4
SUT ETHILON 3 0 PS 1 (SUTURE) ×4 IMPLANT
SUT MNCRL AB 4-0 PS2 18 (SUTURE) ×8 IMPLANT
SUT V-LOC BARB 180 2/0GR6 GS22 (SUTURE) ×4
SUT VIC AB 0 CT1 27 (SUTURE) ×4
SUT VIC AB 0 CT1 27XBRD ANTBC (SUTURE) ×2 IMPLANT
SUT VIC AB 2-0 SH 27 (SUTURE) ×4
SUT VIC AB 2-0 SH 27X BRD (SUTURE) ×2 IMPLANT
SUT VICRYL 0 UR6 27IN ABS (SUTURE) ×8 IMPLANT
SUT VLOC BARB 180 ABS3/0GR12 (SUTURE) ×8
SUTURE V-LC BRB 180 2/0GR6GS22 (SUTURE) ×2 IMPLANT
SUTURE VLOC BRB 180 ABS3/0GR12 (SUTURE) ×4 IMPLANT
TOWEL OR NON WOVEN STRL DISP B (DISPOSABLE) ×4 IMPLANT
WATER STERILE IRR 1000ML POUR (IV SOLUTION) ×2 IMPLANT

## 2016-11-01 NOTE — Anesthesia Procedure Notes (Signed)
Procedure Name: Intubation Date/Time: 11/01/2016 8:26 AM Performed by: Lind Covert Pre-anesthesia Checklist: Patient identified, Emergency Drugs available, Suction available, Patient being monitored and Timeout performed Patient Re-evaluated:Patient Re-evaluated prior to inductionOxygen Delivery Method: Circle system utilized Preoxygenation: Pre-oxygenation with 100% oxygen Intubation Type: IV induction Ventilation: Mask ventilation without difficulty Laryngoscope Size: Mac and 4 Grade View: Grade II Tube type: Oral Tube size: 7.5 mm Number of attempts: 1 Airway Equipment and Method: Stylet Placement Confirmation: ETT inserted through vocal cords under direct vision,  positive ETCO2 and breath sounds checked- equal and bilateral Secured at: 23 cm Tube secured with: Tape Dental Injury: Teeth and Oropharynx as per pre-operative assessment

## 2016-11-01 NOTE — Anesthesia Postprocedure Evaluation (Signed)
Anesthesia Post Note  Patient: Luis Knapp  Procedure(s) Performed: Procedure(s) (LRB): XI ROBOTIC ASSISTED LAPAROSCOPIC RADICAL PROSTATECTOMY (N/A) BILATERAL PELVIC LYMPH NODE DISSECTION (Bilateral)     Patient location during evaluation: PACU Anesthesia Type: General Level of consciousness: awake and alert Pain management: pain level controlled Vital Signs Assessment: post-procedure vital signs reviewed and stable Respiratory status: spontaneous breathing, nonlabored ventilation, respiratory function stable and patient connected to nasal cannula oxygen Cardiovascular status: blood pressure returned to baseline and stable Postop Assessment: no signs of nausea or vomiting Anesthetic complications: no    Last Vitals:  Vitals:   11/01/16 1430 11/01/16 1445  BP: (!) 146/70 (!) 147/73  Pulse: 98 95  Resp: 18 13  Temp:      Last Pain:  Vitals:   11/01/16 1445  TempSrc:   PainSc: 0-No pain                 Jameia Makris S

## 2016-11-01 NOTE — Anesthesia Preprocedure Evaluation (Signed)
Anesthesia Evaluation  Patient identified by MRN, date of birth, ID band Patient awake    Reviewed: Allergy & Precautions, NPO status , Patient's Chart, lab work & pertinent test results  Airway Mallampati: II  TM Distance: <3 FB Neck ROM: Full    Dental no notable dental hx.    Pulmonary neg pulmonary ROS, former smoker,    Pulmonary exam normal breath sounds clear to auscultation       Cardiovascular hypertension, Normal cardiovascular exam Rhythm:Regular Rate:Normal     Neuro/Psych negative neurological ROS  negative psych ROS   GI/Hepatic negative GI ROS, Neg liver ROS,   Endo/Other  Morbid obesity  Renal/GU negative Renal ROS  negative genitourinary   Musculoskeletal negative musculoskeletal ROS (+)   Abdominal   Peds negative pediatric ROS (+)  Hematology negative hematology ROS (+)   Anesthesia Other Findings   Reproductive/Obstetrics negative OB ROS                             Anesthesia Physical Anesthesia Plan  ASA: III  Anesthesia Plan: General   Post-op Pain Management:    Induction: Intravenous  PONV Risk Score and Plan: 1 and Ondansetron and Dexamethasone  Airway Management Planned: Oral ETT  Additional Equipment:   Intra-op Plan:   Post-operative Plan: Extubation in OR  Informed Consent: I have reviewed the patients History and Physical, chart, labs and discussed the procedure including the risks, benefits and alternatives for the proposed anesthesia with the patient or authorized representative who has indicated his/her understanding and acceptance.   Dental advisory given  Plan Discussed with: CRNA and Surgeon  Anesthesia Plan Comments:         Anesthesia Quick Evaluation

## 2016-11-01 NOTE — H&P (Signed)
f/u to monitor Prostate Cancer  HPI: Luis Knapp is a 66 year-old male established patient who is here for interval evaluation of his prostate cancer.  He was diagnosed with prostate cancer in approximately 08/24/2016. The patient's gleason score was: 3+4=7. Pretreatment PSA: 4.58.   Patient here to discuss his recent diagnosis of prostate cancer.  4/12 cores positive - 1/12 gleason 3+4=7.  vol. 39gm -no median lobe  IPSS - 1  SHIM 18   79yr cancer free with RALP - 82%  LNI - 3%  SVI- 3%  ECE - 51%   Interval: The patient recovered well from this procedure. No hematuria, dysuria, fevers or chills. His voiding symptoms are back to normal. He does have some hematospermia. No issues with blood in his stool.     ALLERGIES: No Allergies    MEDICATIONS: Hydrochlorothiazide 12.5 mg tablet Oral     GU PSH: Hydrocele repair - 2015 Prostate Needle Biopsy - 08/25/2016      PSH Notes: Surgery Tunica Vaginalis Excision Of Hydrocele Right, No Surgical Problems   NON-GU PSH: Surgical Pathology, Gross And Microscopic Examination For Prostate Needle - 08/25/2016    GU PMH: Elevated PSA - 07/10/2016, (Stable), We will continue to monitor his PSA as the rise has been subtle and the patient is not quite ready to proceed with biopsy., - 03/20/2016, - 12/13/2015 Hydrocele, unspecified, Hydrocele of testis - 2015    NON-GU PMH: Encounter for general adult medical examination without abnormal findings, Encounter for preventive health examination - 2015 Personal history of other diseases of the circulatory system, History of hypertension - 2015    FAMILY HISTORY: Myocardial Infarction - Runs In Family renal failure - Runs In Family   SOCIAL HISTORY: Marital Status: Married Current Smoking Status: Patient does not smoke anymore.  Social Drinker.  Drinks 2 caffeinated drinks per day.     Notes: Occupation, Caffeine use, Alcohol use, Two children, Married, Former smoker, Death in the family,  mother, Death in the family, father   REVIEW OF SYSTEMS:    GU Review Male:   Patient denies frequent urination, hard to postpone urination, burning/ pain with urination, get up at night to urinate, leakage of urine, stream starts and stops, trouble starting your stream, have to strain to urinate , erection problems, and penile pain.  Gastrointestinal (Upper):   Patient denies nausea, vomiting, and indigestion/ heartburn.  Gastrointestinal (Lower):   Patient denies diarrhea and constipation.  Constitutional:   Patient denies fever, night sweats, weight loss, and fatigue.  Skin:   Patient denies skin rash/ lesion and itching.  Eyes:   Patient denies blurred vision and double vision.  Ears/ Nose/ Throat:   Patient denies sore throat and sinus problems.  Hematologic/Lymphatic:   Patient denies swollen glands and easy bruising.  Cardiovascular:   Patient denies leg swelling and chest pains.  Respiratory:   Patient denies cough and shortness of breath.  Endocrine:   Patient denies excessive thirst.  Musculoskeletal:   Patient denies back pain and joint pain.  Neurological:   Patient denies headaches and dizziness.  Psychologic:   Patient denies depression and anxiety.   VITAL SIGNS: None   PAST DATA REVIEWED:  Source Of History:  Patient  Lab Test Review:   PSA  Records Review:   Pathology Reports, Previous Patient Records   07/05/16 03/14/16 12/13/15  PSA  Total PSA 4.58 ng/dl 4.31  4.18   Free PSA 0.63 ng/dl 0.61  0.77   % Free PSA 14 %  14  18     PROCEDURES:          Urinalysis w/Scope Dipstick Dipstick Cont'd Micro  Color: Yellow Bilirubin: Neg WBC/hpf: NS (Not Seen)  Appearance: Cloudy Ketones: Neg RBC/hpf: 10 - 20/hpf  Specific Gravity: 1.020 Blood: 3+ Bacteria: NS (Not Seen)  pH: 7.0 Protein: Neg Cystals: Amorph Urates  Glucose: Neg Urobilinogen: 0.2 Casts: NS (Not Seen)    Nitrites: Neg Trichomonas: Not Present    Leukocyte Esterase: Neg Mucous: Not Present       Epithelial Cells: NS (Not Seen)      Yeast: NS (Not Seen)      Sperm: Not Present    ASSESSMENT:      ICD-10 Details  1 GU:   Prostate Cancer - C61      PLAN:           Schedule Return Visit/Planned Activity: Return PRN - Office Visit             Note: We will touch base in a few weeks to discuss additional treatment options that the patient would like to discuss and any referrals if he would like to pursue them.          Document Letter(s):  Created for Patient: Clinical Summary    The patient was counseled about the natural history of prostate cancer and the standard treatment options that are available for prostate cancer. It was explained to him how his age and life expectancy, clinical stage, Gleason score, and PSA affect his prognosis, the decision to proceed with additional staging studies, as well as how that information influences recommended treatment strategies. We discussed the roles for active surveillance, radiation therapy, surgical therapy, androgen deprivation, as well as ablative therapy options for the treatment of prostate cancer as appropriate to his individual cancer situation. We discussed the risks and benefits of these options with regard to their impact on cancer control and also in terms of potential adverse events, complications, and impact on quality of life particularly related to urinary, bowel, and sexual function. The patient was encouraged to ask questions throughout the discussion today and all questions were answered to his stated satisfaction. In addition, the patient was provided with and/or directed to appropriate resources and literature for further education about prostate cancer and treatment options.        Notes:   The patient has small volume intermediate risk prostate cancer. I discussed with him active surveillance, brachytherapy, external beam radiation therapy, and surgery. The patient will think about his options and the back to [place]. I'll  follow-up with him 2 weeks.

## 2016-11-01 NOTE — Transfer of Care (Signed)
Immediate Anesthesia Transfer of Care Note  Patient: Luis Knapp  Procedure(s) Performed: Procedure(s): XI ROBOTIC ASSISTED LAPAROSCOPIC RADICAL PROSTATECTOMY (N/A) BILATERAL PELVIC LYMPH NODE DISSECTION (Bilateral)  Patient Location: PACU  Anesthesia Type:General  Level of Consciousness: sedated  Airway & Oxygen Therapy: Patient Spontanous Breathing and Patient connected to face mask oxygen  Post-op Assessment: Report given to RN and Post -op Vital signs reviewed and stable  Post vital signs: Reviewed and stable  Last Vitals:  Vitals:   11/01/16 0618  BP: (!) 182/80  Pulse: 93  Resp: 20  Temp: 36.7 C    Last Pain:  Vitals:   11/01/16 0652  TempSrc:   PainSc: 0-No pain      Patients Stated Pain Goal: 3 (28/76/81 1572)  Complications: No apparent anesthesia complications

## 2016-11-01 NOTE — Op Note (Signed)
Preoperative diagnosis:  1. Prostate Cancer   Postoperative diagnosis:  1. same   Procedure: 1. Robotic assisted laparoscopic radical prostatectomy 2. Bilateral pelvic lymph node dissection  Surgeon: Ardis Hughs, MD First Assistant: Lorayne Bender, MD2  Anesthesia: General  Complications: None  Intraoperative findings: bilateral nerve spare.  Capsular tear on right lateral apex.  Nice clean anastomosis  EBL: Minimal  Specimens:  #1.  Prostate and seminal vesicals #2.  Bilateral pelvic lymph nodes  Indication: Luis Knapp is a 66 y.o. patient with prostate cancer.  After reviewing the management options for treatment, he elected to proceed with the removal of his prostate. We have discussed the potential benefits and risks of the procedure, side effects of the proposed treatment, the likelihood of the patient achieving the goals of the procedure, and any potential problems that might occur during the procedure or recuperation. Informed consent has been obtained.  Description of procedure:  The patient was consented in the preoperative holding area. He was in brought back to the operating room placed the table in supine position. General anesthesia was then induced and endotracheal tube was inserted. He was then placed in dorsolithotomy position and placed in steep Trendelenburg. He was then prepped and draped in the routine sterile fashion. We, the first assistant and I, then began by making a 10 mm incision supraumbilical midline incision the skin down through into the peritoneum. Then placed a 8 mm trocar. I then inflated the abdomen and inserted the 0 robotic lens. We then placed 2 additional a 8 millimeter trochars in the patient's left lower abdomen proximally 9 cm apart and 2 trochars on the patient's right lower abdomen, one was an 8 mm trocar and the one most lateral was a 12 mm trocar which was used as the assistant port. A 5 mm trocar was placed by triangulating the 2  right lateral ports as a second assistant port. These ports were all placed under visual guidance. Once the ports were noted to be satisfactory position the robot was docked. We started with the 0 lens, monopolar scissors in the right hand and the [place] forceps the left hand as well as a fenestrated grasper as the third arm on the left-hand side.   We, the first assistant and I,  began our dissection the posterior plane incising the peritoneum at the level of the vas deferens. Isolated the left vas deferens and dissected it proximally towards the spermatic cord for 5 cm prior to ligating it. Then used this as traction to isolate the left the seminal vesicle which was then undressed bluntly and completely dissected out, all vessels were cauterized with a combination of bipolar and the monopolar scissors. We then turned our attention to the right side and similarly dissected out the right vas deferens and seminal vesicle. Once the SVs had been freed, we turned our attention to the posterior plane and bluntly dissected the tissue between the rectum and the posterior wall of the prostate bluntly out towards the apex.    At this point the bladder was taken down starting at the urachal remnant with a combination of both blunt dissection and sharp dissection using monopolar cautery the bladder was dropped down in the usual fashion to the medial umbilical ligaments laterally and the dorsal vein of the prostate anteriorly creating our space of Retzius. We then turned our attention to the endopelvic fascia which was incised laterally starting on the patient's right-hand side the levator muscles were pushed off the prostate laterally  up towards the dorsal vein complex on the right-hand side. This process was then repeated on the left-hand side and a nice notch was created for the dorsal vein. I then used a 60mm stapler to staple the dorsal vein.   We,the first assistant and I, then located the bladder neck at the  vesicoprostatic junction and using the monopolar scissors dissected down through the perivesical tissues and the bladder neck down to the prostatic urethra. The catheter was then deflated and pulled through our urethral opening and then used to retract the prostate anteriorly for the posterior bladder neck dissection. Once through the bladder neck and into the posterior plane of the prostate, the SVs were brought through the opening. The left pedicle was then isolated and systematically ligated with Weck clips and scissors. The nerve bundle was then peeled off the posterior lateral aspect of the prostate and bluntly dissected away off the prostate. This was then repeated on the right side.    I then came down through the dorsal venous complex anteriorly down to the membranous urethra using the monopolar. Once down to the urethra, it was transected sharply and the apex of the prostate was then dissected off the levator and rectourethralis muscles. Once the apex of the prostate had been dissected free we came back to the base of the prostate and bluntly push the rectum and nerve vascular bundle off the prostate the patient's left and used clips on the patient's right to free the prostate. Once the prostate was free it was placed off to the side. The pelvis was then irrigated with normal saline and noted to be relatively hemostatic.  Attention was then turned to the right pelvic sidewall. The fibrofatty tissue between the external iliac vein, confluence of the iliac vessels, hypogastric artery, and Cooper's ligament was dissected free from the pelvic sidewall with care to preserve the obturator nerve. Weck clips were used for lymphostasis and hemostasis. An identical procedure was performed on the contralateral side and the lymphatic packets were removed for permanent pathologic analysis.  The prostate and both lymph node tissues were placed in the Endo Catch bag and the string brought to the 5 mm port.    The  vesicourethral anastomosis was then completed with 2 interlocking 3-0 V. lock sutures running the anastomosis in the 6:00 position to the 12:00 position on each side and then tying it off on the top. The final catheter was then passed through the patient's urethra and into the bladder and 120 cc was instilled into the bladder to test the anastomosis. As there was no leak a 14 [place] Blake drain was passed through the left lateral port and placed around the vesicourethral anastomosis. A 12 mm assistant port on the right lateral side was then closed with 0 Vicryl with the help of the [employer] needle. The 12 mm midline infraumbilical incision was then extended another centimeter taken down and the fascia opened to remove the Endo Catch bag with the prostate specimen. The fascia was then closed with a 0 Vicryl and all skin ports were closed with 4-0 Monocryl in a subcutaneous fashion. Dermabond glue was then applied to the incisions. The drain was then secured to the skin with a 0 nylon stitch and dressing applied.   At the end of the case all laps needles and sponges had been accounted for. There no immediate complications. The patient returned to the PACU in stable condition.

## 2016-11-02 DIAGNOSIS — Z79899 Other long term (current) drug therapy: Secondary | ICD-10-CM | POA: Diagnosis not present

## 2016-11-02 DIAGNOSIS — Z6839 Body mass index (BMI) 39.0-39.9, adult: Secondary | ICD-10-CM | POA: Diagnosis not present

## 2016-11-02 DIAGNOSIS — Z87891 Personal history of nicotine dependence: Secondary | ICD-10-CM | POA: Diagnosis not present

## 2016-11-02 DIAGNOSIS — C61 Malignant neoplasm of prostate: Secondary | ICD-10-CM | POA: Diagnosis not present

## 2016-11-02 DIAGNOSIS — I10 Essential (primary) hypertension: Secondary | ICD-10-CM | POA: Diagnosis not present

## 2016-11-02 LAB — BASIC METABOLIC PANEL
ANION GAP: 10 (ref 5–15)
BUN: 15 mg/dL (ref 6–20)
CHLORIDE: 100 mmol/L — AB (ref 101–111)
CO2: 26 mmol/L (ref 22–32)
Calcium: 7.9 mg/dL — ABNORMAL LOW (ref 8.9–10.3)
Creatinine, Ser: 1.11 mg/dL (ref 0.61–1.24)
GFR calc Af Amer: >60 mL/min (ref >60)
GLUCOSE: 134 mg/dL — AB (ref 65–99)
POTASSIUM: 3 mmol/L — AB (ref 3.5–5.1)
SODIUM: 136 mmol/L (ref 135–145)

## 2016-11-02 LAB — HEMOGLOBIN AND HEMATOCRIT, BLOOD
HCT: 34.6 % — ABNORMAL LOW (ref 39.0–52.0)
HEMOGLOBIN: 12.2 g/dL — AB (ref 13.0–17.0)

## 2016-11-02 NOTE — Discharge Instructions (Signed)
[facility] for fever >101.12F by mouth, uncontrolled nausea or vomiting, pain uncontrolled by medication, decreased urine output, signs of wound infection (rapidly spreading redness/swelling, purulent discharge, increasing bleeding from wounds, or separation of wound); also call for any new or concerning symptoms.   You may shower, but do not immerse (take a bath or swim) your wounds for 2 weeks.  Wash the surgical site with mild soap and water, but do not scrub vigorously. Pat dry.   Advance your diet slowly. Avoid spicy and fried foods, raw vegetables, and dairy for a few days.  Do not drive while taking narcotic pain medications. Take Colace and/or Senna while taking narcotic pain medication. You may take over-the-counter Laxatives such as Miralax, Senna, or Milk of Magnesia. Stop taking these medications if you develop diarrhea.  Take all medications as prescribed.  No strenuous activity or heavy lifting (greater than 10 lbs or about the weight of 1 gallon of milk) for 4-6 weeks  Call the clinic to confirm your follow-up appointment if not already made.

## 2016-11-02 NOTE — Discharge Summary (Signed)
Date of admission: 11/01/2016  Date of discharge: 11/02/2016  Admission diagnosis: Prostate cancer  Discharge diagnosis: Same  History and Physical: For full details, please see admission history and physical. Briefly, Luis Knapp is a 66 y.o. male with prostate cancer. After discussing management/treatment options, he  elected to proceed with surgical treatment.  Hospital Course: Luis Knapp was taken to the operating room on 11/01/2016 and underwent a robotic prostatectomy and lymph node dissection. He tolerated this procedure well and without complications. Postoperatively, the patient was able to be transferred to a regular hospital room following recovery from anesthesia.  They were able to begin ambulating the night of surgery and remained hemodynamically stable overnight.  On POD#1, he was doing well. His vitals were stable. His drain output was low and thus was removed. They was transitioned to oral pain medication, tolerated a regular diet, and had met all discharge criteria and was able to be discharged home on POD#1.  Laboratory values:  Recent Labs  10/30/16 0814 11/01/16 1430 11/02/16 0611  HGB 15.3 13.4 12.2*  HCT 44.9 39.1 34.6*    Disposition: Home  Discharge instruction: They were instructed to be ambulatory but to refrain from heavy lifting, strenuous activity, or driving.  Discharge medications:  Allergies as of 11/02/2016   No Known Allergies     Medication List    TAKE these medications   calcium carbonate 500 MG chewable tablet Commonly known as:  TUMS - dosed in mg elemental calcium Chew 1 tablet by mouth 3 (three) times daily as needed for indigestion or heartburn.   hydrochlorothiazide 25 MG tablet Commonly known as:  HYDRODIURIL TAKE 1 TABLET (25 MG TOTAL) BY MOUTH DAILY.   MULTIVITAMIN PO Take 1 tablet by mouth daily.       Followup: He will follow-up as scheduled for trial of void.

## 2016-11-02 NOTE — Care Management Note (Signed)
Case Management Note  Patient Details  Name: Luis Knapp MRN: [id number] Date of Birth: 02/27/1951  Subjective/Objective:  66 y/o m admitted w/Prostate Ca. From home. No CM needs.                  Action/Plan:d/c home.   Expected Discharge Date:  11/02/16               Expected Discharge Plan:  Home/Self Care  In-House Referral:     Discharge planning Services  CM Consult  Post Acute Care Choice:    Choice offered to:     DME Arranged:    DME Agency:     HH Arranged:    HH Agency:     Status of Service:  Completed, signed off  If discussed at [employer] of Stay Meetings, dates discussed:    Additional Comments:  Dessa Phi, RN 11/02/2016, 9:41 AM

## 2016-11-02 NOTE — Progress Notes (Signed)
Dr. [name] notified of patients potassium being 3.0, no new orders given. Will discharge patient.

## 2016-11-11 ENCOUNTER — Emergency Department (HOSPITAL_COMMUNITY)
Admission: EM | Admit: 2016-11-11 | Discharge: 2016-11-11 | Disposition: A | Discharge status: Home / Self Care | Payer: Medicare Other | Attending: Emergency Medicine | Admitting: Emergency Medicine

## 2016-11-11 ENCOUNTER — Emergency Department (HOSPITAL_COMMUNITY): Payer: Medicare Other

## 2016-11-11 ENCOUNTER — Encounter (HOSPITAL_COMMUNITY): Payer: Self-pay | Admitting: Emergency Medicine

## 2016-11-11 DIAGNOSIS — R39198 Other difficulties with micturition: Secondary | ICD-10-CM | POA: Diagnosis not present

## 2016-11-11 DIAGNOSIS — K5900 Constipation, unspecified: Secondary | ICD-10-CM

## 2016-11-11 DIAGNOSIS — Z79899 Other long term (current) drug therapy: Secondary | ICD-10-CM | POA: Insufficient documentation

## 2016-11-11 DIAGNOSIS — I10 Essential (primary) hypertension: Secondary | ICD-10-CM | POA: Insufficient documentation

## 2016-11-11 DIAGNOSIS — Z8546 Personal history of malignant neoplasm of prostate: Secondary | ICD-10-CM | POA: Insufficient documentation

## 2016-11-11 DIAGNOSIS — Z87891 Personal history of nicotine dependence: Secondary | ICD-10-CM | POA: Diagnosis not present

## 2016-11-11 DIAGNOSIS — R3129 Other microscopic hematuria: Secondary | ICD-10-CM | POA: Insufficient documentation

## 2016-11-11 DIAGNOSIS — R109 Unspecified abdominal pain: Secondary | ICD-10-CM | POA: Diagnosis present

## 2016-11-11 LAB — URINALYSIS, ROUTINE W REFLEX MICROSCOPIC
BACTERIA UA: NONE SEEN
Bilirubin Urine: NEGATIVE
Glucose, UA: NEGATIVE mg/dL
Ketones, ur: NEGATIVE mg/dL
LEUKOCYTES UA: NEGATIVE
NITRITE: NEGATIVE
PH: 7 (ref 5.0–8.0)
Protein, ur: NEGATIVE mg/dL
SPECIFIC GRAVITY, URINE: 1.009 (ref 1.005–1.030)
Squamous Epithelial / LPF: NONE SEEN

## 2016-11-11 NOTE — ED Provider Notes (Signed)
Medical screening examination/treatment/procedure(s) were conducted as a shared visit with non-physician practitioner(s) and myself.  I personally evaluated the patient during the encounter.  66 year old male who had a radical prostatectomy 8 days ago had a catheter removed a few days ago and then constipation started 2 days ago. He took some magnesium citrate with improvement in his constipation but had suprapubic pain and a feeling of incomplete micturition. Did not urinate throughout the night drinking today but had a urination in the waiting room. Urinate again prior to my evaluation. He is asymptomatic at this time. Plan for UA, discuss with urology.     Shawna Kiener, Corene Cornea, MD 11/12/16 [phone]

## 2016-11-11 NOTE — ED Triage Notes (Addendum)
Pt had prostate surgery 10 days ago. Had catheter removed 3 days ago. Was constipated after catheter was removed. Took laxatives which resolved constipation, however pt began to have difficulty urinating yesterday. Feels like he has to urinate, but only a little urine comes out. Bladder scan shows no retained urine. Urinated a little just prior to arrival which relieved feeling that he was retaining urine.

## 2016-11-11 NOTE — ED Provider Notes (Signed)
[facility] DEPT Provider Note   CSN: [id number] Arrival date & time: 11/11/16  1029     History   Chief Complaint Chief Complaint  Patient presents with  . Abdominal Pain    HPI Luis Knapp is a 66 y.o. male.  HPI  Luis Knapp is a 66 y.o. male history of prostate cancer, hypertension, diverticulosis, presents to emergency department complaining of difficulty urinating. Patient is status post robot assisted laparoscopic radical prostatectomy on 11/01/16. He states that they remove his Foley just 2 days ago. He is doing well for about 24 hours, but states yesterday started having difficulty urinating. His history is that when he was urinating only small amount, about. He did feel constipated and took a laxative, magnesium citrate yesterday. He had 4 bowel movements overnight. He states this morning he couldn't urinate at all so that prompted him to come to emergency department. He states while in the waiting room he was able to urinate a "good amount." He states now he is symptom-free. He denies any fever or chills. No nausea or vomiting. Minimal abdominal pain. States his urine looked normal.   Past Medical History:  Diagnosis Date  . Allergic rhinitis due to pollen   . Diverticulosis of colon (without mention of hemorrhage)   . GERD (gastroesophageal reflux disease)   . History of colon polyps    2008--  PRE CANEROUS/   2013-- ADENOMA  . [name]   . Unspecified essential hypertension   . Wears glasses     Patient Active Problem List   Diagnosis Date Noted  . Prostate cancer ([place]) 11/01/2016  . Medicare annual wellness visit, initial 10/18/2015  . Hydrocele, right 06/24/2013  . Routine general medical examination at a health care facility 12/04/2010  . OBESITY, CLASS II 05/18/2010  . SCIATICA 05/18/2010  . POLYP, COLON 08/03/2007  . Essential hypertension 08/03/2007  . DIVERTICULOSIS, COLON 08/03/2007    Past Surgical History:  Procedure Laterality  Date  . COLONOSCOPY WITH PROPOFOL  10-30-2011  &  2008   POLYPECTOMY  . HYDROCELE EXCISION Right 07/18/2013   Procedure: RIGHT HYDROCELECTOMY ADULT;  Surgeon: Ardis Hughs, MD;  Location: [facility];  Service: Urology;  Laterality: Right;  . LYMPH NODE DISSECTION Bilateral 11/01/2016   Procedure: BILATERAL PELVIC LYMPH NODE DISSECTION;  Surgeon: Ardis Hughs, MD;  Location: [facility];  Service: Urology;  Laterality: Bilateral;  . ROBOT ASSISTED LAPAROSCOPIC RADICAL PROSTATECTOMY N/A 11/01/2016   Procedure: XI ROBOTIC ASSISTED LAPAROSCOPIC RADICAL PROSTATECTOMY;  Surgeon: Ardis Hughs, MD;  Location: [facility];  Service: Urology;  Laterality: N/A;       Home Medications    Prior to Admission medications   Medication Sig Start Date End Date Taking? Authorizing Provider  calcium carbonate (TUMS - DOSED IN MG ELEMENTAL CALCIUM) 500 MG chewable tablet Chew 1 tablet by mouth 3 (three) times daily as needed for indigestion or heartburn.     [provider]  hydrochlorothiazide (HYDRODIURIL) 25 MG tablet TAKE 1 TABLET (25 MG TOTAL) BY MOUTH DAILY. 09/20/16   Golden Circle, FNP  Multiple Vitamins-Minerals (MULTIVITAMIN PO) Take 1 tablet by mouth daily.     [provider]    Family History Family History  Problem Relation Age of Onset  . Coronary artery disease Father   . Heart attack Father        2nd one fatal  . Pulmonary embolism Father        ?  Marland Kitchen Dementia Father   .  Heart disease Father   . Dementia Mother   . Hypertension Mother   . Macular degeneration Mother   . Kidney disease Mother   . Diabetes Sister   . Prostate cancer Neg Hx   . Colon cancer Neg Hx   . Rectal cancer Neg Hx     Social History Social History  Substance Use Topics  . Smoking status: Former Smoker    Packs/day: 3.00    Years: 25.00    Types: Cigarettes    Quit date: 10/16/1996  . Smokeless tobacco: Never Used  . Alcohol use No     Allergies     Patient has no known allergies.   Review of Systems Review of Systems  Constitutional: Negative for chills and fever.  Respiratory: Negative for cough, chest tightness and shortness of breath.   Cardiovascular: Negative for chest pain, palpitations and leg swelling.  Gastrointestinal: Positive for constipation. Negative for abdominal distention, abdominal pain, diarrhea, nausea and vomiting.  Genitourinary: Positive for difficulty urinating. Negative for discharge, dysuria, frequency, hematuria, penile pain, scrotal swelling, testicular pain and urgency.  Musculoskeletal: Negative for arthralgias, myalgias, neck pain and neck stiffness.  Skin: Negative for rash.  Allergic/Immunologic: Negative for immunocompromised state.  Neurological: Negative for dizziness, weakness, light-headedness, numbness and headaches.  All other systems reviewed and are negative.    Physical Exam Updated Vital Signs BP (!) 170/80 (BP Location: Left Arm)   Pulse 90   Temp 98.7 F (37.1 C) (Oral)   Resp 14   Ht 5\' 6"  (1.676 m)   Wt 99.8 kg (220 lb)   SpO2 99%   BMI 35.51 kg/m   Physical Exam  Constitutional: He appears well-developed and well-nourished. No distress.  HENT:  Head: Normocephalic and atraumatic.  Eyes: Conjunctivae are normal.  Neck: Neck supple.  Cardiovascular: Normal rate, regular rhythm and normal heart sounds.   Pulmonary/Chest: Effort normal. No respiratory distress. He has no wheezes. He has no rales.  Abdominal: Soft. Bowel sounds are normal. He exhibits no distension. There is no tenderness. There is no rebound and no guarding.  Incisions healing well  Musculoskeletal: He exhibits no edema.  Neurological: He is alert.  Skin: Skin is warm and dry.  Nursing note and vitals reviewed.    ED Treatments / Results  Labs (all labs ordered are listed, but only abnormal results are displayed) Labs Reviewed  URINALYSIS, ROUTINE W REFLEX MICROSCOPIC - Abnormal; Notable for the  following:       Result Value   Color, Urine STRAW (*)    Hgb urine dipstick MODERATE (*)    All other components within normal limits    EKG  EKG Interpretation None       Radiology Dg Abd 2 Views  Result Date: 11/11/2016 CLINICAL DATA:  Prostatectomy 10 days ago. Complaining of abdominal distention, constipation and anuria 2 days. EXAM: ABDOMEN - 2 VIEW COMPARISON:  None. FINDINGS: Normal bowel gas pattern. No free air. No significant increase in stool. No evidence of renal or ureteral stones. Soft tissues are unremarkable. Lung bases are clear. No significant skeletal abnormality. IMPRESSION: Negative. Electronically Signed   By: Lajean Manes M.D.   On: 11/11/2016 12:25    Procedures Procedures (including critical care time)  Medications Ordered in ED Medications - No data to display   Initial Impression / Assessment and Plan / ED Course  I have reviewed the triage vital signs and the nursing notes.  Pertinent labs & imaging results that were available during  my care of the patient were reviewed by me and considered in my medical decision making (see chart for details).     Patient status post radical prostatectomy 10 days ago, had Foley removed 2 days ago. Patient is currently symptomatic but presented to the ED earlier for difficulty urinating. He urinated in the waiting room which relieved his symptoms. No fever. No abdominal pain. He feels like he may be constipated. He did take a laxative and had 4 small bowel movements overnight. We'll check abdominal film, urinalysis. Will monitor. Bladder scan after voiding in the waiting room showed no urine in the bladder.  1:21 PM Xray and UA negative other than moderate hgb. Spoke with Dr. Jeffie Pollock who recommended increase PO intake and follow up next week.  Discussed results and plan with pt who agrees. Return precautions discussed.   Vitals:   11/11/16 1043 11/11/16 1226 11/11/16 1400  BP: (!) 170/80 (!) 161/78 (!) 143/74    Pulse: 90 88 93  Resp: 14 16 16   Temp: 98.7 F (37.1 C)    TempSrc: Oral    SpO2: 99% 95% 99%  Weight: 99.8 kg (220 lb)    Height: 5\' 6"  (1.676 m)       Final Clinical Impressions(s) / ED Diagnoses   Final diagnoses:  Constipation  Difficulty urinating  Other microscopic hematuria    New Prescriptions Discharge Medication List as of 11/11/2016  1:34 PM       Jeannett Senior, PA-C 11/11/16 1522    Mesner, Corene Cornea, MD 11/12/16 1600

## 2016-11-11 NOTE — Discharge Instructions (Signed)
Increase fluid intake. Take miralax daily to help with constipation. Follow up with Dr. [name] this week in the office. Return if worsening symptoms.

## 2016-11-11 NOTE — ED Notes (Signed)
Pt aware of need for urine sample, given urinal

## 2016-11-14 DIAGNOSIS — R338 Other retention of urine: Secondary | ICD-10-CM | POA: Diagnosis not present

## 2016-11-14 DIAGNOSIS — R339 Retention of urine, unspecified: Secondary | ICD-10-CM | POA: Diagnosis not present

## 2016-11-29 DIAGNOSIS — M6208 Separation of muscle (nontraumatic), other site: Secondary | ICD-10-CM | POA: Diagnosis not present

## 2016-11-29 DIAGNOSIS — M6281 Muscle weakness (generalized): Secondary | ICD-10-CM | POA: Diagnosis not present

## 2016-11-29 DIAGNOSIS — N393 Stress incontinence (female) (male): Secondary | ICD-10-CM | POA: Diagnosis not present

## 2016-11-29 DIAGNOSIS — K5900 Constipation, unspecified: Secondary | ICD-10-CM | POA: Diagnosis not present

## 2016-12-07 DIAGNOSIS — Z8546 Personal history of malignant neoplasm of prostate: Secondary | ICD-10-CM | POA: Diagnosis not present

## 2016-12-14 DIAGNOSIS — Z8546 Personal history of malignant neoplasm of prostate: Secondary | ICD-10-CM | POA: Diagnosis not present

## 2016-12-15 DIAGNOSIS — M6208 Separation of muscle (nontraumatic), other site: Secondary | ICD-10-CM | POA: Diagnosis not present

## 2016-12-15 DIAGNOSIS — Z8546 Personal history of malignant neoplasm of prostate: Secondary | ICD-10-CM | POA: Diagnosis not present

## 2016-12-15 DIAGNOSIS — M6281 Muscle weakness (generalized): Secondary | ICD-10-CM | POA: Diagnosis not present

## 2016-12-15 DIAGNOSIS — N393 Stress incontinence (female) (male): Secondary | ICD-10-CM | POA: Diagnosis not present

## 2017-01-02 DIAGNOSIS — M6281 Muscle weakness (generalized): Secondary | ICD-10-CM | POA: Diagnosis not present

## 2017-01-02 DIAGNOSIS — M545 Low back pain: Secondary | ICD-10-CM | POA: Diagnosis not present

## 2017-01-02 DIAGNOSIS — N393 Stress incontinence (female) (male): Secondary | ICD-10-CM | POA: Diagnosis not present

## 2017-01-02 DIAGNOSIS — M62830 Muscle spasm of back: Secondary | ICD-10-CM | POA: Diagnosis not present

## 2017-01-17 ENCOUNTER — Encounter: Payer: Self-pay | Admitting: Family

## 2017-01-17 ENCOUNTER — Other Ambulatory Visit (INDEPENDENT_AMBULATORY_CARE_PROVIDER_SITE_OTHER): Payer: Medicare Other

## 2017-01-17 ENCOUNTER — Ambulatory Visit (INDEPENDENT_AMBULATORY_CARE_PROVIDER_SITE_OTHER): Payer: Medicare Other | Admitting: Family

## 2017-01-17 VITALS — BP 152/80 | HR 81 | Temp 98.2°F | Resp 16 | Ht 66.0 in | Wt 243.0 lb

## 2017-01-17 DIAGNOSIS — C61 Malignant neoplasm of prostate: Secondary | ICD-10-CM

## 2017-01-17 DIAGNOSIS — Z1211 Encounter for screening for malignant neoplasm of colon: Secondary | ICD-10-CM

## 2017-01-17 DIAGNOSIS — Z0000 Encounter for general adult medical examination without abnormal findings: Secondary | ICD-10-CM

## 2017-01-17 DIAGNOSIS — I10 Essential (primary) hypertension: Secondary | ICD-10-CM

## 2017-01-17 DIAGNOSIS — Z0001 Encounter for general adult medical examination with abnormal findings: Secondary | ICD-10-CM

## 2017-01-17 DIAGNOSIS — Z23 Encounter for immunization: Secondary | ICD-10-CM

## 2017-01-17 LAB — LIPID PANEL
Cholesterol: 153 mg/dL (ref 0–200)
HDL: 43.9 mg/dL (ref >39.00)
LDL Cholesterol: 78 mg/dL (ref 0–99)
NonHDL: 109.2
Total CHOL/HDL Ratio: 3
Triglycerides: 158 mg/dL — ABNORMAL HIGH (ref 0.0–149.0)
VLDL: 31.6 mg/dL (ref 0.0–40.0)

## 2017-01-17 LAB — COMPREHENSIVE METABOLIC PANEL
ALK PHOS: 72 U/L (ref 39–117)
ALT: 17 U/L (ref 0–53)
AST: 14 U/L (ref 0–37)
Albumin: 4.2 g/dL (ref 3.5–5.2)
BUN: 20 mg/dL (ref 6–23)
CHLORIDE: 100 meq/L (ref 96–112)
CO2: 29 meq/L (ref 19–32)
Calcium: 9.5 mg/dL (ref 8.4–10.5)
Creatinine, Ser: 1.1 mg/dL (ref 0.40–1.50)
GFR: 71.03 mL/min (ref >60.00)
GLUCOSE: 112 mg/dL — AB (ref 70–99)
POTASSIUM: 3.7 meq/L (ref 3.5–5.1)
SODIUM: 139 meq/L (ref 135–145)
TOTAL PROTEIN: 6.8 g/dL (ref 6.0–8.3)
Total Bilirubin: 0.3 mg/dL (ref 0.2–1.2)

## 2017-01-17 LAB — CBC
HEMATOCRIT: 44 % (ref 39.0–52.0)
HEMOGLOBIN: 15 g/dL (ref 13.0–17.0)
MCHC: 34.1 g/dL (ref 30.0–36.0)
MCV: 85.5 fl (ref 78.0–100.0)
PLATELETS: 287 10*3/uL (ref 150.0–400.0)
RBC: 5.15 Mil/uL (ref 4.22–5.81)
RDW: 14.9 % (ref 11.5–15.5)
WBC: 8.7 10*3/uL (ref 4.0–10.5)

## 2017-01-17 NOTE — Progress Notes (Signed)
Subjective:    Patient ID: Luis Knapp, male    DOB: 1950-07-11, 66 y.o.   MRN: [id number]  Chief Complaint  Patient presents with  . CPE    fasting    HPI:  Luis Knapp is a 66 y.o. male who presents today for a Medicare Annual Wellness/Physical exam.    1) Health Maintenance -   Diet - Averages about 3 meals per day consisting of regular diet; Caffeine on occasion.   Exercise - Walks about 30 minutes daily and yard work   2) Publishing rights manager / Immunizations:  Dental -- Up to date  Vision -- Due for exam   Health Maintenance  Topic Date Due  . COLONOSCOPY  10/29/2016  . INFLUENZA VACCINE  12/13/2016  . PNA vac Low Risk Adult (2 of 2 - PPSV23) 03/20/2017  . TETANUS/TDAP  09/19/2017  . Hepatitis C Screening  Completed     Immunization History  Administered Date(s) Administered  . Influenza, High Dose Seasonal PF 01/17/2017  . Pneumococcal Conjugate-13 06/23/2013  . Pneumococcal Polysaccharide-23 03/20/2012  . Td 09/20/2007    RISK FACTORS  Tobacco History  Smoking Status  . Former Smoker  . Packs/day: 3.00  . Years: 25.00  . Types: Cigarettes  . Quit date: 10/16/1996  Smokeless Tobacco  . Never Used     Cardiac risk factors: advanced age (older than 16 for men, 12 for women), hypertension, male gender and obesity (BMI >= 30 kg/m2).  Depression Screen  Depression screen [facility] 2/9 01/17/2017  Decreased Interest 0  Down, Depressed, Hopeless 0  PHQ - 2 Score 0     Activities of Daily Living In your present state of health, do you have any difficulty performing the following activities?:  Driving? No Managing money?  No Feeding yourself? No Getting from bed to chair? No Climbing a flight of stairs? No Preparing food and eating?: No Bathing or showering? No Getting dressed: No Getting to the toilet? No Using the toilet: No Moving around from place to place: No In the past year have you fallen or had a near fall?:No   Home  Safety Has smoke detector and wears seat belts.  No excess sun exposure. Are there smokers in your home (other than you)?  No Do you feel safe at home?  Yes  Hearing Difficulties: No Do you often ask people to speak up or repeat themselves? No Do you experience ringing or noises in your ears? No  Do you have difficulty understanding soft or whispered voices? No    Cognitive Testing  Alert? Yes   Normal Appearance? Yes  Oriented to person? Yes  Place? Yes   Time? Yes  Recall of three objects?  Yes  Can perform simple calculations? Yes  Displays appropriate judgment? Yes  Can read the correct time from a watch face? Yes  Do you feel that you have a problem with memory? No  Do you often misplace items? No   Advanced Directives have been discussed with the patient? Yes   Current Physicians/Providers and Suppliers  1. Terri Piedra, FNP - Internal Medicine 2. [name], MD - Urology  Indicate any recent Medical Services you may have received from other than [facility] providers in the past year (date may be approximate).  All answers were reviewed with the patient and necessary referrals were made:  Luis Knapp, [place]   01/17/2017    No Known Allergies   Outpatient Medications Prior to Visit  Medication Sig Dispense Refill  .  calcium carbonate (TUMS - DOSED IN MG ELEMENTAL CALCIUM) 500 MG chewable tablet Chew 1 tablet by mouth 3 (three) times daily as needed for indigestion or heartburn.     . hydrochlorothiazide (HYDRODIURIL) 25 MG tablet TAKE 1 TABLET (25 MG TOTAL) BY MOUTH DAILY. 30 tablet 0  . ibuprofen (ADVIL,MOTRIN) 200 MG tablet Take 400 mg by mouth every 6 (six) hours as needed.     No facility-administered medications prior to visit.      Past Medical History:  Diagnosis Date  . Allergic rhinitis due to pollen   . Cancer ([place])   . Diverticulosis of colon (without mention of hemorrhage)   . GERD (gastroesophageal reflux disease)   . History of colon polyps     2008--  PRE CANEROUS/   2013-- ADENOMA  . [name]   . Unspecified essential hypertension   . Wears glasses      Past Surgical History:  Procedure Laterality Date  . COLONOSCOPY WITH PROPOFOL  10-30-2011  &  2008   POLYPECTOMY  . HYDROCELE EXCISION Right 07/18/2013   Procedure: RIGHT HYDROCELECTOMY ADULT;  Surgeon: Ardis Hughs, MD;  Location: [facility];  Service: Urology;  Laterality: Right;  . LYMPH NODE DISSECTION Bilateral 11/01/2016   Procedure: BILATERAL PELVIC LYMPH NODE DISSECTION;  Surgeon: Ardis Hughs, MD;  Location: [facility];  Service: Urology;  Laterality: Bilateral;  . ROBOT ASSISTED LAPAROSCOPIC RADICAL PROSTATECTOMY N/A 11/01/2016   Procedure: XI ROBOTIC ASSISTED LAPAROSCOPIC RADICAL PROSTATECTOMY;  Surgeon: Ardis Hughs, MD;  Location: [facility];  Service: Urology;  Laterality: N/A;     Family History  Problem Relation Age of Onset  . Coronary artery disease Father   . Heart attack Father        2nd one fatal  . Pulmonary embolism Father        ?  Marland Kitchen Dementia Father   . Heart disease Father   . Dementia Mother   . Hypertension Mother   . Macular degeneration Mother   . Kidney disease Mother   . Diabetes Sister   . Prostate cancer Neg Hx   . Colon cancer Neg Hx   . Rectal cancer Neg Hx      Social History   Social History  . Marital status: Married    Spouse name: N/A  . Number of children: N/A  . Years of education: N/A   Occupational History  . Retried - Management consultant    [employer], trained as  . Doctor, general practice   Social History Main Topics  . Smoking status: Former Smoker    Packs/day: 3.00    Years: 25.00    Types: Cigarettes    Quit date: 10/16/1996  . Smokeless tobacco: Never Used  . Alcohol use No  . Drug use: No  . Sexual activity: Not on file   Other Topics Concern  . Not on file   Social History Narrative   Married '77- 19 years divorced; married  '02. 2 sons - '79, '80; 1 grandson. Things are ok. Work is OK - Dance movement psychotherapist.    Fun/Hobby: Works at [employer] on a regular basis     Review of Systems  Constitutional: Denies fever, chills, fatigue, or significant weight gain/loss. HENT: Head: Denies headache or neck pain Ears: Denies changes in hearing, ringing in ears, earache, drainage Nose: Denies discharge, stuffiness, itching, nosebleed, sinus pain Throat: Denies sore throat, hoarseness, dry mouth, sores,  thrush Eyes: Denies loss/changes in vision, pain, redness, blurry/double vision, flashing lights Cardiovascular: Denies chest pain/discomfort, tightness, palpitations, shortness of breath with activity, difficulty lying down, swelling, sudden awakening with shortness of breath Respiratory: Denies shortness of breath, cough, sputum production, wheezing Gastrointestinal: Denies dysphasia, heartburn, change in appetite, nausea, change in bowel habits, rectal bleeding, constipation, diarrhea, yellow skin or eyes Genitourinary: Denies frequency, urgency, burning/pain, blood in urine, incontinence, change in urinary strength. Musculoskeletal: Denies muscle/joint pain, stiffness, back pain, redness or swelling of joints, trauma Skin: Denies rashes, lumps, itching, dryness, color changes, or hair/nail changes Neurological: Denies dizziness, fainting, seizures, weakness, numbness, tingling, tremor Psychiatric - Denies nervousness, stress, depression or memory loss Endocrine: Denies heat or cold intolerance, sweating, frequent urination, excessive thirst, changes in appetite Hematologic: Denies ease of bruising or bleeding    Objective:     BP (!) 152/80 (BP Location: Left Arm, Patient Position: Sitting, Cuff Size: Large)   Pulse 81   Temp 98.2 F (36.8 C) (Oral)   Resp 16   Ht 5\' 6"  (1.676 m)   Wt 243 lb (110.2 kg)   SpO2 96%   BMI 39.22 kg/m  Nursing note and vital signs reviewed.  Physical Exam  Constitutional: He is  oriented to person, place, and time. He appears well-developed and well-nourished. No distress.  Cardiovascular: Normal rate, regular rhythm, normal heart sounds and intact distal pulses.   Pulmonary/Chest: Effort normal and breath sounds normal.  Neurological: He is alert and oriented to person, place, and time.  Skin: Skin is warm and dry.  Psychiatric: He has a normal mood and affect. His behavior is normal. Judgment and thought content normal.       Assessment & Plan:   During the course of the visit the patient was educated and counseled about appropriate screening and preventive services including:    Pneumococcal vaccine   Influenza vaccine  Td vaccine  Prostate cancer screening  Colorectal cancer screening  Nutrition counseling   Diet review for nutrition referral? Yes ____  Not Indicated _X___   Patient Instructions (the written plan) was given to the patient.  Medicare Attestation I have personally reviewed: The patient's medical and social history Their use of alcohol, tobacco or illicit drugs Their current medications and supplements The patient's functional ability including ADLs,fall risks, home safety risks, cognitive, and hearing and visual impairment Diet and physical activities Evidence for depression or mood disorders  The patient's weight, height, BMI,  have been recorded in the chart.  I have made referrals, counseling, and provided education to the patient based on review of the above and I have provided the patient with a written personalized care plan for preventive services.     Problem List Items Addressed This Visit      Cardiovascular and Mediastinum   Essential hypertension    Blood pressure slightly elevated today above goal of 150/90. Continue current dosage of hydrochlorothiazide. Monitor blood pressure at home and follow a low sodium diet. If average blood pressure remains elevated consider additional medication.       Relevant  Medications   sildenafil (REVATIO) 20 MG tablet   Other Relevant Orders   Comprehensive metabolic panel   CBC   Lipid panel     Genitourinary   Prostate cancer ([place])    Stable with no current symptoms following recent surgery. Continued follow up and management per Urology.         Other   Medicare annual wellness visit, initial - Primary  Reviewed and updated patient's medical, surgical, family and social history. Medications and allergies were also reviewed. Basic screenings for depression, activities of daily living, hearing, cognition and safety were performed. Provider list was updated and health plan was provided to the patient.   Influenza updated today. All other immunizations are up to date per recommendations. Due for a vision exam encouraged to be completed independently. Due for colon cancer screening with referral to gastroenterology placed. Prostate cancer being followed by Urology including PSA.   All other screenings are up to date per recommendations.   Overall well exam with risk factors for cardiovascular disease including obesity and hypertension. Blood pressure slightly elevated today. Recommend weight loss of 5-10% of current body weight through physical activity and nutritional changes. Continue other healthy lifestyle behaviors and choices. Follow up prevention exam in 1 year. Follow up office visit pending blood work.        Other Visit Diagnoses    Colon cancer screening       Relevant Orders   Ambulatory referral to Gastroenterology   Need for influenza vaccination       Relevant Orders   Flu vaccine HIGH DOSE PF (Fluzone High dose) (Completed)       I am having Mr. Huster maintain his calcium carbonate, hydrochlorothiazide, ibuprofen, and sildenafil.   Meds ordered this encounter  Medications  . sildenafil (REVATIO) 20 MG tablet    Sig: Take 20 mg by mouth 3 (three) times daily.     Follow-up: Return in about 6 months (around 07/17/2017), or  if symptoms worsen or fail to improve.   Luis Po, FNP

## 2017-01-17 NOTE — Patient Instructions (Addendum)
Thank you for choosing [facility].  SUMMARY AND INSTRUCTIONS:  Please continue to take your medications as prescribed.   Continue to monitor your blood pressure at home and work on decreasing sodium in your diet. If your blood pressure remains elevated above goal of 150/90 please let us know.  We will check your blood work today.  Labs:  Please stop by the lab on the lower level of the building for your blood work. Your results will be released to [place] (or called to you) after review, usually within 72 hours after test completion. If any changes need to be made, you will be notified at that same time.  1.) The lab is open from 7:30am to 5:30 pm Monday-Friday 2.) No appointment is necessary 3.) Fasting (if needed) is 6-8 hours after food and drink; black coffee and water are okay    Follow up:  If your symptoms worsen or fail to improve, please contact our office for further instruction, or in case of emergency go directly to the emergency room at the closest medical facility.   Health Maintenance  Topic Date Due  . COLONOSCOPY  10/29/2016  . INFLUENZA VACCINE  12/13/2016  . PNA vac Low Risk Adult (2 of 2 - PPSV23) 03/20/2017  . TETANUS/TDAP  09/19/2017  . Hepatitis C Screening  Completed     Health Maintenance, Male A healthy lifestyle and preventive care is important for your health and wellness. Ask your health care provider about what schedule of regular examinations is right for you. What should I know about weight and diet? Eat a Healthy Diet  Eat plenty of vegetables, fruits, whole grains, low-fat dairy products, and lean protein.  Do not eat a lot of foods high in solid fats, added sugars, or salt.  Maintain a Healthy Weight Regular exercise can help you achieve or maintain a healthy weight. You should:  Do at least 150 minutes of exercise each week. The exercise should increase your heart rate and make you sweat (moderate-intensity exercise).  Do  strength-training exercises at least twice a week.  Watch Your Levels of Cholesterol and Blood Lipids  Have your blood tested for lipids and cholesterol every 5 years starting at 67 years of age. If you are at high risk for heart disease, you should start having your blood tested when you are 67 years old. You may need to have your cholesterol levels checked more often if: ? Your lipid or cholesterol levels are high. ? You are older than 67 years of age. ? You are at high risk for heart disease.  What should I know about cancer screening? Many types of cancers can be detected early and may often be prevented. Lung Cancer  You should be screened every year for lung cancer if: ? You are a current smoker who has smoked for at least 30 years. ? You are a former smoker who has quit within the past 15 years.  Talk to your health care provider about your screening options, when you should start screening, and how often you should be screened.  Colorectal Cancer  Routine colorectal cancer screening usually begins at 67 years of age and should be repeated every 5-10 years until you are 67 years old. You may need to be screened more often if early forms of precancerous polyps or small growths are found. Your health care provider may recommend screening at an earlier age if you have risk factors for colon cancer.  Your health care provider may  recommend using home test kits to check for hidden blood in the stool.  A small camera at the end of a tube can be used to examine your colon (sigmoidoscopy or colonoscopy). This checks for the earliest forms of colorectal cancer.  Prostate and Testicular Cancer  Depending on your age and overall health, your health care provider may do certain tests to screen for prostate and testicular cancer.  Talk to your health care provider about any symptoms or concerns you have about testicular or prostate cancer.  Skin Cancer  Check your skin from head to toe  regularly.  Tell your health care provider about any new moles or changes in moles, especially if: ? There is a change in a mole's size, shape, or color. ? You have a mole that is larger than a pencil eraser.  Always use sunscreen. Apply sunscreen liberally and repeat throughout the day.  Protect yourself by wearing long sleeves, pants, a wide-brimmed hat, and sunglasses when outside.  What should I know about heart disease, diabetes, and high blood pressure?  If you are 73-33 years of age, have your blood pressure checked every 3-5 years. If you are 53 years of age or older, have your blood pressure checked every year. You should have your blood pressure measured twice-once when you are at a hospital or clinic, and once when you are not at a hospital or clinic. Record the average of the two measurements. To check your blood pressure when you are not at a hospital or clinic, you can use: ? An automated blood pressure machine at a pharmacy. ? A home blood pressure monitor.  Talk to your health care provider about your target blood pressure.  If you are between 3-52 years old, ask your health care provider if you should take aspirin to prevent heart disease.  Have regular diabetes screenings by checking your fasting blood sugar level. ? If you are at a normal weight and have a low risk for diabetes, have this test once every three years after the age of 12. ? If you are overweight and have a high risk for diabetes, consider being tested at a younger age or more often.  A one-time screening for abdominal aortic aneurysm (AAA) by ultrasound is recommended for men aged 50-75 years who are current or former smokers. What should I know about preventing infection? Hepatitis B If you have a higher risk for hepatitis B, you should be screened for this virus. Talk with your health care provider to find out if you are at risk for hepatitis B infection. Hepatitis C Blood testing is recommended  for:  Everyone born from [birth year] through 1966.  Anyone with known risk factors for hepatitis C.  Sexually Transmitted Diseases (STDs)  You should be screened each year for STDs including gonorrhea and chlamydia if: ? You are sexually active and are younger than 67 years of age. ? You are older than 67 years of age and your health care provider tells you that you are at risk for this type of infection. ? Your sexual activity has changed since you were last screened and you are at an increased risk for chlamydia or gonorrhea. Ask your health care provider if you are at risk.  Talk with your health care provider about whether you are at high risk of being infected with HIV. Your health care provider may recommend a prescription medicine to help prevent HIV infection.  What else can I do?  Schedule regular health, dental,  and eye exams.  Stay current with your vaccines (immunizations).  Do not use any tobacco products, such as cigarettes, chewing tobacco, and e-cigarettes. If you need help quitting, ask your health care provider.  Limit alcohol intake to no more than 2 drinks per day. One drink equals 12 ounces of beer, 5 ounces of wine, or 1 ounces of hard liquor.  Do not use street drugs.  Do not share needles.  Ask your health care provider for help if you need support or information about quitting drugs.  Tell your health care provider if you often feel depressed.  Tell your health care provider if you have ever been abused or do not feel safe at home. This information is not intended to replace advice given to you by your health care provider. Make sure you discuss any questions you have with your health care provider. Document Released: 10/28/2007 Document Revised: 12/29/2015 Document Reviewed: 02/02/2015 Elsevier Interactive Patient Education  [employer].

## 2017-01-17 NOTE — Assessment & Plan Note (Signed)
Blood pressure slightly elevated today above goal of 150/90. Continue current dosage of hydrochlorothiazide. Monitor blood pressure at home and follow a low sodium diet. If average blood pressure remains elevated consider additional medication.

## 2017-01-17 NOTE — Assessment & Plan Note (Signed)
Reviewed and updated patient's medical, surgical, family and social history. Medications and allergies were also reviewed. Basic screenings for depression, activities of daily living, hearing, cognition and safety were performed. Provider list was updated and health plan was provided to the patient.   Influenza updated today. All other immunizations are up to date per recommendations. Due for a vision exam encouraged to be completed independently. Due for colon cancer screening with referral to gastroenterology placed. Prostate cancer being followed by Urology including PSA.   All other screenings are up to date per recommendations.   Overall well exam with risk factors for cardiovascular disease including obesity and hypertension. Blood pressure slightly elevated today. Recommend weight loss of 5-10% of current body weight through physical activity and nutritional changes. Continue other healthy lifestyle behaviors and choices. Follow up prevention exam in 1 year. Follow up office visit pending blood work.

## 2017-01-17 NOTE — Assessment & Plan Note (Signed)
Stable with no current symptoms following recent surgery. Continued follow up and management per Urology.

## 2017-01-24 DIAGNOSIS — N393 Stress incontinence (female) (male): Secondary | ICD-10-CM | POA: Diagnosis not present

## 2017-01-24 DIAGNOSIS — M6281 Muscle weakness (generalized): Secondary | ICD-10-CM | POA: Diagnosis not present

## 2017-01-24 DIAGNOSIS — M62838 Other muscle spasm: Secondary | ICD-10-CM | POA: Diagnosis not present

## 2017-01-24 DIAGNOSIS — M6208 Separation of muscle (nontraumatic), other site: Secondary | ICD-10-CM | POA: Diagnosis not present

## 2017-01-25 ENCOUNTER — Encounter: Payer: Self-pay | Admitting: Gastroenterology

## 2017-01-27 ENCOUNTER — Other Ambulatory Visit: Payer: Self-pay | Admitting: Family

## 2017-03-13 ENCOUNTER — Ambulatory Visit (AMBULATORY_SURGERY_CENTER): Payer: Self-pay | Admitting: *Deleted

## 2017-03-13 VITALS — Ht 66.0 in | Wt 251.4 lb

## 2017-03-13 DIAGNOSIS — Z86010 Personal history of colonic polyps: Secondary | ICD-10-CM

## 2017-03-13 DIAGNOSIS — Z800 Family history of malignant neoplasm of digestive organs: Secondary | ICD-10-CM

## 2017-03-13 MED ORDER — NA SULFATE-K SULFATE-MG SULF 17.5-3.13-1.6 GM/177ML PO SOLN: 1.0000 [IU] | Freq: Once | ORAL | 0 refills | Status: AC

## 2017-03-13 NOTE — Progress Notes (Signed)
No egg or soy allergy known to patient  No issues with past sedation with any surgeries  or procedures, no intubation problems  No diet pills per patient No home 02 use per patient  No blood thinners per patient  Pt denies issues with constipation not currently No A fib or A flutter  EMMI video sent to pt's e mail pt. declined

## 2017-03-27 ENCOUNTER — Ambulatory Visit (AMBULATORY_SURGERY_CENTER): Payer: Medicare Other | Admitting: Gastroenterology

## 2017-03-27 ENCOUNTER — Other Ambulatory Visit: Payer: Self-pay

## 2017-03-27 ENCOUNTER — Encounter: Payer: Self-pay | Admitting: Gastroenterology

## 2017-03-27 VITALS — BP 127/72 | HR 78 | Temp 98.0°F | Resp 18 | Ht 66.0 in | Wt 243.0 lb

## 2017-03-27 DIAGNOSIS — D122 Benign neoplasm of ascending colon: Secondary | ICD-10-CM

## 2017-03-27 DIAGNOSIS — D125 Benign neoplasm of sigmoid colon: Secondary | ICD-10-CM

## 2017-03-27 DIAGNOSIS — K635 Polyp of colon: Secondary | ICD-10-CM

## 2017-03-27 DIAGNOSIS — Z86010 Personal history of colonic polyps: Secondary | ICD-10-CM | POA: Diagnosis not present

## 2017-03-27 DIAGNOSIS — I10 Essential (primary) hypertension: Secondary | ICD-10-CM | POA: Diagnosis not present

## 2017-03-27 MED ORDER — SODIUM CHLORIDE 0.9 % IV SOLN: 500.0000 mL | INTRAVENOUS | Status: DC

## 2017-03-27 NOTE — Progress Notes (Signed)
Called to room to assist during endoscopic procedure.  Patient ID and intended procedure confirmed with present staff. Received instructions for my participation in the procedure from the performing physician.  

## 2017-03-27 NOTE — Op Note (Signed)
[facility] Patient Name: Luis Knapp Procedure Date: 03/27/2017 9:25 AM MRN: [id number] Endoscopist: Remo Lipps P. Armbruster MD, MD Age: 66 Referring MD:  Date of Birth: Feb 09, 1951 Gender: Male Account #: [id number] Procedure:                Colonoscopy Indications:              Surveillance: Personal history of adenomatous                            polyps on last colonoscopy 5 years ago Medicines:                Monitored Anesthesia Care Procedure:                Pre-Anesthesia Assessment:                           - Prior to the procedure, a History and Physical                            was performed, and patient medications and                            allergies were reviewed. The patient's tolerance of                            previous anesthesia was also reviewed. The risks                            and benefits of the procedure and the sedation                            options and risks were discussed with the patient.                            All questions were answered, and informed consent                            was obtained. Prior Anticoagulants: The patient has                            taken no previous anticoagulant or antiplatelet                            agents. ASA Grade Assessment: II - A patient with                            mild systemic disease. After reviewing the risks                            and benefits, the patient was deemed in                            satisfactory condition to undergo the procedure.  After obtaining informed consent, the colonoscope                            was passed under direct vision. Throughout the                            procedure, the patient's blood pressure, pulse, and                            oxygen saturations were monitored continuously. The                            Model CF-HQ190L [id number]) scope was introduced                            through the  anus and advanced to the the cecum,                            identified by appendiceal orifice and ileocecal                            valve. The colonoscopy was performed without                            difficulty. The patient tolerated the procedure                            well. The quality of the bowel preparation was                            good. The ileocecal valve, appendiceal orifice, and                            rectum were photographed. Scope In: 9:33:10 AM Scope Out: 9:50:08 AM Scope Withdrawal Time: 0 hours 15 minutes 43 seconds  Total Procedure Duration: 0 hours 16 minutes 58 seconds  Findings:                 The perianal and digital rectal examinations were                            normal.                           A roughly 64mm polyp was found in the ascending                            colon. The polyp was sessile. The polyp was removed                            with a cold snare. Resection and retrieval were                            complete.  A 4 mm polyp was found in the sigmoid colon. The                            polyp was sessile. The polyp was removed with a                            cold snare. Resection and retrieval were complete.                           Multiple medium-mouthed diverticula were found in                            the entire colon.                           Internal hemorrhoids were found during retroflexion.                           The exam was otherwise without abnormality. Complications:            No immediate complications. Estimated blood loss:                            Minimal. Estimated Blood Loss:     Estimated blood loss was minimal. Impression:               - One roughly 15 mm polyp in the ascending colon,                            removed with a cold snare. Resected and retrieved.                           - One 4 mm polyp in the sigmoid colon, removed with                             a cold snare. Resected and retrieved.                           - Diverticulosis in the entire examined colon.                           - Internal hemorrhoids.                           - The examination was otherwise normal. Recommendation:           - Patient has a contact number available for                            emergencies. The signs and symptoms of potential                            delayed complications were discussed with the  patient. Return to normal activities tomorrow.                            Written discharge instructions were provided to the                            patient.                           - Resume previous diet.                           - Continue present medications.                           - Await pathology results.                           - Repeat colonoscopy is recommended for                            surveillance. The colonoscopy date will be                            determined after pathology results from today's                            exam become available for review.                           - No ibuprofen, naproxen, or other non-steroidal                            anti-inflammatory drugs for 2 weeks after polyp                            removal. Remo Lipps P. Armbruster MD, MD 03/27/2017 9:54:09 AM This report has been signed electronically.

## 2017-03-27 NOTE — Patient Instructions (Signed)
Please see handouts on polyps and Diverticulosis  YOU HAD AN ENDOSCOPIC PROCEDURE TODAY AT [facility]:   Refer to the procedure report that was given to you for any specific questions about what was found during the examination.  If the procedure report does not answer your questions, please call your gastroenterologist to clarify.  If you requested that your care partner not be given the details of your procedure findings, then the procedure report has been included in a sealed envelope for you to review at your convenience later.  YOU SHOULD EXPECT: Some feelings of bloating in the abdomen. Passage of more gas than usual.  Walking can help get rid of the air that was put into your GI tract during the procedure and reduce the bloating. If you had a lower endoscopy (such as a colonoscopy or flexible sigmoidoscopy) you may notice spotting of blood in your stool or on the toilet paper. If you underwent a bowel prep for your procedure, you may not have a normal bowel movement for a few days.  Please Note:  You might notice some irritation and congestion in your nose or some drainage.  This is from the oxygen used during your procedure.  There is no need for concern and it should clear up in a day or so.  SYMPTOMS TO REPORT IMMEDIATELY:   Following lower endoscopy (colonoscopy or flexible sigmoidoscopy):  Excessive amounts of blood in the stool  Significant tenderness or worsening of abdominal pains  Swelling of the abdomen that is new, acute  Fever of 100F or higher   For urgent or emergent issues, a gastroenterologist can be reached at any hour by calling [phone].  No Ibuprofen, motrin, Advil or aspirin products for 2 weeks.   DIET:  We do recommend a small meal at first, but then you may proceed to your regular diet.  Drink plenty of fluids but you should avoid alcoholic beverages for 24 hours.  ACTIVITY:  You should plan to take it easy for the rest of today and you  should NOT DRIVE or use heavy machinery until tomorrow (because of the sedation medicines used during the test).    FOLLOW UP: Our staff will call the number listed on your records the next business day following your procedure to check on you and address any questions or concerns that you may have regarding the information given to you following your procedure. If we do not reach you, we will leave a message.  However, if you are feeling well and you are not experiencing any problems, there is no need to return our call.  We will assume that you have returned to your regular daily activities without incident.  If any biopsies were taken you will be contacted by phone or by letter within the next 1-3 weeks.  Please call us at [phone] if you have not heard about the biopsies in 3 weeks.    SIGNATURES/CONFIDENTIALITY: You and/or your care partner have signed paperwork which will be entered into your electronic medical record.  These signatures attest to the fact that that the information above on your After Visit Summary has been reviewed and is understood.  Full responsibility of the confidentiality of this discharge information lies with you and/or your care-partner.  Thank you for letting us take care of your healthcare needs today.

## 2017-03-27 NOTE — Progress Notes (Signed)
Pt's states no medical or surgical changes since previsit or office visit. 

## 2017-03-27 NOTE — Progress Notes (Signed)
Pt awake and alert. spont resp. Report to RN

## 2017-03-28 ENCOUNTER — Telehealth: Payer: Self-pay | Admitting: *Deleted

## 2017-03-28 NOTE — Telephone Encounter (Signed)
  Follow up Call-  Call back number 03/27/2017  Post procedure Call Back phone  # [phone]  Permission to leave phone message Yes  Some recent data might be hidden     Patient questions:  Do you have a fever, pain , or abdominal swelling? No. Pain Score  0 *  Have you tolerated food without any problems? Yes.    Have you been able to return to your normal activities? Yes.    Do you have any questions about your discharge instructions: Diet   No. Medications  No. Follow up visit  No.  Do you have questions or concerns about your Care? No.  Actions: * If pain score is 4 or above: No action needed, pain <4.

## 2017-03-29 ENCOUNTER — Encounter: Payer: Self-pay | Admitting: Gastroenterology

## 2017-04-17 ENCOUNTER — Encounter: Payer: Self-pay | Admitting: Family Medicine

## 2017-04-17 ENCOUNTER — Ambulatory Visit (INDEPENDENT_AMBULATORY_CARE_PROVIDER_SITE_OTHER): Payer: Medicare Other | Admitting: Family Medicine

## 2017-04-17 VITALS — BP 148/86 | HR 85 | Temp 97.6°F | Ht 66.0 in | Wt 253.0 lb

## 2017-04-17 DIAGNOSIS — R21 Rash and other nonspecific skin eruption: Secondary | ICD-10-CM | POA: Diagnosis not present

## 2017-04-17 MED ORDER — TRIAMCINOLONE ACETONIDE 0.1 % EX CREA: 1.0000 "application " | TOPICAL_CREAM | Freq: Two times a day (BID) | CUTANEOUS | 0 refills | Status: DC

## 2017-04-17 NOTE — Progress Notes (Signed)
Luis Knapp - 66 y.o. male MRN [id number]  Date of birth: 11-Feb-1951  SUBJECTIVE:  Including CC & ROS.  Chief Complaint  Patient presents with  . Blisters    He states he noticed blisters and a rash on his right upper arm and extends to his axilla last night. Denies fevers.     Luis Knapp is a 66 y.o. male that is presenting with a rash. His rash started yesterday. There is no significant pain. The rash is close to his left axilla and is mildly itchy in nature. They are occurring on the lateral aspect of his upper arm, the anterior aspect of his left shoulder and another spot close to the sternoclavicular joint on the right side. He has done some yard work outside recently. Has not received the shingles vaccine. They did apply hydrocortisone ointment to all areas with some improvement.   Review of Systems  Constitutional: Negative for fever.  Cardiovascular: Negative for chest pain.  Skin: Positive for rash.  Neurological: Negative for weakness.  Hematological: Negative for adenopathy.    HISTORY: Past Medical, Surgical, Social, and Family History Reviewed & Updated per EMR.   Pertinent Historical Findings include:  Past Medical History:  Diagnosis Date  . Allergic rhinitis due to pollen   . Allergy   . Cancer ([place]) 11/01/2016   prostate   . Diverticulosis of colon (without mention of hemorrhage)   . GERD (gastroesophageal reflux disease)   . History of colon polyps    2008--  PRE CANEROUS/   2013-- ADENOMA  . [name]   . Unspecified essential hypertension   . Wears glasses     Past Surgical History:  Procedure Laterality Date  . COLONOSCOPY    . COLONOSCOPY WITH PROPOFOL  10-30-2011  &  2008   POLYPECTOMY  . HYDROCELE EXCISION Right 07/18/2013   Procedure: RIGHT HYDROCELECTOMY ADULT;  Surgeon: Ardis Hughs, MD;  [facility];  Service: Urology;  Laterality: Right;  . LYMPH NODE DISSECTION Bilateral 11/01/2016   Procedure:  BILATERAL PELVIC LYMPH NODE DISSECTION;  Surgeon: Ardis Hughs, MD;  Location: [facility];  Service: Urology;  Laterality: Bilateral;  . POLYPECTOMY    . ROBOT ASSISTED LAPAROSCOPIC RADICAL PROSTATECTOMY N/A 11/01/2016   Procedure: XI ROBOTIC ASSISTED LAPAROSCOPIC RADICAL PROSTATECTOMY;  Surgeon: Ardis Hughs, MD;  Location: [facility];  Service: Urology;  Laterality: N/A;    No Known Allergies  Family History  Problem Relation Age of Onset  . Coronary artery disease Father   . Heart attack Father        2nd one fatal  . Pulmonary embolism Father        ?  Marland Kitchen Dementia Father   . Heart disease Father   . Dementia Mother   . Hypertension Mother   . Macular degeneration Mother   . Kidney disease Mother   . Diabetes Sister   . Colon cancer Maternal Grandfather   . Prostate cancer Neg Hx   . Rectal cancer Neg Hx   . Colon polyps Neg Hx   . Esophageal cancer Neg Hx      Social History   Socioeconomic History  . Marital status: Married    Spouse name: Not on file  . Number of children: Not on file  . Years of education: Not on file  . Highest education level: Not on file  Social Needs  . Financial resource strain: Not on file  . Food insecurity - worry: Not on  file  . Food insecurity - inability: Not on file  . Transportation needs - medical: Not on file  . Transportation needs - non-medical: Not on file  Occupational History  . Occupation: Retried - Chemical engineer: [employer]    Comment: [employer], trained as  . 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 Smoking status: Former Smoker    Packs/day: 3.00    Years: 25.00    Pack years: 75.00    Types: Cigarettes    Last attempt to quit: 10/16/1996    Years since quitting: 20.5  . Smokeless tobacco: Never Used  Substance and Sexual Activity  . Alcohol use: No  . Drug use: No  . Sexual activity: Not on file  Other Topics Concern  . Not on file  Social  History Narrative   Married '77- 19 years divorced; married '02. 2 sons - '79, '80; 1 grandson. Things are ok. Work is OK - Dance movement psychotherapist.    Fun/Hobby: Works at [employer] on a regular basis     PHYSICAL EXAM:  VS: BP (!) 148/86 (BP Location: Left Arm, Patient Position: Sitting, Cuff Size: Normal)   Pulse 85   Temp 97.6 F (36.4 C) (Oral)   Ht 5\' 6"  (1.676 m)   Wt 253 lb (114.8 kg)   SpO2 98%   BMI 40.84 kg/m  Physical Exam Gen: NAD, alert, cooperative with exam, well-appearing ENT: normal lips, normal nasal mucosa,  Eye: normal EOM, normal conjunctiva and lids CV:  no edema, +2 pedal pulses   Resp: no accessory muscle use, non-labored,  Skin: The rashes do not follow any specific dermatome, they do have some vascular formation with some redness around them, Neuro: normal tone, normal sensation to touch Psych:  normal insight, alert and oriented MSK: Normal gait, normal strength      ASSESSMENT & PLAN:   Rash Possible for shingles but no specific dermatome. Possible for contact dermatitis with recently working in the yard. - Try triamcinolone dermatome areas. There is no improvement to consider sending an antiviral - Counseled to receive that she was vaccine in 6 months if this does appear to be shingles in nature.

## 2017-04-17 NOTE — Assessment & Plan Note (Signed)
Possible for shingles but no specific dermatome. Possible for contact dermatitis with recently working in the yard. - Try triamcinolone dermatome areas. There is no improvement to consider sending an antiviral - Counseled to receive that she was vaccine in 6 months if this does appear to be shingles in nature.

## 2017-04-17 NOTE — Patient Instructions (Signed)
Thank you for coming in,   Please try the topical steroid.   Please give me a call back if there is no improvement and we could try an anti-viral.    Please feel free to call with any questions or concerns at any time, at [phone]. --Dr. Raeford Razor

## 2017-06-12 DIAGNOSIS — Z8546 Personal history of malignant neoplasm of prostate: Secondary | ICD-10-CM | POA: Diagnosis not present

## 2017-06-12 DIAGNOSIS — N393 Stress incontinence (female) (male): Secondary | ICD-10-CM | POA: Diagnosis not present

## 2017-06-12 DIAGNOSIS — N5231 Erectile dysfunction following radical prostatectomy: Secondary | ICD-10-CM | POA: Diagnosis not present

## 2017-08-23 DIAGNOSIS — Z8546 Personal history of malignant neoplasm of prostate: Secondary | ICD-10-CM | POA: Diagnosis not present

## 2017-11-04 ENCOUNTER — Other Ambulatory Visit: Payer: Self-pay | Admitting: Family

## 2017-11-13 DIAGNOSIS — Z8546 Personal history of malignant neoplasm of prostate: Secondary | ICD-10-CM | POA: Diagnosis not present

## 2017-11-20 DIAGNOSIS — C61 Malignant neoplasm of prostate: Secondary | ICD-10-CM | POA: Diagnosis not present

## 2017-11-20 DIAGNOSIS — N5231 Erectile dysfunction following radical prostatectomy: Secondary | ICD-10-CM | POA: Diagnosis not present

## 2017-11-20 DIAGNOSIS — R9721 Rising PSA following treatment for malignant neoplasm of prostate: Secondary | ICD-10-CM | POA: Diagnosis not present

## 2017-11-21 ENCOUNTER — Encounter: Payer: Self-pay | Admitting: Radiation Oncology

## 2017-12-06 ENCOUNTER — Encounter: Payer: Self-pay | Admitting: Radiation Oncology

## 2017-12-06 NOTE — Progress Notes (Signed)
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  If Prostate Cancer, Gleason Score is (3 + 4) and PSA is (4.58). Prostate volume:   11/13/2017 PSA  0.047 08/23/2017 PSA  0.034 06/12/2017 PSA  0.022 12/07/2016 PSA  0.015    Past/Anticipated interventions by urology, if any: prostate biopsy, prostatectomy, close PSA following, referral to radiation oncology for consideration of salvage radiation  Past/Anticipated interventions by medical oncology, if any: no  Weight changes, if any: no  Bowel/Bladder complaints, if any: Occasionally has some very mild stress incontinence when lifting heavy objects. Denies dysuria or hematuria. IPSS 1 with nocturia.   Nausea/Vomiting, if any: no  Pain issues, if any:  no  SAFETY ISSUES:  Prior radiation? no  Pacemaker/ICD? no  Possible current pregnancy? no  Is the patient on methotrexate? no  Current Complaints / other details:  67 year old male. Married. NKDA. Retired. Resides in [place] with wife. Older sister with hx of non hodgkins lymphoma. Maternal GF with hx of colon ca.

## 2017-12-10 ENCOUNTER — Other Ambulatory Visit: Payer: Self-pay

## 2017-12-10 ENCOUNTER — Ambulatory Visit
Admission: RE | Admit: 2017-12-10 | Discharge: 2017-12-10 | Disposition: A | Discharge status: Home / Self Care | Payer: Medicare Other | Source: Ambulatory Visit | Attending: Radiation Oncology | Admitting: Radiation Oncology

## 2017-12-10 ENCOUNTER — Encounter: Payer: Self-pay | Admitting: Medical Oncology

## 2017-12-10 ENCOUNTER — Encounter: Payer: Self-pay | Admitting: Radiation Oncology

## 2017-12-10 VITALS — BP 146/85 | HR 105 | Temp 97.8°F | Resp 18 | Ht 66.0 in | Wt 246.6 lb

## 2017-12-10 DIAGNOSIS — Z79899 Other long term (current) drug therapy: Secondary | ICD-10-CM | POA: Insufficient documentation

## 2017-12-10 DIAGNOSIS — K219 Gastro-esophageal reflux disease without esophagitis: Secondary | ICD-10-CM | POA: Insufficient documentation

## 2017-12-10 DIAGNOSIS — Z8719 Personal history of other diseases of the digestive system: Secondary | ICD-10-CM | POA: Diagnosis not present

## 2017-12-10 DIAGNOSIS — R9721 Rising PSA following treatment for malignant neoplasm of prostate: Secondary | ICD-10-CM | POA: Diagnosis not present

## 2017-12-10 DIAGNOSIS — Z87891 Personal history of nicotine dependence: Secondary | ICD-10-CM | POA: Insufficient documentation

## 2017-12-10 DIAGNOSIS — Z7982 Long term (current) use of aspirin: Secondary | ICD-10-CM | POA: Diagnosis not present

## 2017-12-10 DIAGNOSIS — I10 Essential (primary) hypertension: Secondary | ICD-10-CM | POA: Diagnosis not present

## 2017-12-10 DIAGNOSIS — Z86010 Personal history of colonic polyps: Secondary | ICD-10-CM | POA: Insufficient documentation

## 2017-12-10 DIAGNOSIS — Z800 Family history of malignant neoplasm of digestive organs: Secondary | ICD-10-CM | POA: Diagnosis not present

## 2017-12-10 DIAGNOSIS — C61 Malignant neoplasm of prostate: Secondary | ICD-10-CM

## 2017-12-10 DIAGNOSIS — Z9079 Acquired absence of other genital organ(s): Secondary | ICD-10-CM | POA: Diagnosis not present

## 2017-12-10 HISTORY — DX: Malignant neoplasm of prostate: C61

## 2017-12-10 NOTE — Progress Notes (Signed)
Introduced myself to Luis Knapp and his wife as the prostate nurse navigator and my role. He is post robotic prostatectomy with a slowly rising PSA. He did have a positive margin. He states he did very well with surgery and recovery so this is a disappointment having to get radiation. I gave them my business card and will continue to follow. I asked them to call with questions or concerns.

## 2017-12-10 NOTE — Progress Notes (Signed)
Radiation Oncology         (336) [phone] ________________________________  Initial Outpatient Consultation  Name: Luis Knapp MRN: [id number]  Date: 12/10/2017  DOB: May 09, 1951  [place]:[id number], Ples Specter, FNP  Ardis Hughs, MD   REFERRING PHYSICIAN: Ardis Hughs, MD  DIAGNOSIS: 67 y.o. 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 Prostate cancer ([place]) C61     HISTORY OF PRESENT ILLNESS::Luis Knapp is a 67 y.o. gentleman diagnosed with prostate cancer for elevated PSA of 4.58 on 08/25/16 with these biopsy findings:    He underwent robotic assisted laparoscopic radical prostatectomy and lymph node dissection on 11/01/16.  He had Gleason 3+4 disease bilaterally but mostly on the right.  The capsule was transected with tumor at the margin in the right posterior gland. There was no ECE or SV invasion.  5 lymph nodes were negative.   Post-op PSA is detectable and rising: 12/07/2016: <0.015 06/12/2017: 0.022 08/23/2017: 0.034 11/13/2017: 0.047  The patient reviewed the PSA results with his urologist and he has kindly been referred today for discussion of salvage radiation treatment.   PREVIOUS RADIATION THERAPY: No  PAST MEDICAL HISTORY:  has a past medical history of Allergic rhinitis due to pollen, Allergy, Diverticulosis of colon (without mention of hemorrhage), GERD (gastroesophageal reflux disease), History of colon polyps, Prostate cancer ([place]) (11/01/2016), [name], Unspecified essential hypertension, and Wears glasses.    PAST SURGICAL HISTORY: Past Surgical History:  Procedure Laterality Date  . COLONOSCOPY    . COLONOSCOPY WITH PROPOFOL  10-30-2011  &  2008   POLYPECTOMY  . HYDROCELE EXCISION Right 07/18/2013   Procedure: RIGHT HYDROCELECTOMY ADULT;  Surgeon: Ardis Hughs, MD;  [facility];   Service: Urology;  Laterality: Right;  . LYMPH NODE DISSECTION Bilateral 11/01/2016   Procedure: BILATERAL PELVIC LYMPH NODE DISSECTION;  Surgeon: Ardis Hughs, MD;  [facility];  Service: Urology;  Laterality: Bilateral;  . POLYPECTOMY    . ROBOT ASSISTED LAPAROSCOPIC RADICAL PROSTATECTOMY N/A 11/01/2016   Procedure: XI ROBOTIC ASSISTED LAPAROSCOPIC RADICAL PROSTATECTOMY;  Surgeon: Ardis Hughs, MD;  [facility];  Service: Urology;  Laterality: N/A;    FAMILY HISTORY: family history includes Colon cancer in his maternal grandfather; Coronary artery disease in his father; Dementia in his father and mother; Diabetes in his sister; Heart attack in his father; Heart disease in his father; Hypertension in his mother; Kidney disease in his mother; Macular degeneration in his mother; Non-Hodgkin's lymphoma in his sister; Pulmonary embolism in his father.  SOCIAL HISTORY:  reports that he quit smoking about 21 years ago. His smoking use included cigarettes. He has a 75.00 pack-year smoking history. He has never used smokeless tobacco. He reports that he does not drink alcohol or use drugs. Married. Retired. Resides here in [place] with his wife.   ALLERGIES: Patient has no known allergies.  MEDICATIONS:  Current Outpatient Medications  Medication Sig Dispense Refill  . aspirin EC 81 MG tablet Take 81 mg by mouth daily.    . calcium carbonate (TUMS - DOSED IN MG ELEMENTAL CALCIUM) 500 MG chewable tablet Chew 1 tablet by mouth 3 (three) times daily as needed for indigestion or heartburn.     . Cholecalciferol (VITAMIN D-3) 1000 units CAPS Take 1 capsule by mouth daily.    . hydrochlorothiazide (HYDRODIURIL) 25 MG tablet TAKE  1 TABLET BY MOUTH EVERY DAY 90 tablet 2  . Multiple Vitamins-Minerals (MULTIVITAMIN ADULT PO) Take 1 tablet by mouth daily.    . sildenafil (REVATIO) 20 MG tablet Take 20 mg by mouth daily.     . vitamin B-12 (CYANOCOBALAMIN) 250 MCG tablet Take 250 mcg by  mouth daily.    Marland Kitchen ibuprofen (ADVIL,MOTRIN) 200 MG tablet Take 400 mg by mouth every 6 (six) hours as needed.     No current facility-administered medications for this encounter.     REVIEW OF SYSTEMS:  A 15 point review of systems is documented in the electronic medical record. This was obtained by the nursing staff. However, I reviewed this with the patient to discuss relevant findings and make appropriate changes.  The patient completed an IPSS and IIEF questionnaire.  His IPSS score was 1 indicating mild urinary outflow obstructive symptoms with nocturia x1.  He reports occasional, very mild stress incontinence with lifting heavy objects.  He indicated that his erectile function is able to complete sexual activity sometimes.   PHYSICAL EXAM: This patient is in no acute distress.  He is alert and oriented.   height is 5\' 6"  (1.676 m) and weight is 246 lb 9.6 oz (111.9 kg). His oral temperature is 97.8 F (36.6 C). His blood pressure is 146/85 (abnormal) and his pulse is 105 (abnormal). His respiration is 18 and oxygen saturation is 97%.  He exhibits no respiratory distress or labored breathing.  He appears neurologically intact.  His mood is pleasant.  His affect is appropriate.   KPS = 90  100 - Normal; no complaints; no evidence of disease. 90   - Able to carry on normal activity; minor signs or symptoms of disease. 80   - Normal activity with effort; some signs or symptoms of disease. 52   - Cares for self; unable to carry on normal activity or to do active work. 60   - Requires occasional assistance, but is able to care for most of his personal needs. 50   - Requires considerable assistance and frequent medical care. 46   - Disabled; requires special care and assistance. 33   - Severely disabled; hospital admission is indicated although death not imminent. 74   - Very sick; hospital admission necessary; active supportive treatment necessary. 10   - Moribund; fatal processes progressing  rapidly. 0     - Dead  Karnofsky DA, Abelmann [place], Craver LS and Burchenal [facility] [phone]) The use of the nitrogen mustards in the palliative treatment of carcinoma: with particular reference to bronchogenic carcinoma Cancer 1 634-56   LABORATORY DATA:  Lab Results  Component Value Date   WBC 8.7 01/17/2017   HGB 15.0 01/17/2017   HCT 44.0 01/17/2017   MCV 85.5 01/17/2017   PLT 287.0 01/17/2017   Lab Results  Component Value Date   NA 139 01/17/2017   K 3.7 01/17/2017   CL 100 01/17/2017   CO2 29 01/17/2017   Lab Results  Component Value Date   ALT 17 01/17/2017   AST 14 01/17/2017   ALKPHOS 72 01/17/2017   BILITOT 0.3 01/17/2017     RADIOGRAPHY: No results found.    IMPRESSION: This is a 67 y.o. 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  He has a biochemically recurrent prostate cancer with positive margin.  Accordingly he is eligible for a variety of potential treatment  options including salvage prostatic fossa radiotherapy.  PLAN: Today I reviewed the findings and workup thus far.  We discussed the natural history of prostate cancer.  We reviewed the the implications of positive margins, extracapsular extension, and seminal vesicle involvement on the risk of prostate cancer recurrence. We reviewed some of the evidence suggesting an advantage for patients who undergo adjuvant radiotherapy in the setting in terms of disease control and overall survival. We discussed radiation treatment directed to the prostatic fossa with regard to the logistics and delivery of external beam radiation treatment.  The patient would like to proceed with salvage radiation therapy and is scheduled for CT simulation on Thursday August 1st at 2:00 PM.  I enjoyed meeting with him today, and will look forward to participating in the care of this very nice gentleman.   I spent 60 minutes face to  face with the patient and more than 50% of that time was spent in counseling and/or coordination of care.   ------------------------------------------------  Sheral Apley. Tammi Klippel, M.D.  This document serves as a record of services personally performed by Tyler Pita, MD. It was created on his behalf by Rae Lips, a trained medical scribe. The creation of this record is based on the scribe's personal observations and the provider's statements to them. This document has been checked and approved by the attending provider.

## 2017-12-10 NOTE — Progress Notes (Signed)
See progress note under physician encounter. 

## 2017-12-13 ENCOUNTER — Ambulatory Visit
Admission: RE | Admit: 2017-12-13 | Discharge: 2017-12-13 | Disposition: A | Discharge status: Home / Self Care | Payer: Medicare Other | Source: Ambulatory Visit | Attending: Radiation Oncology | Admitting: Radiation Oncology

## 2017-12-13 ENCOUNTER — Encounter: Payer: Self-pay | Admitting: Medical Oncology

## 2017-12-13 DIAGNOSIS — C61 Malignant neoplasm of prostate: Secondary | ICD-10-CM | POA: Insufficient documentation

## 2017-12-13 DIAGNOSIS — Z510 Encounter for antineoplastic radiation therapy: Secondary | ICD-10-CM | POA: Diagnosis not present

## 2017-12-13 NOTE — Progress Notes (Signed)
  Radiation Oncology         (336) [phone] ________________________________  Name: Luis Knapp MRN: [id number]  Date: 12/13/2017  DOB: [birth year]  SIMULATION AND TREATMENT PLANNING NOTE    ICD-10-CM   1. Prostate cancer [facility]) C61     DIAGNOSIS:  67 y.o. 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  Identity was confirmed.  All relevant records and images related to the planned course of therapy were reviewed.  The patient freely provided informed written consent to proceed with treatment after reviewing the details related to the planned course of therapy. The consent form was witnessed and verified by the simulation staff.  Then, the patient was set-up in a stable reproducible supine position for radiation therapy.  A vacuum lock pillow device was custom fabricated to position his legs in a reproducible immobilized position.  Then, I performed a urethrogram under sterile conditions to identify the prostatic apex.  CT images were obtained.  Surface markings were placed.  The CT images were loaded into the planning software.  Then the prostate target and avoidance structures including the rectum, bladder, bowel and hips were contoured.  Treatment planning then occurred.  The radiation prescription was entered and confirmed.  A total of one complex treatment devices was fabricated. I have requested : Intensity Modulated Radiotherapy (IMRT) is medically necessary for this case for the following reason:  Rectal sparing.Marland Kitchen  PLAN:  The patient will receive 68.4 Gy in 38 fractions.  ________________________________  Sheral Apley Tammi Klippel, M.D.

## 2017-12-19 DIAGNOSIS — C61 Malignant neoplasm of prostate: Secondary | ICD-10-CM | POA: Diagnosis not present

## 2017-12-19 DIAGNOSIS — Z510 Encounter for antineoplastic radiation therapy: Secondary | ICD-10-CM | POA: Diagnosis not present

## 2017-12-24 ENCOUNTER — Encounter: Payer: Self-pay | Admitting: Medical Oncology

## 2017-12-24 ENCOUNTER — Ambulatory Visit
Admission: RE | Admit: 2017-12-24 | Discharge: 2017-12-24 | Disposition: A | Discharge status: Home / Self Care | Payer: Medicare Other | Source: Ambulatory Visit | Attending: Radiation Oncology | Admitting: Radiation Oncology

## 2017-12-24 DIAGNOSIS — Z510 Encounter for antineoplastic radiation therapy: Secondary | ICD-10-CM | POA: Diagnosis not present

## 2017-12-24 DIAGNOSIS — C61 Malignant neoplasm of prostate: Secondary | ICD-10-CM | POA: Diagnosis not present

## 2017-12-25 ENCOUNTER — Ambulatory Visit
Admission: RE | Admit: 2017-12-25 | Discharge: 2017-12-25 | Disposition: A | Discharge status: Home / Self Care | Payer: Medicare Other | Source: Ambulatory Visit | Attending: Radiation Oncology | Admitting: Radiation Oncology

## 2017-12-25 DIAGNOSIS — Z510 Encounter for antineoplastic radiation therapy: Secondary | ICD-10-CM | POA: Diagnosis not present

## 2017-12-25 DIAGNOSIS — C61 Malignant neoplasm of prostate: Secondary | ICD-10-CM | POA: Diagnosis not present

## 2017-12-26 ENCOUNTER — Ambulatory Visit
Admission: RE | Admit: 2017-12-26 | Discharge: 2017-12-26 | Disposition: A | Discharge status: Home / Self Care | Payer: Medicare Other | Source: Ambulatory Visit | Attending: Radiation Oncology | Admitting: Radiation Oncology

## 2017-12-26 DIAGNOSIS — Z510 Encounter for antineoplastic radiation therapy: Secondary | ICD-10-CM | POA: Diagnosis not present

## 2017-12-26 DIAGNOSIS — C61 Malignant neoplasm of prostate: Secondary | ICD-10-CM | POA: Diagnosis not present

## 2017-12-27 ENCOUNTER — Ambulatory Visit
Admission: RE | Admit: 2017-12-27 | Discharge: 2017-12-27 | Disposition: A | Discharge status: Home / Self Care | Payer: Medicare Other | Source: Ambulatory Visit | Attending: Radiation Oncology | Admitting: Radiation Oncology

## 2017-12-27 ENCOUNTER — Other Ambulatory Visit: Payer: Self-pay | Admitting: Family

## 2017-12-27 DIAGNOSIS — C61 Malignant neoplasm of prostate: Secondary | ICD-10-CM | POA: Diagnosis not present

## 2017-12-27 DIAGNOSIS — Z510 Encounter for antineoplastic radiation therapy: Secondary | ICD-10-CM | POA: Diagnosis not present

## 2017-12-28 ENCOUNTER — Ambulatory Visit
Admission: RE | Admit: 2017-12-28 | Discharge: 2017-12-28 | Disposition: A | Discharge status: Home / Self Care | Payer: Medicare Other | Source: Ambulatory Visit | Attending: Radiation Oncology | Admitting: Radiation Oncology

## 2017-12-28 DIAGNOSIS — C61 Malignant neoplasm of prostate: Secondary | ICD-10-CM | POA: Diagnosis not present

## 2017-12-28 DIAGNOSIS — Z510 Encounter for antineoplastic radiation therapy: Secondary | ICD-10-CM | POA: Diagnosis not present

## 2017-12-31 ENCOUNTER — Ambulatory Visit
Admission: RE | Admit: 2017-12-31 | Discharge: 2017-12-31 | Disposition: A | Discharge status: Home / Self Care | Payer: Medicare Other | Source: Ambulatory Visit | Attending: Radiation Oncology | Admitting: Radiation Oncology

## 2017-12-31 DIAGNOSIS — Z510 Encounter for antineoplastic radiation therapy: Secondary | ICD-10-CM | POA: Diagnosis not present

## 2017-12-31 DIAGNOSIS — C61 Malignant neoplasm of prostate: Secondary | ICD-10-CM | POA: Diagnosis not present

## 2018-01-01 ENCOUNTER — Ambulatory Visit
Admission: RE | Admit: 2018-01-01 | Discharge: 2018-01-01 | Disposition: A | Discharge status: Home / Self Care | Payer: Medicare Other | Source: Ambulatory Visit | Attending: Radiation Oncology | Admitting: Radiation Oncology

## 2018-01-01 ENCOUNTER — Encounter: Payer: Self-pay | Admitting: Medical Oncology

## 2018-01-01 DIAGNOSIS — Z510 Encounter for antineoplastic radiation therapy: Secondary | ICD-10-CM | POA: Diagnosis not present

## 2018-01-01 DIAGNOSIS — C61 Malignant neoplasm of prostate: Secondary | ICD-10-CM | POA: Diagnosis not present

## 2018-01-02 ENCOUNTER — Ambulatory Visit
Admission: RE | Admit: 2018-01-02 | Discharge: 2018-01-02 | Disposition: A | Discharge status: Home / Self Care | Payer: Medicare Other | Source: Ambulatory Visit | Attending: Radiation Oncology | Admitting: Radiation Oncology

## 2018-01-02 DIAGNOSIS — Z510 Encounter for antineoplastic radiation therapy: Secondary | ICD-10-CM | POA: Diagnosis not present

## 2018-01-02 DIAGNOSIS — C61 Malignant neoplasm of prostate: Secondary | ICD-10-CM | POA: Diagnosis not present

## 2018-01-03 ENCOUNTER — Ambulatory Visit
Admission: RE | Admit: 2018-01-03 | Discharge: 2018-01-03 | Disposition: A | Discharge status: Home / Self Care | Payer: Medicare Other | Source: Ambulatory Visit | Attending: Radiation Oncology | Admitting: Radiation Oncology

## 2018-01-03 DIAGNOSIS — Z510 Encounter for antineoplastic radiation therapy: Secondary | ICD-10-CM | POA: Diagnosis not present

## 2018-01-03 DIAGNOSIS — C61 Malignant neoplasm of prostate: Secondary | ICD-10-CM | POA: Diagnosis not present

## 2018-01-04 ENCOUNTER — Ambulatory Visit
Admission: RE | Admit: 2018-01-04 | Discharge: 2018-01-04 | Disposition: A | Discharge status: Home / Self Care | Payer: Medicare Other | Source: Ambulatory Visit | Attending: Radiation Oncology | Admitting: Radiation Oncology

## 2018-01-04 DIAGNOSIS — Z510 Encounter for antineoplastic radiation therapy: Secondary | ICD-10-CM | POA: Diagnosis not present

## 2018-01-04 DIAGNOSIS — C61 Malignant neoplasm of prostate: Secondary | ICD-10-CM | POA: Diagnosis not present

## 2018-01-07 ENCOUNTER — Ambulatory Visit
Admission: RE | Admit: 2018-01-07 | Discharge: 2018-01-07 | Disposition: A | Discharge status: Home / Self Care | Payer: Medicare Other | Source: Ambulatory Visit | Attending: Radiation Oncology | Admitting: Radiation Oncology

## 2018-01-07 DIAGNOSIS — C61 Malignant neoplasm of prostate: Secondary | ICD-10-CM | POA: Diagnosis not present

## 2018-01-07 DIAGNOSIS — Z510 Encounter for antineoplastic radiation therapy: Secondary | ICD-10-CM | POA: Diagnosis not present

## 2018-01-08 ENCOUNTER — Ambulatory Visit
Admission: RE | Admit: 2018-01-08 | Discharge: 2018-01-08 | Disposition: A | Discharge status: Home / Self Care | Payer: Medicare Other | Source: Ambulatory Visit | Attending: Radiation Oncology | Admitting: Radiation Oncology

## 2018-01-08 DIAGNOSIS — Z510 Encounter for antineoplastic radiation therapy: Secondary | ICD-10-CM | POA: Diagnosis not present

## 2018-01-08 DIAGNOSIS — C61 Malignant neoplasm of prostate: Secondary | ICD-10-CM | POA: Diagnosis not present

## 2018-01-09 ENCOUNTER — Ambulatory Visit
Admission: RE | Admit: 2018-01-09 | Discharge: 2018-01-09 | Disposition: A | Discharge status: Home / Self Care | Payer: Medicare Other | Source: Ambulatory Visit | Attending: Radiation Oncology | Admitting: Radiation Oncology

## 2018-01-09 ENCOUNTER — Ambulatory Visit (INDEPENDENT_AMBULATORY_CARE_PROVIDER_SITE_OTHER): Payer: Medicare Other | Admitting: Family

## 2018-01-09 ENCOUNTER — Encounter: Payer: Self-pay | Admitting: Family

## 2018-01-09 VITALS — BP 150/80 | HR 89 | Temp 98.3°F | Ht 66.0 in | Wt 248.0 lb

## 2018-01-09 DIAGNOSIS — I10 Essential (primary) hypertension: Secondary | ICD-10-CM

## 2018-01-09 DIAGNOSIS — C61 Malignant neoplasm of prostate: Secondary | ICD-10-CM | POA: Diagnosis not present

## 2018-01-09 DIAGNOSIS — Z510 Encounter for antineoplastic radiation therapy: Secondary | ICD-10-CM | POA: Diagnosis not present

## 2018-01-09 MED ORDER — LOSARTAN POTASSIUM-HCTZ 100-25 MG PO TABS: 1.0000 | ORAL_TABLET | Freq: Every day | ORAL | 1 refills | Status: DC

## 2018-01-09 NOTE — Progress Notes (Signed)
Luis Knapp is a 67 y.o. male with the following history as recorded in EpicCare:  Patient Active Problem List   Diagnosis Date Noted  . Rash 04/17/2017  . Prostate cancer ([place]) 11/01/2016  . Medicare annual wellness visit, initial 10/18/2015  . Hydrocele, right 06/24/2013  . Routine general medical examination at a health care facility 12/04/2010  . OBESITY, CLASS II 05/18/2010  . SCIATICA 05/18/2010  . POLYP, COLON 08/03/2007  . Essential hypertension 08/03/2007  . DIVERTICULOSIS, COLON 08/03/2007    Current Outpatient Medications  Medication Sig Dispense Refill  . aspirin EC 81 MG tablet Take 81 mg by mouth daily.    . calcium carbonate (TUMS - DOSED IN MG ELEMENTAL CALCIUM) 500 MG chewable tablet Chew 1 tablet by mouth 3 (three) times daily as needed for indigestion or heartburn.     . Cholecalciferol (VITAMIN D-3) 1000 units CAPS Take 1 capsule by mouth daily.    Marland Kitchen ibuprofen (ADVIL,MOTRIN) 200 MG tablet Take 400 mg by mouth every 6 (six) hours as needed.    . Multiple Vitamins-Minerals (MULTIVITAMIN ADULT PO) Take 1 tablet by mouth daily.    . sildenafil (REVATIO) 20 MG tablet Take 20 mg by mouth daily.     . vitamin B-12 (CYANOCOBALAMIN) 250 MCG tablet Take 250 mcg by mouth daily.    Marland Kitchen losartan-hydrochlorothiazide (HYZAAR) 100-25 MG tablet Take 1 tablet by mouth daily. 30 tablet 1   No current facility-administered medications for this visit.     Allergies: Patient has no known allergies.  Past Medical History:  Diagnosis Date  . Allergic rhinitis due to pollen   . Allergy   . Diverticulosis of colon (without mention of hemorrhage)   . GERD (gastroesophageal reflux disease)   . History of colon polyps    2008--  PRE CANEROUS/   2013-- ADENOMA  . Prostate cancer ([place]) 11/01/2016   prostate cancer  . [name]   . Unspecified essential hypertension   . Wears glasses     Past Surgical History:  Procedure Laterality Date  . COLONOSCOPY    . COLONOSCOPY WITH  PROPOFOL  10-30-2011  &  2008   POLYPECTOMY  . HYDROCELE EXCISION Right 07/18/2013   Procedure: RIGHT HYDROCELECTOMY ADULT;  Surgeon: Ardis Hughs, MD;  [facility];  Service: Urology;  Laterality: Right;  . LYMPH NODE DISSECTION Bilateral 11/01/2016   Procedure: BILATERAL PELVIC LYMPH NODE DISSECTION;  Surgeon: Ardis Hughs, MD;  [facility];  Service: Urology;  Laterality: Bilateral;  . POLYPECTOMY    . ROBOT ASSISTED LAPAROSCOPIC RADICAL PROSTATECTOMY N/A 11/01/2016   Procedure: XI ROBOTIC ASSISTED LAPAROSCOPIC RADICAL PROSTATECTOMY;  Surgeon: Ardis Hughs, MD;  [facility];  Service: Urology;  Laterality: N/A;    Family History  Problem Relation Age of Onset  . Coronary artery disease Father   . Heart attack Father        2nd one fatal  . Pulmonary embolism Father        ?  Marland Kitchen Dementia Father   . Heart disease Father   . Dementia Mother   . Hypertension Mother   . Macular degeneration Mother   . Kidney disease Mother   . Diabetes Sister   . Non-Hodgkin's lymphoma Sister   . Colon cancer Maternal Grandfather   . Prostate cancer Neg Hx   . Rectal cancer Neg Hx   . Colon polyps Neg Hx   . Esophageal cancer Neg Hx     Social  History   Tobacco Use  . Smoking status: Former Smoker    Packs/day: 3.00    Years: 25.00    Pack years: 75.00    Types: Cigarettes    Last attempt to quit: 10/16/1996    Years since quitting: 21.2  . Smokeless tobacco: Never Used  Substance Use Topics  . Alcohol use: No    Subjective:  Patient presents to transfer care from provider who has left office; history of hypertension- currently taking HCTZ 25 mg daily; Denies any chest pain, shortness of breath, blurred vision or headache. Checks blood pressure at home and notes averaging around 140/80 at home; agreeable to considering medication change; Has 13 more rounds of radiation for prostate cancer- last treatment scheduled for 02/14/2018;     Objective:  Vitals:   01/09/18 1054  BP: (!) 150/80  Pulse: 89  Temp: 98.3 F (36.8 C)  TempSrc: Oral  SpO2: 96%  Weight: 248 lb (112.5 kg)  Height: 5\' 6"  (1.676 m)    General: Well developed, well nourished, in no acute distress  Skin : Warm and dry.  Head: Normocephalic and atraumatic  Lungs: Respirations unlabored; clear to auscultation bilaterally without wheeze, rales, rhonchi  CVS exam: normal rate and regular rhythm.  Neurologic: Alert and oriented; speech intact; face symmetrical; moves all extremities well; CNII-XII intact without focal deficit   Assessment:  1. Essential hypertension     Plan:  Uncontrolled; change to Losartan HCT 100/25 1 po qd; follow-up for re-check/ CPE in early October;   Return in about 6 weeks (around 02/20/2018) for CPE.  No orders of the defined types were placed in this encounter.   Requested Prescriptions   Signed Prescriptions Disp Refills  . losartan-hydrochlorothiazide (HYZAAR) 100-25 MG tablet 30 tablet 1    Sig: Take 1 tablet by mouth daily.

## 2018-01-10 ENCOUNTER — Ambulatory Visit
Admission: RE | Admit: 2018-01-10 | Discharge: 2018-01-10 | Disposition: A | Discharge status: Home / Self Care | Payer: Medicare Other | Source: Ambulatory Visit | Attending: Radiation Oncology | Admitting: Radiation Oncology

## 2018-01-10 DIAGNOSIS — Z510 Encounter for antineoplastic radiation therapy: Secondary | ICD-10-CM | POA: Diagnosis not present

## 2018-01-10 DIAGNOSIS — C61 Malignant neoplasm of prostate: Secondary | ICD-10-CM | POA: Diagnosis not present

## 2018-01-11 ENCOUNTER — Ambulatory Visit
Admission: RE | Admit: 2018-01-11 | Discharge: 2018-01-11 | Disposition: A | Discharge status: Home / Self Care | Payer: Medicare Other | Source: Ambulatory Visit | Attending: Radiation Oncology | Admitting: Radiation Oncology

## 2018-01-11 DIAGNOSIS — C61 Malignant neoplasm of prostate: Secondary | ICD-10-CM | POA: Diagnosis not present

## 2018-01-11 DIAGNOSIS — Z510 Encounter for antineoplastic radiation therapy: Secondary | ICD-10-CM | POA: Diagnosis not present

## 2018-01-12 ENCOUNTER — Other Ambulatory Visit: Payer: Self-pay | Admitting: Family

## 2018-01-15 ENCOUNTER — Ambulatory Visit
Admission: RE | Admit: 2018-01-15 | Discharge: 2018-01-15 | Disposition: A | Discharge status: Home / Self Care | Payer: Medicare Other | Source: Ambulatory Visit | Attending: Radiation Oncology | Admitting: Radiation Oncology

## 2018-01-15 DIAGNOSIS — C61 Malignant neoplasm of prostate: Secondary | ICD-10-CM | POA: Diagnosis not present

## 2018-01-15 DIAGNOSIS — Z510 Encounter for antineoplastic radiation therapy: Secondary | ICD-10-CM | POA: Insufficient documentation

## 2018-01-16 ENCOUNTER — Ambulatory Visit
Admission: RE | Admit: 2018-01-16 | Discharge: 2018-01-16 | Disposition: A | Discharge status: Home / Self Care | Payer: Medicare Other | Source: Ambulatory Visit | Attending: Radiation Oncology | Admitting: Radiation Oncology

## 2018-01-16 DIAGNOSIS — C61 Malignant neoplasm of prostate: Secondary | ICD-10-CM | POA: Diagnosis not present

## 2018-01-16 DIAGNOSIS — Z510 Encounter for antineoplastic radiation therapy: Secondary | ICD-10-CM | POA: Diagnosis not present

## 2018-01-17 ENCOUNTER — Ambulatory Visit
Admission: RE | Admit: 2018-01-17 | Discharge: 2018-01-17 | Disposition: A | Discharge status: Home / Self Care | Payer: Medicare Other | Source: Ambulatory Visit | Attending: Radiation Oncology | Admitting: Radiation Oncology

## 2018-01-17 DIAGNOSIS — C61 Malignant neoplasm of prostate: Secondary | ICD-10-CM | POA: Diagnosis not present

## 2018-01-17 DIAGNOSIS — Z510 Encounter for antineoplastic radiation therapy: Secondary | ICD-10-CM | POA: Diagnosis not present

## 2018-01-18 ENCOUNTER — Ambulatory Visit
Admission: RE | Admit: 2018-01-18 | Discharge: 2018-01-18 | Disposition: A | Discharge status: Home / Self Care | Payer: Medicare Other | Source: Ambulatory Visit | Attending: Radiation Oncology | Admitting: Radiation Oncology

## 2018-01-18 DIAGNOSIS — C61 Malignant neoplasm of prostate: Secondary | ICD-10-CM | POA: Diagnosis not present

## 2018-01-18 DIAGNOSIS — Z510 Encounter for antineoplastic radiation therapy: Secondary | ICD-10-CM | POA: Diagnosis not present

## 2018-01-21 ENCOUNTER — Ambulatory Visit
Admission: RE | Admit: 2018-01-21 | Discharge: 2018-01-21 | Disposition: A | Discharge status: Home / Self Care | Payer: Medicare Other | Source: Ambulatory Visit | Attending: Radiation Oncology | Admitting: Radiation Oncology

## 2018-01-21 DIAGNOSIS — Z510 Encounter for antineoplastic radiation therapy: Secondary | ICD-10-CM | POA: Diagnosis not present

## 2018-01-21 DIAGNOSIS — C61 Malignant neoplasm of prostate: Secondary | ICD-10-CM | POA: Diagnosis not present

## 2018-01-22 ENCOUNTER — Ambulatory Visit
Admission: RE | Admit: 2018-01-22 | Discharge: 2018-01-22 | Disposition: A | Discharge status: Home / Self Care | Payer: Medicare Other | Source: Ambulatory Visit | Attending: Radiation Oncology | Admitting: Radiation Oncology

## 2018-01-22 DIAGNOSIS — Z510 Encounter for antineoplastic radiation therapy: Secondary | ICD-10-CM | POA: Diagnosis not present

## 2018-01-22 DIAGNOSIS — C61 Malignant neoplasm of prostate: Secondary | ICD-10-CM | POA: Diagnosis not present

## 2018-01-23 ENCOUNTER — Ambulatory Visit
Admission: RE | Admit: 2018-01-23 | Discharge: 2018-01-23 | Disposition: A | Discharge status: Home / Self Care | Payer: Medicare Other | Source: Ambulatory Visit | Attending: Radiation Oncology | Admitting: Radiation Oncology

## 2018-01-23 DIAGNOSIS — C61 Malignant neoplasm of prostate: Secondary | ICD-10-CM | POA: Diagnosis not present

## 2018-01-23 DIAGNOSIS — Z510 Encounter for antineoplastic radiation therapy: Secondary | ICD-10-CM | POA: Diagnosis not present

## 2018-01-24 ENCOUNTER — Ambulatory Visit
Admission: RE | Admit: 2018-01-24 | Discharge: 2018-01-24 | Disposition: A | Discharge status: Home / Self Care | Payer: Medicare Other | Source: Ambulatory Visit | Attending: Radiation Oncology | Admitting: Radiation Oncology

## 2018-01-24 DIAGNOSIS — Z510 Encounter for antineoplastic radiation therapy: Secondary | ICD-10-CM | POA: Diagnosis not present

## 2018-01-24 DIAGNOSIS — C61 Malignant neoplasm of prostate: Secondary | ICD-10-CM | POA: Diagnosis not present

## 2018-01-25 ENCOUNTER — Encounter: Payer: Self-pay | Admitting: Medical Oncology

## 2018-01-25 ENCOUNTER — Ambulatory Visit
Admission: RE | Admit: 2018-01-25 | Discharge: 2018-01-25 | Disposition: A | Discharge status: Home / Self Care | Payer: Medicare Other | Source: Ambulatory Visit | Attending: Radiation Oncology | Admitting: Radiation Oncology

## 2018-01-25 DIAGNOSIS — C61 Malignant neoplasm of prostate: Secondary | ICD-10-CM | POA: Diagnosis not present

## 2018-01-25 DIAGNOSIS — Z510 Encounter for antineoplastic radiation therapy: Secondary | ICD-10-CM | POA: Diagnosis not present

## 2018-01-28 ENCOUNTER — Ambulatory Visit
Admission: RE | Admit: 2018-01-28 | Discharge: 2018-01-28 | Disposition: A | Discharge status: Home / Self Care | Payer: Medicare Other | Source: Ambulatory Visit | Attending: Radiation Oncology | Admitting: Radiation Oncology

## 2018-01-28 DIAGNOSIS — Z510 Encounter for antineoplastic radiation therapy: Secondary | ICD-10-CM | POA: Diagnosis not present

## 2018-01-28 DIAGNOSIS — C61 Malignant neoplasm of prostate: Secondary | ICD-10-CM | POA: Diagnosis not present

## 2018-01-29 ENCOUNTER — Ambulatory Visit
Admission: RE | Admit: 2018-01-29 | Discharge: 2018-01-29 | Disposition: A | Discharge status: Home / Self Care | Payer: Medicare Other | Source: Ambulatory Visit | Attending: Radiation Oncology | Admitting: Radiation Oncology

## 2018-01-29 DIAGNOSIS — C61 Malignant neoplasm of prostate: Secondary | ICD-10-CM | POA: Diagnosis not present

## 2018-01-29 DIAGNOSIS — Z510 Encounter for antineoplastic radiation therapy: Secondary | ICD-10-CM | POA: Diagnosis not present

## 2018-01-30 ENCOUNTER — Ambulatory Visit
Admission: RE | Admit: 2018-01-30 | Discharge: 2018-01-30 | Disposition: A | Discharge status: Home / Self Care | Payer: Medicare Other | Source: Ambulatory Visit | Attending: Radiation Oncology | Admitting: Radiation Oncology

## 2018-01-30 DIAGNOSIS — Z510 Encounter for antineoplastic radiation therapy: Secondary | ICD-10-CM | POA: Diagnosis not present

## 2018-01-30 DIAGNOSIS — C61 Malignant neoplasm of prostate: Secondary | ICD-10-CM | POA: Diagnosis not present

## 2018-01-31 ENCOUNTER — Ambulatory Visit
Admission: RE | Admit: 2018-01-31 | Discharge: 2018-01-31 | Disposition: A | Discharge status: Home / Self Care | Payer: Medicare Other | Source: Ambulatory Visit | Attending: Radiation Oncology | Admitting: Radiation Oncology

## 2018-01-31 DIAGNOSIS — Z510 Encounter for antineoplastic radiation therapy: Secondary | ICD-10-CM | POA: Diagnosis not present

## 2018-01-31 DIAGNOSIS — C61 Malignant neoplasm of prostate: Secondary | ICD-10-CM | POA: Diagnosis not present

## 2018-02-01 ENCOUNTER — Ambulatory Visit
Admission: RE | Admit: 2018-02-01 | Discharge: 2018-02-01 | Disposition: A | Discharge status: Home / Self Care | Payer: Medicare Other | Source: Ambulatory Visit | Attending: Radiation Oncology | Admitting: Radiation Oncology

## 2018-02-01 DIAGNOSIS — Z510 Encounter for antineoplastic radiation therapy: Secondary | ICD-10-CM | POA: Diagnosis not present

## 2018-02-01 DIAGNOSIS — C61 Malignant neoplasm of prostate: Secondary | ICD-10-CM | POA: Diagnosis not present

## 2018-02-04 ENCOUNTER — Ambulatory Visit
Admission: RE | Admit: 2018-02-04 | Discharge: 2018-02-04 | Disposition: A | Discharge status: Home / Self Care | Payer: Medicare Other | Source: Ambulatory Visit | Attending: Radiation Oncology | Admitting: Radiation Oncology

## 2018-02-04 DIAGNOSIS — Z510 Encounter for antineoplastic radiation therapy: Secondary | ICD-10-CM | POA: Diagnosis not present

## 2018-02-04 DIAGNOSIS — C61 Malignant neoplasm of prostate: Secondary | ICD-10-CM | POA: Diagnosis not present

## 2018-02-05 ENCOUNTER — Ambulatory Visit
Admission: RE | Admit: 2018-02-05 | Discharge: 2018-02-05 | Disposition: A | Discharge status: Home / Self Care | Payer: Medicare Other | Source: Ambulatory Visit | Attending: Radiation Oncology | Admitting: Radiation Oncology

## 2018-02-05 DIAGNOSIS — Z510 Encounter for antineoplastic radiation therapy: Secondary | ICD-10-CM | POA: Diagnosis not present

## 2018-02-05 DIAGNOSIS — C61 Malignant neoplasm of prostate: Secondary | ICD-10-CM | POA: Diagnosis not present

## 2018-02-06 ENCOUNTER — Ambulatory Visit
Admission: RE | Admit: 2018-02-06 | Discharge: 2018-02-06 | Disposition: A | Discharge status: Home / Self Care | Payer: Medicare Other | Source: Ambulatory Visit | Attending: Radiation Oncology | Admitting: Radiation Oncology

## 2018-02-06 DIAGNOSIS — C61 Malignant neoplasm of prostate: Secondary | ICD-10-CM | POA: Diagnosis not present

## 2018-02-06 DIAGNOSIS — Z510 Encounter for antineoplastic radiation therapy: Secondary | ICD-10-CM | POA: Diagnosis not present

## 2018-02-07 ENCOUNTER — Ambulatory Visit
Admission: RE | Admit: 2018-02-07 | Discharge: 2018-02-07 | Disposition: A | Discharge status: Home / Self Care | Payer: Medicare Other | Source: Ambulatory Visit | Attending: Radiation Oncology | Admitting: Radiation Oncology

## 2018-02-07 DIAGNOSIS — Z510 Encounter for antineoplastic radiation therapy: Secondary | ICD-10-CM | POA: Diagnosis not present

## 2018-02-07 DIAGNOSIS — C61 Malignant neoplasm of prostate: Secondary | ICD-10-CM | POA: Diagnosis not present

## 2018-02-08 ENCOUNTER — Ambulatory Visit
Admission: RE | Admit: 2018-02-08 | Discharge: 2018-02-08 | Disposition: A | Discharge status: Home / Self Care | Payer: Medicare Other | Source: Ambulatory Visit | Attending: Radiation Oncology | Admitting: Radiation Oncology

## 2018-02-08 DIAGNOSIS — Z510 Encounter for antineoplastic radiation therapy: Secondary | ICD-10-CM | POA: Diagnosis not present

## 2018-02-08 DIAGNOSIS — C61 Malignant neoplasm of prostate: Secondary | ICD-10-CM | POA: Diagnosis not present

## 2018-02-11 ENCOUNTER — Ambulatory Visit
Admission: RE | Admit: 2018-02-11 | Discharge: 2018-02-11 | Disposition: A | Discharge status: Home / Self Care | Payer: Medicare Other | Source: Ambulatory Visit | Attending: Radiation Oncology | Admitting: Radiation Oncology

## 2018-02-11 DIAGNOSIS — Z510 Encounter for antineoplastic radiation therapy: Secondary | ICD-10-CM | POA: Diagnosis not present

## 2018-02-11 DIAGNOSIS — C61 Malignant neoplasm of prostate: Secondary | ICD-10-CM | POA: Diagnosis not present

## 2018-02-12 ENCOUNTER — Ambulatory Visit
Admission: RE | Admit: 2018-02-12 | Discharge: 2018-02-12 | Disposition: A | Discharge status: Home / Self Care | Payer: Medicare Other | Source: Ambulatory Visit | Attending: Radiation Oncology | Admitting: Radiation Oncology

## 2018-02-12 DIAGNOSIS — Z510 Encounter for antineoplastic radiation therapy: Secondary | ICD-10-CM | POA: Diagnosis not present

## 2018-02-12 DIAGNOSIS — C61 Malignant neoplasm of prostate: Secondary | ICD-10-CM | POA: Insufficient documentation

## 2018-02-13 ENCOUNTER — Ambulatory Visit
Admission: RE | Admit: 2018-02-13 | Discharge: 2018-02-13 | Disposition: A | Discharge status: Home / Self Care | Payer: Medicare Other | Source: Ambulatory Visit | Attending: Radiation Oncology | Admitting: Radiation Oncology

## 2018-02-13 DIAGNOSIS — C61 Malignant neoplasm of prostate: Secondary | ICD-10-CM | POA: Diagnosis not present

## 2018-02-13 DIAGNOSIS — Z510 Encounter for antineoplastic radiation therapy: Secondary | ICD-10-CM | POA: Diagnosis not present

## 2018-02-14 ENCOUNTER — Ambulatory Visit
Admission: RE | Admit: 2018-02-14 | Discharge: 2018-02-14 | Disposition: A | Discharge status: Home / Self Care | Payer: Medicare Other | Source: Ambulatory Visit | Attending: Radiation Oncology | Admitting: Radiation Oncology

## 2018-02-14 ENCOUNTER — Encounter: Payer: Self-pay | Admitting: Radiation Oncology

## 2018-02-14 DIAGNOSIS — C61 Malignant neoplasm of prostate: Secondary | ICD-10-CM | POA: Diagnosis not present

## 2018-02-14 DIAGNOSIS — Z510 Encounter for antineoplastic radiation therapy: Secondary | ICD-10-CM | POA: Diagnosis not present

## 2018-02-14 NOTE — Progress Notes (Signed)
  Radiation Oncology         [phone]) [phone] ________________________________  Name: Luis Knapp MRN: [id number]  Date: 02/14/2018  DOB: 06/04/1950  End of Treatment Note  Diagnosis:   67 y.o. 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 Image guidance was performed with megavoltage CT studies prior to each fraction. He was immobilized with a body fix lower extremity mold.  Narrative: The patient tolerated radiation treatment relatively well.  He experienced mild fatigue and very minor urinary irritation with nocturia x1-2. His bowels alternated between constipation and diarrhea in the last two weeks of treatment but reports this was not significantly bothersome.  Plan: The patient has completed radiation treatment. He will return to radiation oncology clinic for routine followup in one month. I advised him to call or return sooner if he has any questions or concerns related to his recovery or treatment. ________________________________  Sheral Apley. Tammi Klippel, M.D.  This document serves as a record of services personally performed by Tyler Pita, MD. It was created on his behalf by Rae Lips, a trained medical scribe. The creation of this record is based on the scribe's personal observations and the provider's statements to them. This document has been checked and approved by the attending provider.

## 2018-02-22 ENCOUNTER — Ambulatory Visit (INDEPENDENT_AMBULATORY_CARE_PROVIDER_SITE_OTHER): Payer: Medicare Other | Admitting: Family

## 2018-02-22 ENCOUNTER — Encounter: Payer: Self-pay | Admitting: Family

## 2018-02-22 ENCOUNTER — Other Ambulatory Visit (INDEPENDENT_AMBULATORY_CARE_PROVIDER_SITE_OTHER): Payer: Medicare Other

## 2018-02-22 VITALS — BP 138/70 | HR 83 | Temp 98.1°F | Ht 66.0 in | Wt 249.1 lb

## 2018-02-22 DIAGNOSIS — E538 Deficiency of other specified B group vitamins: Secondary | ICD-10-CM | POA: Diagnosis not present

## 2018-02-22 DIAGNOSIS — Z13220 Encounter for screening for lipoid disorders: Secondary | ICD-10-CM

## 2018-02-22 DIAGNOSIS — E559 Vitamin D deficiency, unspecified: Secondary | ICD-10-CM

## 2018-02-22 DIAGNOSIS — M791 Myalgia, unspecified site: Secondary | ICD-10-CM

## 2018-02-22 DIAGNOSIS — I10 Essential (primary) hypertension: Secondary | ICD-10-CM | POA: Diagnosis not present

## 2018-02-22 DIAGNOSIS — Z23 Encounter for immunization: Secondary | ICD-10-CM

## 2018-02-22 LAB — LIPID PANEL
CHOLESTEROL: 141 mg/dL (ref 0–200)
HDL: 41.1 mg/dL (ref >39.00)
LDL CALC: 69 mg/dL (ref 0–99)
NonHDL: 100.22
Total CHOL/HDL Ratio: 3
Triglycerides: 158 mg/dL — ABNORMAL HIGH (ref 0.0–149.0)
VLDL: 31.6 mg/dL (ref 0.0–40.0)

## 2018-02-22 LAB — CBC WITH DIFFERENTIAL/PLATELET
BASOS ABS: 0.1 10*3/uL (ref 0.0–0.1)
Basophils Relative: 1.2 % (ref 0.0–3.0)
EOS ABS: 0.4 10*3/uL (ref 0.0–0.7)
Eosinophils Relative: 6.4 % — ABNORMAL HIGH (ref 0.0–5.0)
HCT: 43 % (ref 39.0–52.0)
Hemoglobin: 14.7 g/dL (ref 13.0–17.0)
LYMPHS ABS: 0.6 10*3/uL — AB (ref 0.7–4.0)
Lymphocytes Relative: 10 % — ABNORMAL LOW (ref 12.0–46.0)
MCHC: 34.2 g/dL (ref 30.0–36.0)
MCV: 87.6 fl (ref 78.0–100.0)
MONO ABS: 0.6 10*3/uL (ref 0.1–1.0)
MONOS PCT: 10.1 % (ref 3.0–12.0)
NEUTROS PCT: 72.3 % (ref 43.0–77.0)
Neutro Abs: 4.5 10*3/uL (ref 1.4–7.7)
PLATELETS: 259 10*3/uL (ref 150.0–400.0)
RBC: 4.9 Mil/uL (ref 4.22–5.81)
RDW: 14.1 % (ref 11.5–15.5)
WBC: 6.2 10*3/uL (ref 4.0–10.5)

## 2018-02-22 LAB — COMPREHENSIVE METABOLIC PANEL
ALBUMIN: 4.3 g/dL (ref 3.5–5.2)
ALT: 25 U/L (ref 0–53)
AST: 17 U/L (ref 0–37)
Alkaline Phosphatase: 72 U/L (ref 39–117)
BILIRUBIN TOTAL: 0.6 mg/dL (ref 0.2–1.2)
BUN: 18 mg/dL (ref 6–23)
CO2: 32 mEq/L (ref 19–32)
CREATININE: 1.09 mg/dL (ref 0.40–1.50)
Calcium: 9.5 mg/dL (ref 8.4–10.5)
Chloride: 100 mEq/L (ref 96–112)
GFR: 71.54 mL/min (ref >60.00)
GLUCOSE: 108 mg/dL — AB (ref 70–99)
Potassium: 4.2 mEq/L (ref 3.5–5.1)
SODIUM: 140 meq/L (ref 135–145)
Total Protein: 7.2 g/dL (ref 6.0–8.3)

## 2018-02-22 LAB — VITAMIN D 25 HYDROXY (VIT D DEFICIENCY, FRACTURES): VITD: 31.57 ng/mL (ref 30.00–100.00)

## 2018-02-22 LAB — VITAMIN B12: Vitamin B-12: >1500 pg/mL — ABNORMAL HIGH (ref 211–911)

## 2018-02-22 NOTE — Patient Instructions (Signed)

## 2018-02-22 NOTE — Progress Notes (Signed)
Luis Knapp is a 67 y.o. male with the following history as recorded in EpicCare:  Patient Active Problem List   Diagnosis Date Noted  . Rash 04/17/2017  . Prostate cancer ([place]) 11/01/2016  . Medicare annual wellness visit, initial 10/18/2015  . Hydrocele, right 06/24/2013  . Routine general medical examination at a health care facility 12/04/2010  . OBESITY, CLASS II 05/18/2010  . SCIATICA 05/18/2010  . POLYP, COLON 08/03/2007  . Essential hypertension 08/03/2007  . DIVERTICULOSIS, COLON 08/03/2007    Current Outpatient Medications  Medication Sig Dispense Refill  . aspirin EC 81 MG tablet Take 81 mg by mouth daily.    . calcium carbonate (TUMS - DOSED IN MG ELEMENTAL CALCIUM) 500 MG chewable tablet Chew 1 tablet by mouth 3 (three) times daily as needed for indigestion or heartburn.     . Cholecalciferol (VITAMIN D-3) 1000 units CAPS Take 1 capsule by mouth daily.    Marland Kitchen ibuprofen (ADVIL,MOTRIN) 200 MG tablet Take 400 mg by mouth every 6 (six) hours as needed.    Marland Kitchen losartan-hydrochlorothiazide (HYZAAR) 100-25 MG tablet Take 1 tablet by mouth daily. 30 tablet 1  . Multiple Vitamins-Minerals (MULTIVITAMIN ADULT PO) Take 1 tablet by mouth daily.    . sildenafil (REVATIO) 20 MG tablet Take 20 mg by mouth daily.     . vitamin B-12 (CYANOCOBALAMIN) 250 MCG tablet Take 250 mcg by mouth daily.     No current facility-administered medications for this visit.     Allergies: Patient has no known allergies.  Past Medical History:  Diagnosis Date  . Allergic rhinitis due to pollen   . Allergy   . Diverticulosis of colon (without mention of hemorrhage)   . GERD (gastroesophageal reflux disease)   . History of colon polyps    2008--  PRE CANEROUS/   2013-- ADENOMA  . Prostate cancer ([place]) 11/01/2016   prostate cancer  . [name]   . Unspecified essential hypertension   . Wears glasses     Past Surgical History:  Procedure Laterality Date  . COLONOSCOPY    . COLONOSCOPY WITH  PROPOFOL  10-30-2011  &  2008   POLYPECTOMY  . HYDROCELE EXCISION Right 07/18/2013   Procedure: RIGHT HYDROCELECTOMY ADULT;  Surgeon: Ardis Hughs, MD;  Location: [facility];  Service: Urology;  Laterality: Right;  . LYMPH NODE DISSECTION Bilateral 11/01/2016   Procedure: BILATERAL PELVIC LYMPH NODE DISSECTION;  Surgeon: Ardis Hughs, MD;  Location: [facility];  Service: Urology;  Laterality: Bilateral;  . POLYPECTOMY    . ROBOT ASSISTED LAPAROSCOPIC RADICAL PROSTATECTOMY N/A 11/01/2016   Procedure: XI ROBOTIC ASSISTED LAPAROSCOPIC RADICAL PROSTATECTOMY;  Surgeon: Ardis Hughs, MD;  Location: [facility];  Service: Urology;  Laterality: N/A;    Family History  Problem Relation Age of Onset  . Coronary artery disease Father   . Heart attack Father        2nd one fatal  . Pulmonary embolism Father        ?  Marland Kitchen Dementia Father   . Heart disease Father   . Dementia Mother   . Hypertension Mother   . Macular degeneration Mother   . Kidney disease Mother   . Diabetes Sister   . Non-Hodgkin's lymphoma Sister   . Colon cancer Maternal Grandfather   . Prostate cancer Neg Hx   . Rectal cancer Neg Hx   . Colon polyps Neg Hx   . Esophageal cancer Neg Hx     Social  History   Tobacco Use  . Smoking status: Former Smoker    Packs/day: 3.00    Years: 25.00    Pack years: 75.00    Types: Cigarettes    Last attempt to quit: 10/16/1996    Years since quitting: 21.3  . Smokeless tobacco: Never Used  Substance Use Topics  . Alcohol use: No    Subjective:  6 week follow-up on hypertension; at last visit, medication was increased to Losartan HCT; Denies any chest pain, shortness of breath, blurred vision or headache. Not sure if his blood pressure is still slightly elevated due to reaction to radiation treatments; Has recently completed radiation for prostate cancer- felt that he began noticing more side effects within the last 2 weeks of the treatment; has been having  some increased diarrhea- known to be side effect of the radiation; "just more sensitive" to certain things than normal; Would like to get Tdap today; would like to get fasting labs today;     Objective:  Vitals:   02/22/18 1046  BP: 138/70  Pulse: 83  Temp: 98.1 F (36.7 C)  TempSrc: Oral  SpO2: 97%  Weight: 249 lb 1.3 oz (113 kg)  Height: '5\' 6"'  (1.676 m)    General: Well developed, well nourished, in no acute distress  Skin : Warm and dry.  Head: Normocephalic and atraumatic  Eyes: Sclera and conjunctiva clear; pupils round and reactive to light; extraocular movements intact  Ears: External normal; canals clear; tympanic membranes normal  Oropharynx: Pink, supple. No suspicious lesions  Neck: Supple without thyromegaly, adenopathy  Lungs: Respirations unlabored; clear to auscultation bilaterally without wheeze, rales, rhonchi  CVS exam: normal rate and regular rhythm.  Abdomen: Soft; nontender; nondistended; normoactive bowel sounds; no masses or hepatosplenomegaly  Musculoskeletal: No deformities; no active joint inflammation  Neurologic: Alert and oriented; speech intact; face symmetrical; moves all extremities well; CNII-XII intact without focal deficit   Assessment:  1. Essential hypertension   2. Lipid screening   3. Myalgia   4. B12 deficiency   5. Vitamin D deficiency   6. Need for diphtheria-tetanus-pertussis (Tdap) vaccine     Plan:  1. ? Control; improved compared to last OV but still not at goal; may be due to recent illness associated with completing radiation; have asked him to check his pressure daily for next 7-10 days and forward numbers for review; may need to consider adding second medication; update CBC, CMP today; 2. Update lipid panel today; Check Vitamin D and B12 today; Tdap updated;    No follow-ups on file.  Orders Placed This Encounter  Procedures  . Tdap vaccine greater than or equal to 7yo IM  . CBC w/Diff    Standing Status:   Future     Number of Occurrences:   1    Standing Expiration Date:   02/22/2019  . Comp Met (CMET)    Standing Status:   Future    Number of Occurrences:   1    Standing Expiration Date:   02/22/2019  . Lipid panel    Standing Status:   Future    Number of Occurrences:   1    Standing Expiration Date:   02/23/2019  . Vitamin D (25 hydroxy)    Standing Status:   Future    Number of Occurrences:   1    Standing Expiration Date:   02/22/2019  . B12    Standing Status:   Future    Number of Occurrences:  1    Standing Expiration Date:   02/22/2019    Requested Prescriptions    No prescriptions requested or ordered in this encounter

## 2018-03-01 ENCOUNTER — Other Ambulatory Visit: Payer: Self-pay | Admitting: Family

## 2018-03-01 MED ORDER — LOSARTAN POTASSIUM-HCTZ 100-25 MG PO TABS: 1.0000 | ORAL_TABLET | Freq: Every day | ORAL | 0 refills | Status: DC

## 2018-03-20 ENCOUNTER — Other Ambulatory Visit: Payer: Self-pay

## 2018-03-20 ENCOUNTER — Encounter: Payer: Self-pay | Admitting: Urology

## 2018-03-20 ENCOUNTER — Ambulatory Visit
Admission: RE | Admit: 2018-03-20 | Discharge: 2018-03-20 | Disposition: A | Discharge status: Home / Self Care | Payer: Medicare Other | Source: Ambulatory Visit | Attending: Urology | Admitting: Urology

## 2018-03-20 VITALS — BP 143/80 | HR 81 | Temp 97.8°F | Resp 18 | Ht 66.0 in | Wt 252.0 lb

## 2018-03-20 DIAGNOSIS — Z79899 Other long term (current) drug therapy: Secondary | ICD-10-CM | POA: Insufficient documentation

## 2018-03-20 DIAGNOSIS — C61 Malignant neoplasm of prostate: Secondary | ICD-10-CM | POA: Diagnosis not present

## 2018-03-20 DIAGNOSIS — Z923 Personal history of irradiation: Secondary | ICD-10-CM | POA: Insufficient documentation

## 2018-03-20 DIAGNOSIS — Z7982 Long term (current) use of aspirin: Secondary | ICD-10-CM | POA: Insufficient documentation

## 2018-03-20 NOTE — Progress Notes (Signed)
Radiation Oncology         [phone] ________________________________  Name: Rodderick Holtzer MRN: [id number]  Date: 03/20/2018  DOB: 06-Aug-1950  Post Treatment Note  CC: Marrian Salvage, FNP  Ardis Hughs, MD  Diagnosis:   67 y.o. 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 He tolerated radiation treatment relatively well.  He experienced mild fatigue and very minor urinary irritation with nocturia x1-2. His bowels alternated between constipation and diarrhea in the last two weeks of treatment but reports this was not significantly bothersome.                             On review of systems, the patient states that he is doing very well overall.  His lower urinary tract symptoms and bowel issues have returned back to his baseline at this point.  His current IPSS score is 2 with mildly increased frequency and nocturia x1.  He specifically denies gross hematuria, dysuria, straining to void, incomplete emptying or incontinence.  He is very pleased with his progress to date.  He denies abdominal pain, nausea, vomiting, diarrhea or constipation.  He reports a healthy appetite and is maintaining his weight.  He has not noticed any significant impact on his energy level and has remained active throughout treatment and beyond.  ALLERGIES:  has No Known Allergies.  Meds: Current Outpatient Medications  Medication Sig Dispense Refill  . aspirin EC 81 MG tablet Take 81 mg by mouth daily.    . calcium carbonate (TUMS - DOSED IN MG ELEMENTAL CALCIUM) 500 MG chewable tablet Chew 1 tablet by mouth 3 (three) times daily as needed for indigestion or heartburn.     . Cholecalciferol  (VITAMIN D-3) 1000 units CAPS Take 1 capsule by mouth daily.    Marland Kitchen losartan-hydrochlorothiazide (HYZAAR) 100-25 MG tablet Take 1 tablet by mouth daily. 30 tablet 0  . Multiple Vitamins-Minerals (MULTIVITAMIN ADULT PO) Take 1 tablet by mouth daily.    . sildenafil (REVATIO) 20 MG tablet Take 20 mg by mouth daily.     . vitamin B-12 (CYANOCOBALAMIN) 250 MCG tablet Take 250 mcg by mouth daily.    Marland Kitchen ibuprofen (ADVIL,MOTRIN) 200 MG tablet Take 400 mg by mouth every 6 (six) hours as needed.     No current facility-administered medications for this encounter.     Physical Findings:  height is 5\' 6"  (1.676 m) and weight is 252 lb (114.3 kg). His oral temperature is 97.8 F (36.6 C). His blood pressure is 143/80 (abnormal) and his pulse is 81. His respiration is 18 and oxygen saturation is 98%.  Pain Assessment Pain Score: 0-No pain/10 In general this is a well appearing Caucasian male in no acute distress.  He's alert and oriented x4 and appropriate throughout the examination. Cardiopulmonary assessment is negative for acute distress and he exhibits normal effort.   Lab Findings: Lab Results  Component Value Date   WBC 6.2 02/22/2018   HGB 14.7 02/22/2018   HCT 43.0 02/22/2018   MCV 87.6 02/22/2018   PLT 259.0 02/22/2018     Radiographic Findings: No results found.  Impression/Plan: 1. 67 y.o. 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 He will continue to follow up with urology for ongoing PSA determinations but does not currently have a follow-up as you pretty made appointment scheduled with Dr. [name].  He is advised to call [facility] to schedule a follow-up visit for early Jan. 2020 if he has not heard from Dr. Carlton Adam office by the end of this week.  He understands what to expect with regards to PSA monitoring going forward. I will look forward to following his response to  treatment via correspondence with urology, and would be happy to continue to participate in his care if clinically indicated. I talked to the patient about what to expect in the future, including his risk for erectile dysfunction and rectal bleeding. I encouraged him to call or return to the office if he has any questions regarding his previous radiation or possible radiation side effects. He was comfortable with this plan and will follow up as needed.    Nicholos Johns, PA-C

## 2018-03-20 NOTE — Addendum Note (Signed)
Encounter addended by: Malena Edman, RN on: 03/20/2018 10:46 AM  Actions taken: Visit diagnoses modified

## 2018-03-21 ENCOUNTER — Ambulatory Visit: Payer: Self-pay | Admitting: Urology

## 2018-04-12 ENCOUNTER — Other Ambulatory Visit: Payer: Self-pay | Admitting: Family

## 2018-05-06 ENCOUNTER — Other Ambulatory Visit: Payer: Self-pay | Admitting: Family

## 2018-06-13 ENCOUNTER — Other Ambulatory Visit: Payer: Self-pay | Admitting: Family

## 2018-06-27 DIAGNOSIS — Z8546 Personal history of malignant neoplasm of prostate: Secondary | ICD-10-CM | POA: Diagnosis not present

## 2018-06-27 DIAGNOSIS — N5231 Erectile dysfunction following radical prostatectomy: Secondary | ICD-10-CM | POA: Diagnosis not present

## 2018-07-09 ENCOUNTER — Other Ambulatory Visit: Payer: Self-pay | Admitting: Family

## 2018-07-09 MED ORDER — LOSARTAN POTASSIUM 100 MG PO TABS: 100.0000 mg | ORAL_TABLET | Freq: Every day | ORAL | 0 refills | Status: DC

## 2018-07-09 MED ORDER — HYDROCHLOROTHIAZIDE 25 MG PO TABS: 25.0000 mg | ORAL_TABLET | Freq: Every day | ORAL | 0 refills | Status: DC

## 2018-07-21 IMAGING — CR DG ABDOMEN 2V
3 series · 3 of 3 positions shown · non-contrast
Comparison: None.

CLINICAL DATA: Prostatectomy 10 days ago. Complaining of abdominal
distention, constipation and anuria 2 days.

EXAM:
ABDOMEN - 2 VIEW

[w abdomen upright]
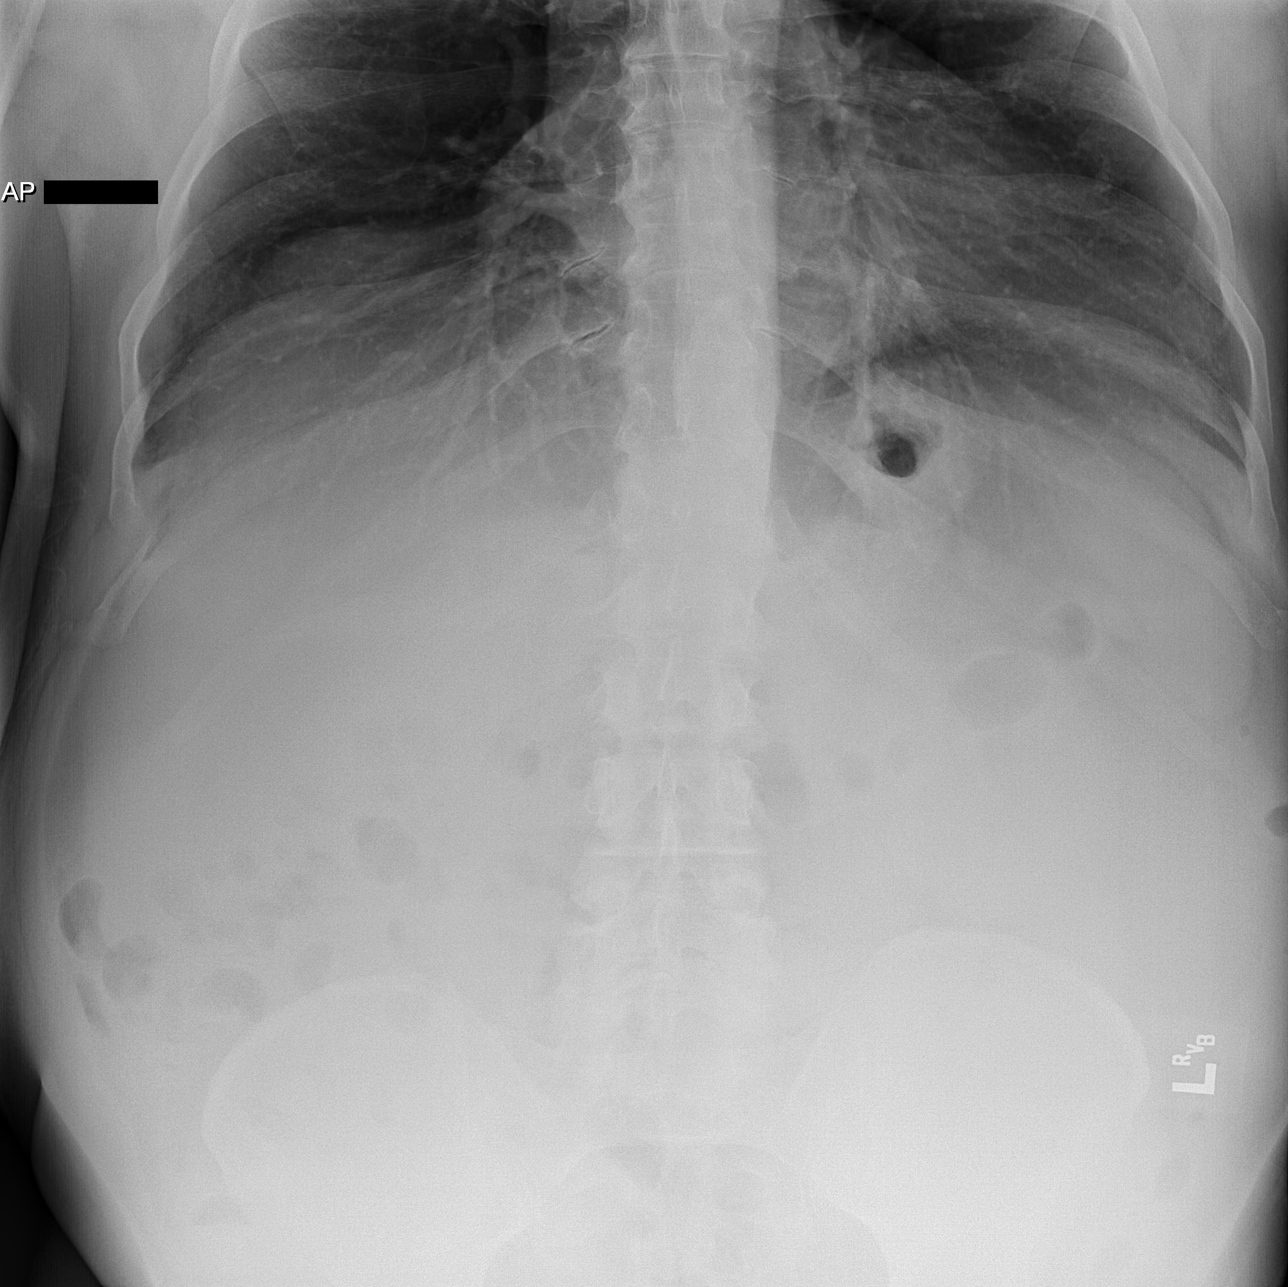

[t abdomen supine (1 of 2)]
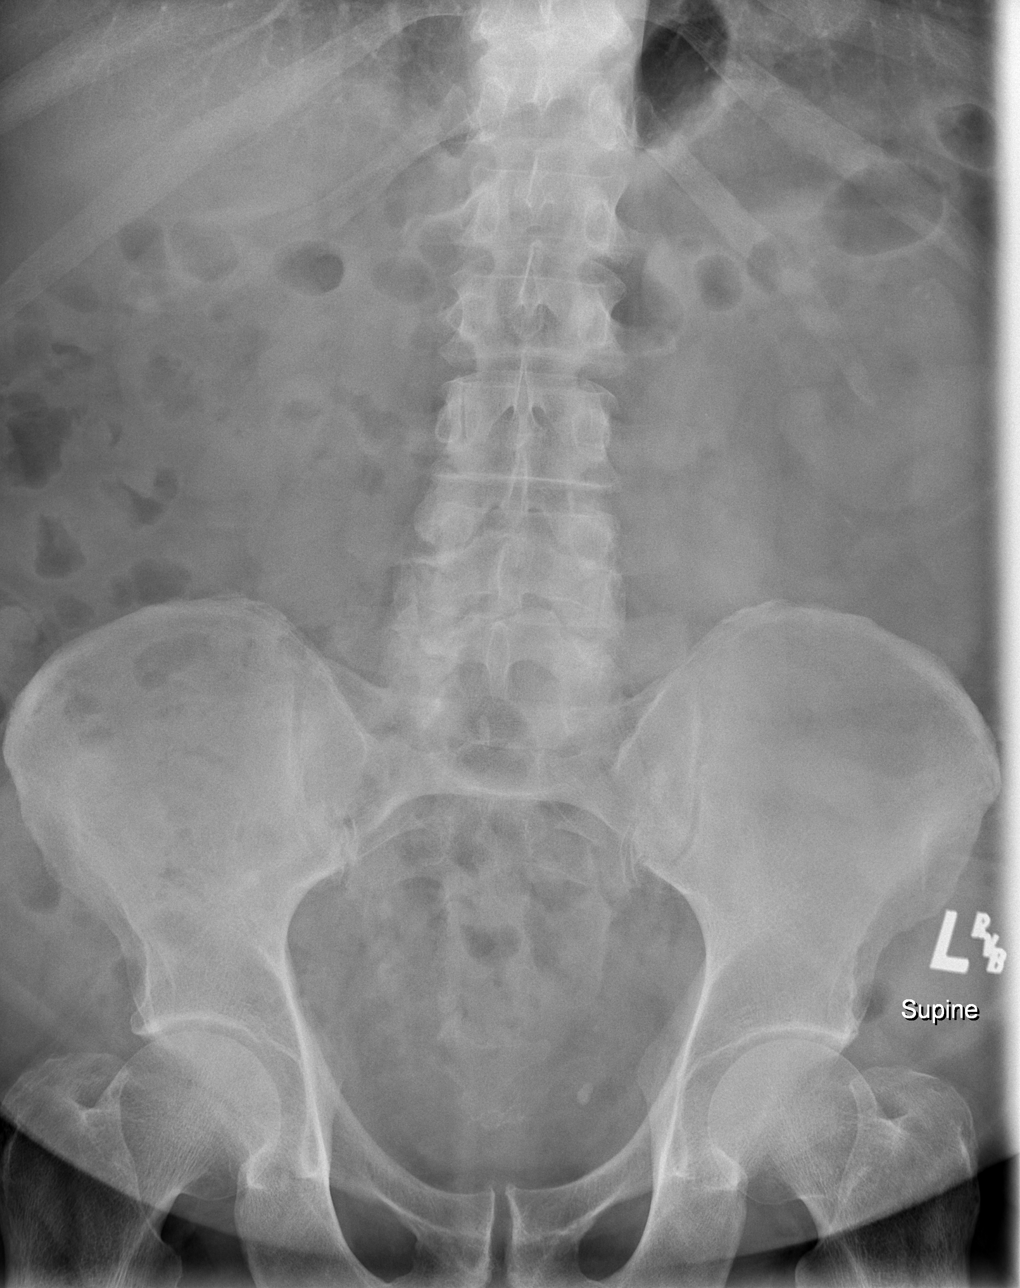

[t abdomen supine (2 of 2)]
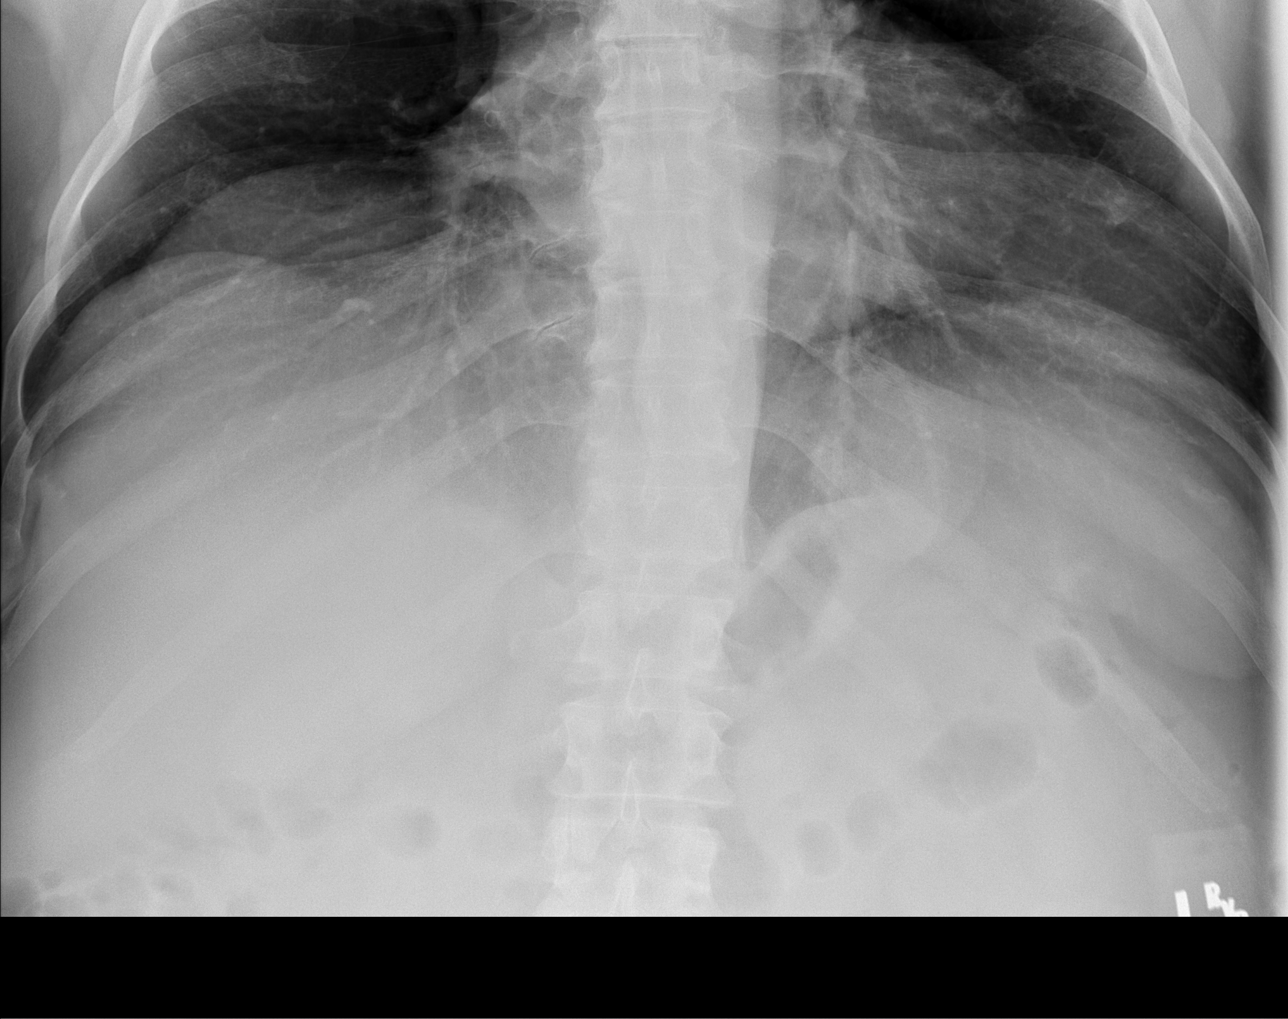

[3 of 3 positions shown; findings below may reference images not displayed]

FINDINGS: Normal bowel gas pattern. No free air. No significant increase in
stool.

No evidence of renal or ureteral stones. Soft tissues are
unremarkable.

Lung bases are clear.

No significant skeletal abnormality.
IMPRESSION: Negative.

## 2018-07-22 NOTE — Progress Notes (Addendum)
Subjective:   Luis Knapp is a 68 y.o. male who presents for Medicare Annual/Subsequent preventive examination.  Review of Systems:  No ROS.  Medicare Wellness Visit. Additional risk factors are reflected in the social history.  Cardiac Risk Factors include: advanced age (>4men, >50 women);male gender;hypertension Sleep patterns: awakens early, gets up 1-2 times nightly to void and sleeps 5-6 hours nightly.    Home Safety/Smoke Alarms: Feels safe in home. Smoke alarms in place.  Living environment; residence and Firearm Safety: 1-story house/ trailer Lives with wife, no needs for DME, good support system. Seat Belt Safety/Bike Helmet: Wears seat belt.      Objective:    Vitals: BP (!) 154/79   Pulse 85   Resp 16   Ht 5\' 6"  (1.676 m)   Wt 260 lb (117.9 kg)   SpO2 98%   BMI 41.97 kg/m   Body mass index is 41.97 kg/m.  Advanced Directives 03/20/2018 03/13/2017 11/11/2016 11/01/2016 10/30/2016 07/18/2013  Does Patient Have a Medical Advance Directive? Yes Yes Yes No No Patient does not have advance directive;Patient would not like information  Type of Scientist, forensic Power of [place];Living will [place];Living will [place];Living will - - -  Copy of [place] in Chart? - - No - copy requested - - -  Would patient like information on creating a medical advance directive? - - - No - Patient declined No - Patient declined -    Tobacco Social History   Tobacco Use  Smoking Status Former Smoker  . Packs/day: 3.00  . Years: 25.00  . Pack years: 75.00  . Types: Cigarettes  . Last attempt to quit: 10/16/1996  . Years since quitting: 21.7  Smokeless Tobacco Never Used     Counseling given: Not Answered  Past Medical History:  Diagnosis Date  . Allergic rhinitis due to pollen   . Allergy   . Diverticulosis of colon (without mention of hemorrhage)   . GERD (gastroesophageal reflux disease)   . History  of colon polyps    2008--  PRE CANEROUS/   2013-- ADENOMA  . Prostate cancer ([place]) 11/01/2016   prostate cancer  . [name]   . Unspecified essential hypertension   . Wears glasses    Past Surgical History:  Procedure Laterality Date  . COLONOSCOPY    . COLONOSCOPY WITH PROPOFOL  10-30-2011  &  2008   POLYPECTOMY  . HYDROCELE EXCISION Right 07/18/2013   Procedure: RIGHT HYDROCELECTOMY ADULT;  Surgeon: Ardis Hughs, MD;  Location[facility];  Service: Urology;  Laterality: Right;  . LYMPH NODE DISSECTION Bilateral 11/01/2016   Procedure: BILATERAL PELVIC LYMPH NODE DISSECTION;  Surgeon: Ardis Hughs, MD;  Location: [facility];  Service: Urology;  Laterality: Bilateral;  . POLYPECTOMY    . ROBOT ASSISTED LAPAROSCOPIC RADICAL PROSTATECTOMY N/A 11/01/2016   Procedure: XI ROBOTIC ASSISTED LAPAROSCOPIC RADICAL PROSTATECTOMY;  Surgeon: Ardis Hughs, MD;  Location: [facility];  Service: Urology;  Laterality: N/A;   Family History  Problem Relation Age of Onset  . Coronary artery disease Father   . Heart attack Father        2nd one fatal  . Pulmonary embolism Father        ?  Marland Kitchen Dementia Father   . Heart disease Father   . Dementia Mother   . Hypertension Mother   . Macular degeneration Mother   . Kidney disease Mother   .  Diabetes Sister   . Non-Hodgkin's lymphoma Sister   . Colon cancer Maternal Grandfather   . Prostate cancer Neg Hx   . Rectal cancer Neg Hx   . Colon polyps Neg Hx   . Esophageal cancer Neg Hx    Social History   Socioeconomic History  . Marital status: Married    Spouse name: Not on file  . Number of children: 2  . Years of education: Not on file  . Highest education level: Not on file  Occupational History  . 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 Financial resource strain:  Not hard at all  . Food insecurity:    Worry: Never true    Inability: Never true  . Transportation needs:    Medical: No    Non-medical: No  Tobacco Use  . Smoking status: Former Smoker    Packs/day: 3.00    Years: 25.00    Pack years: 75.00    Types: Cigarettes    Last attempt to quit: 10/16/1996    Years since quitting: 21.7  . Smokeless tobacco: Never Used  Substance and Sexual Activity  . Alcohol use: No  . Drug use: No  . Sexual activity: Yes    Comment: with aid of SIldenafil and VED  Lifestyle  . Physical activity:    Days per week: 0 days    Minutes per session: 0 min  . Stress: Not at all  Relationships  . Social connections:    Talks on phone: More than three times a week    Gets together: More than three times a week    Attends religious service: More than 4 times per year    Active member of club or organization: Yes    Attends meetings of clubs or organizations: More than 4 times per year    Relationship status: Married  Other Topics Concern  . Not on file  Social History Narrative   Married '77- 19 years divorced; married '02. 2 sons - '79, '80; 1 grandson. Things are ok. Work is OK - Dance movement psychotherapist.    Fun/Hobby: Works at [employer] on a regular basis    Outpatient Encounter Medications as of 07/23/2018  Medication Sig  . aspirin EC 81 MG tablet Take 81 mg by mouth daily.  . calcium carbonate (TUMS - DOSED IN MG ELEMENTAL CALCIUM) 500 MG chewable tablet Chew 1 tablet by mouth 3 (three) times daily as needed for indigestion or heartburn.   . Cholecalciferol (VITAMIN D-3) 1000 units CAPS Take 1 capsule by mouth daily.  . hydrochlorothiazide (HYDRODIURIL) 25 MG tablet Take 1 tablet (25 mg total) by mouth daily.  Marland Kitchen ibuprofen (ADVIL,MOTRIN) 200 MG tablet Take 400 mg by mouth every 6 (six) hours as needed.  Marland Kitchen losartan (COZAAR) 100 MG tablet Take 1 tablet (100 mg total) by mouth daily.  . Multiple Vitamins-Minerals (MULTIVITAMIN ADULT PO) Take 1 tablet by  mouth daily.  . vitamin B-12 (CYANOCOBALAMIN) 250 MCG tablet Take 250 mcg by mouth daily.  . sildenafil (REVATIO) 20 MG tablet Take 20 mg by mouth daily.    No facility-administered encounter medications on file as of 07/23/2018.     Activities of Daily Living In your present state of health, do you have any difficulty performing the following activities: 07/23/2018  Hearing? N  Vision? N  Difficulty concentrating or making decisions? N  Walking or climbing stairs? N  Dressing or bathing? N  Doing errands, shopping? N  Preparing Food and eating ? N  Using the Toilet? N  In the past six months, have you accidently leaked urine? N  Do you have problems with loss of bowel control? N  Managing your Medications? N  Managing your Finances? N  Housekeeping or managing your Housekeeping? N  Some recent data might be hidden    Patient Care Team: Marrian Salvage, [place] as PCP - General (Internal Medicine) Ardis Hughs, MD as Attending Physician (Urology) Tyler Pita, MD as Consulting Physician (Radiation Oncology)   Assessment:   This is a routine wellness examination for [place]. Physical assessment deferred to PCP.  Exercise Activities and Dietary recommendations Current Exercise Habits: The patient does not participate in regular exercise at present, Exercise limited by: None identified  Diet (meal preparation, eat out, water intake, caffeinated beverages, dairy products, fruits and vegetables): in general, a "healthy" diet  , well balanced   Reviewed heart healthy diet. Encouraged patient to increase daily water and healthy fluid intake. Discussed weight loss tips and diet. Relevant patient education assigned to patient using Emmi.  Goals    . Patient Stated     Lose weight by monitoring my diet more closely and increase my physical activity.       Fall Risk Fall Risk  02/22/2018 01/17/2017 10/18/2015  Falls in the past year? No No No   Depression Screen PHQ 2/9  Scores 01/17/2017 10/18/2015  PHQ - 2 Score 0 0    Cognitive Function       Ad8 score reviewed for issues:  Issues making decisions: no  Less interest in hobbies / activities: no  Repeats questions, stories (family complaining): no  Trouble using ordinary gadgets (microwave, computer, phone):no  Forgets the month or year: no  Mismanaging finances: no  Remembering appts: no  Daily problems with thinking and/or memory: no Ad8 score is= 0  Immunization History  Administered Date(s) Administered  . Influenza, High Dose Seasonal PF 01/17/2017  . Pneumococcal Conjugate-13 06/23/2013  . Pneumococcal Polysaccharide-23 03/20/2012, 07/23/2018  . Td 09/20/2007  . Tdap 02/22/2018   Screening Tests Health Maintenance  Topic Date Due  . INFLUENZA VACCINE  08/14/2018 (Originally 12/13/2017)  . COLONOSCOPY  03/27/2020  . TETANUS/TDAP  02/23/2028  . Hepatitis C Screening  Completed  . PNA vac Low Risk Adult  Completed      Plan:     Reviewed health maintenance screenings with patient today and relevant education, vaccines, and/or referrals were provided.   Continue doing brain stimulating activities (puzzles, reading, adult coloring books, staying active) to keep memory sharp.   Continue to eat heart healthy diet (full of fruits, vegetables, whole grains, lean protein, water--limit salt, fat, and sugar intake) and increase physical activity as tolerated.  I have personally reviewed and noted the following in the patient's chart:   . Medical and social history . Use of alcohol, tobacco or illicit drugs  . Current medications and supplements . Functional ability and status . Nutritional status . Physical activity . Advanced directives . List of other physicians . Vitals . Screenings to include cognitive, depression, and falls . Referrals and appointments  In addition, I have reviewed and discussed with patient certain preventive protocols, quality metrics, and best practice  recommendations. A written personalized care plan for preventive services as well as general preventive health recommendations were provided to patient.  Michiel Cowboy, RN  07/23/2018  Medical screening examination/treatment/procedure(s) were performed by non-physician practitioner and as supervising provider I was immediately available for consultation/collaboration.  I agree with above. Marrian Salvage, FNP

## 2018-07-23 ENCOUNTER — Ambulatory Visit (INDEPENDENT_AMBULATORY_CARE_PROVIDER_SITE_OTHER): Payer: Medicare Other | Admitting: *Deleted

## 2018-07-23 ENCOUNTER — Telehealth: Payer: Self-pay | Admitting: *Deleted

## 2018-07-23 VITALS — BP 154/79 | HR 85 | Resp 16 | Ht 66.0 in | Wt 260.0 lb

## 2018-07-23 DIAGNOSIS — Z0000 Encounter for general adult medical examination without abnormal findings: Secondary | ICD-10-CM | POA: Diagnosis not present

## 2018-07-23 DIAGNOSIS — Z23 Encounter for immunization: Secondary | ICD-10-CM

## 2018-07-23 MED ORDER — LOSARTAN POTASSIUM-HCTZ 100-25 MG PO TABS: 1.0000 | ORAL_TABLET | Freq: Every day | ORAL | 1 refills | Status: DC

## 2018-07-23 NOTE — Telephone Encounter (Signed)
Spoke with patient and info given. He will call back to make appointment for follow up.

## 2018-07-23 NOTE — Patient Instructions (Signed)
Continue doing brain stimulating activities (puzzles, reading, adult coloring books, staying active) to keep memory sharp.   Continue to eat heart healthy diet (full of fruits, vegetables, whole grains, lean protein, water--limit salt, fat, and sugar intake) and increase physical activity as tolerated.   Luis Knapp , Thank you for taking time to come for your Medicare Wellness Visit. I appreciate your ongoing commitment to your health goals. Please review the following plan we discussed and let me know if I can assist you in the future.   These are the goals we discussed: Goals    . Patient Stated     Lose weight by monitoring my diet more closely and increase my physical activity.       This is a list of the screening recommended for you and due dates:  Health Maintenance  Topic Date Due  . Pneumonia vaccines (2 of 2 - PPSV23) 03/20/2017  . Flu Shot  08/14/2018*  . Colon Cancer Screening  03/27/2020  . Tetanus Vaccine  02/23/2028  .  Hepatitis C: One time screening is recommended by Center for Disease Control  (CDC) for  adults born from [birth year] through 1966.   Completed  *Topic was postponed. The date shown is not the original due date.    Preventive Care 30 Years and Older, Male Preventive care refers to lifestyle choices and visits with your health care provider that can promote health and wellness. What does preventive care include?   A yearly physical exam. This is also called an annual well check.  Dental exams once or twice a year.  Routine eye exams. Ask your health care provider how often you should have your eyes checked.  Personal lifestyle choices, including: ? Daily care of your teeth and gums. ? Regular physical activity. ? Eating a healthy diet. ? Avoiding tobacco and drug use. ? Limiting alcohol use. ? Practicing safe sex. ? Taking low doses of aspirin every day. ? Taking vitamin and mineral supplements as recommended by your health care provider. What  happens during an annual well check? The services and screenings done by your health care provider during your annual well check will depend on your age, overall health, lifestyle risk factors, and family history of disease. Counseling Your health care provider may ask you questions about your:  Alcohol use.  Tobacco use.  Drug use.  Emotional well-being.  Home and relationship well-being.  Sexual activity.  Eating habits.  History of falls.  Memory and ability to understand (cognition).  Work and work Statistician. Screening You may have the following tests or measurements:  Height, weight, and BMI.  Blood pressure.  Lipid and cholesterol levels. These may be checked every 5 years, or more frequently if you are over 71 years old.  Skin check.  Lung cancer screening. You may have this screening every year starting at age 51 if you have a 30-pack-year history of smoking and currently smoke or have quit within the past 15 years.  Colorectal cancer screening. All adults should have this screening starting at age 79 and continuing until age 15. You will have tests every 1-10 years, depending on your results and the type of screening test. People at increased risk should start screening at an earlier age. Screening tests may include: ? Guaiac-based fecal occult blood testing. ? Fecal immunochemical test (FIT). ? Stool DNA test. ? Virtual colonoscopy. ? Sigmoidoscopy. During this test, a flexible tube with a tiny camera (sigmoidoscope) is used to examine your rectum and  lower colon. The sigmoidoscope is inserted through your anus into your rectum and lower colon. ? Colonoscopy. During this test, a long, thin, flexible tube with a tiny camera (colonoscope) is used to examine your entire colon and rectum.  Prostate cancer screening. Recommendations will vary depending on your family history and other risks.  Hepatitis C blood test.  Hepatitis B blood test.  Sexually  transmitted disease (STD) testing.  Diabetes screening. This is done by checking your blood sugar (glucose) after you have not eaten for a while (fasting). You may have this done every 1-3 years.  Abdominal aortic aneurysm (AAA) screening. You may need this if you are a current or former smoker.  Osteoporosis. You may be screened starting at age 83 if you are at high risk. Talk with your health care provider about your test results, treatment options, and if necessary, the need for more tests. Vaccines Your health care provider may recommend certain vaccines, such as:  Influenza vaccine. This is recommended every year.  Tetanus, diphtheria, and acellular pertussis (Tdap, Td) vaccine. You may need a Td booster every 10 years.  Varicella vaccine. You may need this if you have not been vaccinated.  Zoster vaccine. You may need this after age 62.  Measles, mumps, and rubella (MMR) vaccine. You may need at least one dose of MMR if you were born in 1957 or later. You may also need a second dose.  Pneumococcal 13-valent conjugate (PCV13) vaccine. One dose is recommended after age 52.  Pneumococcal polysaccharide (PPSV23) vaccine. One dose is recommended after age 38.  Meningococcal vaccine. You may need this if you have certain conditions.  Hepatitis A vaccine. You may need this if you have certain conditions or if you travel or work in places where you may be exposed to hepatitis A.  Hepatitis B vaccine. You may need this if you have certain conditions or if you travel or work in places where you may be exposed to hepatitis B.  Haemophilus influenzae type b (Hib) vaccine. You may need this if you have certain risk factors. Talk to your health care provider about which screenings and vaccines you need and how often you need them. This information is not intended to replace advice given to you by your health care provider. Make sure you discuss any questions you have with your health care  provider. Document Released: 05/28/2015 Document Revised: 06/21/2017 Document Reviewed: 03/02/2015 Elsevier Interactive Patient Education  2019 [employer].

## 2018-07-23 NOTE — Telephone Encounter (Signed)
I will send in the Hyzaar for him as he is requesting. It looks like he needs to come back in to discuss his blood pressure though. Was not well controlled at his Medicare AWV.

## 2018-07-23 NOTE — Telephone Encounter (Signed)
During AWV, patient stated that his pharmacy notified him that losartan is now on back order and they do have the combination hyzar which was previously on back order. Nurse called to verify and [facility] does have the hyzar in stock and does not have losartan. Patient states he will soon be out of losartan and requested refill for hyzar.

## 2018-07-23 NOTE — Telephone Encounter (Signed)
error 

## 2018-07-26 ENCOUNTER — Encounter: Payer: Self-pay | Admitting: Family

## 2018-07-26 ENCOUNTER — Other Ambulatory Visit: Payer: Self-pay

## 2018-07-26 ENCOUNTER — Ambulatory Visit (INDEPENDENT_AMBULATORY_CARE_PROVIDER_SITE_OTHER): Payer: Medicare Other | Admitting: Family

## 2018-07-26 VITALS — BP 120/72 | HR 86 | Ht 66.0 in | Wt 258.0 lb

## 2018-07-26 DIAGNOSIS — I10 Essential (primary) hypertension: Secondary | ICD-10-CM

## 2018-07-26 NOTE — Patient Instructions (Signed)
DASH Eating Plan  DASH stands for "Dietary Approaches to Stop Hypertension." The DASH eating plan is a healthy eating plan that has been shown to reduce high blood pressure (hypertension). It may also reduce your risk for type 2 diabetes, heart disease, and stroke. The DASH eating plan may also help with weight loss.  What are tips for following this plan?    General guidelines   Avoid eating more than 2,300 mg (milligrams) of salt (sodium) a day. If you have hypertension, you may need to reduce your sodium intake to 1,500 mg a day.   Limit alcohol intake to no more than 1 drink a day for nonpregnant women and 2 drinks a day for men. One drink equals 12 oz of beer, 5 oz of wine, or 1 oz of hard liquor.   Work with your health care provider to maintain a healthy body weight or to lose weight. Ask what an ideal weight is for you.   Get at least 30 minutes of exercise that causes your heart to beat faster (aerobic exercise) most days of the week. Activities may include walking, swimming, or biking.   Work with your health care provider or diet and nutrition specialist (dietitian) to adjust your eating plan to your individual calorie needs.  Reading food labels     Check food labels for the amount of sodium per serving. Choose foods with less than 5 percent of the Daily Value of sodium. Generally, foods with less than 300 mg of sodium per serving fit into this eating plan.   To find whole grains, look for the word "whole" as the first word in the ingredient list.  Shopping   Buy products labeled as "low-sodium" or "no salt added."   Buy fresh foods. Avoid canned foods and premade or frozen meals.  Cooking   Avoid adding salt when cooking. Use salt-free seasonings or herbs instead of table salt or sea salt. Check with your health care provider or pharmacist before using salt substitutes.   Do not fry foods. Cook foods using healthy methods such as baking, boiling, grilling, and broiling instead.   Cook with  heart-healthy oils, such as olive, canola, soybean, or sunflower oil.  Meal planning   Eat a balanced diet that includes:  ? 5 or more servings of fruits and vegetables each day. At each meal, try to fill half of your plate with fruits and vegetables.  ? Up to 6-8 servings of whole grains each day.  ? Less than 6 oz of lean meat, poultry, or fish each day. A 3-oz serving of meat is about the same size as a deck of cards. One egg equals 1 oz.  ? 2 servings of low-fat dairy each day.  ? A serving of nuts, seeds, or beans 5 times each week.  ? Heart-healthy fats. Healthy fats called Omega-3 fatty acids are found in foods such as flaxseeds and coldwater fish, like sardines, salmon, and mackerel.   Limit how much you eat of the following:  ? Canned or prepackaged foods.  ? Food that is high in trans fat, such as fried foods.  ? Food that is high in saturated fat, such as fatty meat.  ? Sweets, desserts, sugary drinks, and other foods with added sugar.  ? Full-fat dairy products.   Do not salt foods before eating.   Try to eat at least 2 vegetarian meals each week.   Eat more home-cooked food and less restaurant, buffet, and fast food.     When eating at a restaurant, ask that your food be prepared with less salt or no salt, if possible.  What foods are recommended?  The items listed may not be a complete list. Talk with your dietitian about what dietary choices are best for you.  Grains  Whole-grain or whole-wheat bread. Whole-grain or whole-wheat pasta. Brown rice. Oatmeal. Quinoa. Bulgur. Whole-grain and low-sodium cereals. Pita bread. Low-fat, low-sodium crackers. Whole-wheat flour tortillas.  Vegetables  Fresh or frozen vegetables (raw, steamed, roasted, or grilled). Low-sodium or reduced-sodium tomato and vegetable juice. Low-sodium or reduced-sodium tomato sauce and tomato paste. Low-sodium or reduced-sodium canned vegetables.  Fruits  All fresh, dried, or frozen fruit. Canned fruit in natural juice (without  added sugar).  Meat and other protein foods  Skinless chicken or turkey. Ground chicken or turkey. Pork with fat trimmed off. Fish and seafood. Egg whites. Dried beans, peas, or lentils. Unsalted nuts, nut butters, and seeds. Unsalted canned beans. Lean cuts of beef with fat trimmed off. Low-sodium, lean deli meat.  Dairy  Low-fat (1%) or fat-free (skim) milk. Fat-free, low-fat, or reduced-fat cheeses. Nonfat, low-sodium ricotta or cottage cheese. Low-fat or nonfat yogurt. Low-fat, low-sodium cheese.  Fats and oils  Soft margarine without trans fats. Vegetable oil. Low-fat, reduced-fat, or light mayonnaise and salad dressings (reduced-sodium). Canola, safflower, olive, soybean, and sunflower oils. Avocado.  Seasoning and other foods  Herbs. Spices. Seasoning mixes without salt. Unsalted popcorn and pretzels. Fat-free sweets.  What foods are not recommended?  The items listed may not be a complete list. Talk with your dietitian about what dietary choices are best for you.  Grains  Baked goods made with fat, such as croissants, muffins, or some breads. Dry pasta or rice meal packs.  Vegetables  Creamed or fried vegetables. Vegetables in a cheese sauce. Regular canned vegetables (not low-sodium or reduced-sodium). Regular canned tomato sauce and paste (not low-sodium or reduced-sodium). Regular tomato and vegetable juice (not low-sodium or reduced-sodium). Pickles. Olives.  Fruits  Canned fruit in a light or heavy syrup. Fried fruit. Fruit in cream or butter sauce.  Meat and other protein foods  Fatty cuts of meat. Ribs. Fried meat. Bacon. Sausage. Bologna and other processed lunch meats. Salami. Fatback. Hotdogs. Bratwurst. Salted nuts and seeds. Canned beans with added salt. Canned or smoked fish. Whole eggs or egg yolks. Chicken or turkey with skin.  Dairy  Whole or 2% milk, cream, and half-and-half. Whole or full-fat cream cheese. Whole-fat or sweetened yogurt. Full-fat cheese. Nondairy creamers. Whipped toppings.  Processed cheese and cheese spreads.  Fats and oils  Butter. Stick margarine. Lard. Shortening. Ghee. Bacon fat. Tropical oils, such as coconut, palm kernel, or palm oil.  Seasoning and other foods  Salted popcorn and pretzels. Onion salt, garlic salt, seasoned salt, table salt, and sea salt. Worcestershire sauce. Tartar sauce. Barbecue sauce. Teriyaki sauce. Soy sauce, including reduced-sodium. Steak sauce. Canned and packaged gravies. Fish sauce. Oyster sauce. Cocktail sauce. Horseradish that you find on the shelf. Ketchup. Mustard. Meat flavorings and tenderizers. Bouillon cubes. Hot sauce and Tabasco sauce. Premade or packaged marinades. Premade or packaged taco seasonings. Relishes. Regular salad dressings.  Where to find more information:   National Heart, Lung, and Blood Institute: www.nhlbi.nih.gov   American Heart Association: www.heart.org  Summary   The DASH eating plan is a healthy eating plan that has been shown to reduce high blood pressure (hypertension). It may also reduce your risk for type 2 diabetes, heart disease, and stroke.   With the   DASH eating plan, you should limit salt (sodium) intake to 2,300 mg a day. If you have hypertension, you may need to reduce your sodium intake to 1,500 mg a day.   When on the DASH eating plan, aim to eat more fresh fruits and vegetables, whole grains, lean proteins, low-fat dairy, and heart-healthy fats.   Work with your health care provider or diet and nutrition specialist (dietitian) to adjust your eating plan to your individual calorie needs.  This information is not intended to replace advice given to you by your health care provider. Make sure you discuss any questions you have with your health care provider.  Document Released: 04/20/2011 Document Revised: 04/24/2016 Document Reviewed: 04/24/2016  Elsevier Interactive Patient Education  2019 [employer] Inc[employer]

## 2018-07-26 NOTE — Progress Notes (Signed)
Luis Knapp is a 68 y.o. male with the following history as recorded in Epic[employer]:  Patient Active Problem List   Diagnosis Date Noted  . Rash 04/17/2017  . Prostate cancer ([place]) 11/01/2016  . Medicare annual wellness visit, initial 10/18/2015  . Hydrocele, right 06/24/2013  . Routine general medical examination at a health care facility 12/04/2010  . OBESITY, CLASS II 05/18/2010  . SCIATICA 05/18/2010  . POLYP, COLON 08/03/2007  . Essential hypertension 08/03/2007  . DIVERTICULOSIS, COLON 08/03/2007    Current Outpatient Medications  Medication Sig Dispense Refill  . aspirin EC 81 MG tablet Take 81 mg by mouth daily.    . calcium carbonate (TUMS - DOSED IN MG ELEMENTAL CALCIUM) 500 MG chewable tablet Chew 1 tablet by mouth 3 (three) times daily as needed for indigestion or heartburn.     . Cholecalciferol (VITAMIN D-3) 1000 units CAPS Take 1 capsule by mouth daily.    Marland Kitchen ibuprofen (ADVIL,MOTRIN) 200 MG tablet Take 400 mg by mouth every 6 (six) hours as needed.    Marland Kitchen losartan-hydrochlorothiazide (HYZAAR) 100-25 MG tablet Take 1 tablet by mouth daily. 90 tablet 1  . Multiple Vitamins-Minerals (MULTIVITAMIN ADULT PO) Take 1 tablet by mouth daily.    . vitamin B-12 (CYANOCOBALAMIN) 250 MCG tablet Take 250 mcg by mouth daily.     No current facility-administered medications for this visit.     Allergies: Patient has no known allergies.  Past Medical History:  Diagnosis Date  . Allergic rhinitis due to pollen   . Allergy   . Diverticulosis of colon (without mention of hemorrhage)   . GERD (gastroesophageal reflux disease)   . History of colon polyps    2008--  PRE CANEROUS/   2013-- ADENOMA  . Prostate cancer ([place]) 11/01/2016   prostate cancer  . [name]   . Unspecified essential hypertension   . Wears glasses     Past Surgical History:  Procedure Laterality Date  . COLONOSCOPY    . COLONOSCOPY WITH PROPOFOL  10-30-2011  &  2008   POLYPECTOMY  . HYDROCELE EXCISION  Right 07/18/2013   Procedure: RIGHT HYDROCELECTOMY ADULT;  Surgeon: Ardis Hughs, MD;  Location: [facility];  Service: Urology;  Laterality: Right;  . LYMPH NODE DISSECTION Bilateral 11/01/2016   Procedure: BILATERAL PELVIC LYMPH NODE DISSECTION;  Surgeon: Ardis Hughs, MD;  Location: [facility];  Service: Urology;  Laterality: Bilateral;  . POLYPECTOMY    . ROBOT ASSISTED LAPAROSCOPIC RADICAL PROSTATECTOMY N/A 11/01/2016   Procedure: XI ROBOTIC ASSISTED LAPAROSCOPIC RADICAL PROSTATECTOMY;  Surgeon: Ardis Hughs, MD;  Location: [facility];  Service: Urology;  Laterality: N/A;    Family History  Problem Relation Age of Onset  . Coronary artery disease Father   . Heart attack Father        2nd one fatal  . Pulmonary embolism Father        ?  Marland Kitchen Dementia Father   . Heart disease Father   . Dementia Mother   . Hypertension Mother   . Macular degeneration Mother   . Kidney disease Mother   . Diabetes Sister   . Non-Hodgkin's lymphoma Sister   . Colon cancer Maternal Grandfather   . Prostate cancer Neg Hx   . Rectal cancer Neg Hx   . Colon polyps Neg Hx   . Esophageal cancer Neg Hx     Social History   Tobacco Use  . Smoking status: Former Smoker    Packs/day: 3.00  Years: 25.00    Pack years: 75.00    Types: Cigarettes    Last attempt to quit: 10/16/1996    Years since quitting: 21.7  . Smokeless tobacco: Never Used  Substance Use Topics  . Alcohol use: No    Subjective:   Patient presents for a follow-up on hypertension; was noted to be elevated at recent AWV; is currently on Losartan 100/25 daily; Denies any chest pain, shortness of breath, blurred vision or headache Accompanied by his wife- she notes they eat healthy diet/ whole foods/ try to limit salt intake   Objective:  Vitals:   07/26/18 1320  BP: 120/72  Pulse: 86  SpO2: 95%  Weight: 258 lb (117 kg)  Height: 5\' 6"  (1.676 m)    General: Well developed, well nourished, in no acute  distress  Skin : Warm and dry.  Head: Normocephalic and atraumatic  Lungs: Respirations unlabored; clear to auscultation bilaterally without wheeze, rales, rhonchi  [facility] exam: normal rate and regular rhythm.  Neurologic: Alert and oriented; speech intact; face symmetrical; moves all extremities well; CNII-XII intact without focal deficit   Assessment:  1. Essential hypertension     Plan:  Appears controlled today but historically has not been controlled; will have patient check pressure daily x 2 weeks and forward log for review; DASH diet and weight loss discussed; may need 3rd agent.   No follow-ups on file.  No orders of the defined types were placed in this encounter.   Requested Prescriptions    No prescriptions requested or ordered in this encounter

## 2018-08-10 ENCOUNTER — Other Ambulatory Visit: Payer: Self-pay | Admitting: Family

## 2018-08-12 ENCOUNTER — Encounter: Payer: Self-pay | Admitting: Family

## 2018-09-07 ENCOUNTER — Other Ambulatory Visit: Payer: Self-pay | Admitting: Family

## 2018-09-18 ENCOUNTER — Other Ambulatory Visit: Payer: Self-pay

## 2018-09-18 MED ORDER — LOSARTAN POTASSIUM-HCTZ 100-25 MG PO TABS: 1.0000 | ORAL_TABLET | Freq: Every day | ORAL | 1 refills | Status: DC

## 2018-12-25 DIAGNOSIS — Z8546 Personal history of malignant neoplasm of prostate: Secondary | ICD-10-CM | POA: Diagnosis not present

## 2018-12-31 DIAGNOSIS — N393 Stress incontinence (female) (male): Secondary | ICD-10-CM | POA: Diagnosis not present

## 2018-12-31 DIAGNOSIS — R9721 Rising PSA following treatment for malignant neoplasm of prostate: Secondary | ICD-10-CM | POA: Diagnosis not present

## 2018-12-31 DIAGNOSIS — C61 Malignant neoplasm of prostate: Secondary | ICD-10-CM | POA: Diagnosis not present

## 2019-04-15 ENCOUNTER — Other Ambulatory Visit: Payer: Self-pay | Admitting: Family

## 2019-07-01 DIAGNOSIS — Z8546 Personal history of malignant neoplasm of prostate: Secondary | ICD-10-CM | POA: Diagnosis not present

## 2019-07-08 DIAGNOSIS — N5231 Erectile dysfunction following radical prostatectomy: Secondary | ICD-10-CM | POA: Diagnosis not present

## 2019-07-08 DIAGNOSIS — R351 Nocturia: Secondary | ICD-10-CM | POA: Diagnosis not present

## 2019-07-09 ENCOUNTER — Other Ambulatory Visit: Payer: Self-pay | Admitting: Family

## 2019-07-09 MED ORDER — LOSARTAN POTASSIUM-HCTZ 100-25 MG PO TABS: 1.0000 | ORAL_TABLET | Freq: Every day | ORAL | 0 refills | Status: DC

## 2019-07-15 ENCOUNTER — Telehealth: Payer: Self-pay | Admitting: Family

## 2019-07-15 NOTE — Telephone Encounter (Signed)
Left message for patient to call office to reschedule AWV that is scheduled on 07/25/19 with Nurse Health Advisor. SF

## 2019-07-25 ENCOUNTER — Ambulatory Visit: Payer: Medicare Other

## 2019-08-19 ENCOUNTER — Encounter: Payer: Self-pay | Admitting: Family

## 2019-08-19 ENCOUNTER — Ambulatory Visit (INDEPENDENT_AMBULATORY_CARE_PROVIDER_SITE_OTHER): Payer: Medicare Other

## 2019-08-19 ENCOUNTER — Ambulatory Visit (INDEPENDENT_AMBULATORY_CARE_PROVIDER_SITE_OTHER): Payer: Medicare Other | Admitting: Family

## 2019-08-19 ENCOUNTER — Other Ambulatory Visit: Payer: Self-pay

## 2019-08-19 VITALS — BP 142/80 | HR 90 | Temp 98.2°F | Ht 66.0 in | Wt 246.4 lb

## 2019-08-19 DIAGNOSIS — M2550 Pain in unspecified joint: Secondary | ICD-10-CM | POA: Diagnosis not present

## 2019-08-19 DIAGNOSIS — M25561 Pain in right knee: Secondary | ICD-10-CM

## 2019-08-19 DIAGNOSIS — I10 Essential (primary) hypertension: Secondary | ICD-10-CM | POA: Diagnosis not present

## 2019-08-19 DIAGNOSIS — Z13220 Encounter for screening for lipoid disorders: Secondary | ICD-10-CM

## 2019-08-19 LAB — COMPREHENSIVE METABOLIC PANEL
ALT: 17 U/L (ref 0–53)
AST: 14 U/L (ref 0–37)
Albumin: 4.6 g/dL (ref 3.5–5.2)
Alkaline Phosphatase: 76 U/L (ref 39–117)
BUN: 17 mg/dL (ref 6–23)
CO2: 33 mEq/L — ABNORMAL HIGH (ref 19–32)
Calcium: 10.1 mg/dL (ref 8.4–10.5)
Chloride: 98 mEq/L (ref 96–112)
Creatinine, Ser: 1.1 mg/dL (ref 0.40–1.50)
GFR: 66.31 mL/min (ref >60.00)
Glucose, Bld: 105 mg/dL — ABNORMAL HIGH (ref 70–99)
Potassium: 4.2 mEq/L (ref 3.5–5.1)
Sodium: 137 mEq/L (ref 135–145)
Total Bilirubin: 0.5 mg/dL (ref 0.2–1.2)
Total Protein: 7.4 g/dL (ref 6.0–8.3)

## 2019-08-19 LAB — CBC WITH DIFFERENTIAL/PLATELET
Basophils Absolute: 0.1 10*3/uL (ref 0.0–0.1)
Basophils Relative: 0.8 % (ref 0.0–3.0)
Eosinophils Absolute: 0.5 10*3/uL (ref 0.0–0.7)
Eosinophils Relative: 6.5 % — ABNORMAL HIGH (ref 0.0–5.0)
HCT: 43.4 % (ref 39.0–52.0)
Hemoglobin: 14.9 g/dL (ref 13.0–17.0)
Lymphocytes Relative: 14.9 % (ref 12.0–46.0)
Lymphs Abs: 1 10*3/uL (ref 0.7–4.0)
MCHC: 34.3 g/dL (ref 30.0–36.0)
MCV: 87.1 fl (ref 78.0–100.0)
Monocytes Absolute: 0.8 10*3/uL (ref 0.1–1.0)
Monocytes Relative: 10.9 % (ref 3.0–12.0)
Neutro Abs: 4.7 10*3/uL (ref 1.4–7.7)
Neutrophils Relative %: 66.9 % (ref 43.0–77.0)
Platelets: 278 10*3/uL (ref 150.0–400.0)
RBC: 4.98 Mil/uL (ref 4.22–5.81)
RDW: 13.7 % (ref 11.5–15.5)
WBC: 7 10*3/uL (ref 4.0–10.5)

## 2019-08-19 LAB — LIPID PANEL
Cholesterol: 151 mg/dL (ref 0–200)
HDL: 50.2 mg/dL (ref >39.00)
NonHDL: 100.51
Total CHOL/HDL Ratio: 3
Triglycerides: 219 mg/dL — ABNORMAL HIGH (ref 0.0–149.0)
VLDL: 43.8 mg/dL — ABNORMAL HIGH (ref 0.0–40.0)

## 2019-08-19 LAB — URIC ACID: Uric Acid, Serum: 6.2 mg/dL (ref 4.0–7.8)

## 2019-08-19 LAB — LDL CHOLESTEROL, DIRECT: Direct LDL: 84 mg/dL

## 2019-08-19 NOTE — Progress Notes (Signed)
Luis Knapp is a 69 y.o. male with the following history as recorded in EpicCare:  Patient Active Problem List   Diagnosis Date Noted  . Rash 04/17/2017  . Prostate cancer ([place]) 11/01/2016  . Medicare annual wellness visit, initial 10/18/2015  . Hydrocele, right 06/24/2013  . Routine general medical examination at a health care facility 12/04/2010  . OBESITY, CLASS II 05/18/2010  . SCIATICA 05/18/2010  . POLYP, COLON 08/03/2007  . Essential hypertension 08/03/2007  . DIVERTICULOSIS, COLON 08/03/2007    Current Outpatient Medications  Medication Sig Dispense Refill  . aspirin EC 81 MG tablet Take 81 mg by mouth daily.    . calcium carbonate (TUMS - DOSED IN MG ELEMENTAL CALCIUM) 500 MG chewable tablet Chew 1 tablet by mouth 3 (three) times daily as needed for indigestion or heartburn.     . Cholecalciferol (VITAMIN D-3) 1000 units CAPS Take 1 capsule by mouth daily.    Marland Kitchen ibuprofen (ADVIL,MOTRIN) 200 MG tablet Take 400 mg by mouth every 6 (six) hours as needed.    Marland Kitchen losartan-hydrochlorothiazide (HYZAAR) 100-25 MG tablet Take 1 tablet by mouth daily. 90 tablet 0  . Multiple Vitamins-Minerals (MULTIVITAMIN ADULT PO) Take 1 tablet by mouth daily.    . vitamin B-12 (CYANOCOBALAMIN) 250 MCG tablet Take 250 mcg by mouth daily.     No current facility-administered medications for this visit.    Allergies: Patient has no known allergies.  Past Medical History:  Diagnosis Date  . Allergic rhinitis due to pollen   . Allergy   . Diverticulosis of colon (without mention of hemorrhage)   . GERD (gastroesophageal reflux disease)   . History of colon polyps    2008--  PRE CANEROUS/   2013-- ADENOMA  . Prostate cancer ([place]) 11/01/2016   prostate cancer  . [name]   . Unspecified essential hypertension   . Wears glasses     Past Surgical History:  Procedure Laterality Date  . COLONOSCOPY    . COLONOSCOPY WITH PROPOFOL  10-30-2011  &  2008   POLYPECTOMY  . HYDROCELE EXCISION  Right 07/18/2013   Procedure: RIGHT HYDROCELECTOMY ADULT;  Surgeon: Ardis Hughs, MD;  Location: [facility];  Service: Urology;  Laterality: Right;  . LYMPH NODE DISSECTION Bilateral 11/01/2016   Procedure: BILATERAL PELVIC LYMPH NODE DISSECTION;  Surgeon: Ardis Hughs, MD;  Location: [facility];  Service: Urology;  Laterality: Bilateral;  . POLYPECTOMY    . ROBOT ASSISTED LAPAROSCOPIC RADICAL PROSTATECTOMY N/A 11/01/2016   Procedure: XI ROBOTIC ASSISTED LAPAROSCOPIC RADICAL PROSTATECTOMY;  Surgeon: Ardis Hughs, MD;  Location: [facility];  Service: Urology;  Laterality: N/A;    Family History  Problem Relation Age of Onset  . Coronary artery disease Father   . Heart attack Father        2nd one fatal  . Pulmonary embolism Father        ?  Marland Kitchen Dementia Father   . Heart disease Father   . Dementia Mother   . Hypertension Mother   . Macular degeneration Mother   . Kidney disease Mother   . Diabetes Sister   . Non-Hodgkin's lymphoma Sister   . Colon cancer Maternal Grandfather   . Prostate cancer Neg Hx   . Rectal cancer Neg Hx   . Colon polyps Neg Hx   . Esophageal cancer Neg Hx     Social History   Tobacco Use  . Smoking status: Former Smoker    Packs/day: 3.00  Years: 25.00    Pack years: 75.00    Types: Cigarettes    Quit date: 10/16/1996    Years since quitting: 22.8  . Smokeless tobacco: Never Used  Substance Use Topics  . Alcohol use: No    Subjective:  Right knee pain x 4 months; no specific injury or trauma; seemed to start after climbing ladder to clean gutters/ hang Christmas lights; no obvious swelling; pain is localized on inner part of left knee; is able to get some relief with Advil;  Has lost 14 pounds in the past year- "eating better."     Objective:  Vitals:   08/19/19 1216  BP: (!) 142/80  Pulse: 90  Temp: 98.2 F (36.8 C)  TempSrc: Oral  SpO2: 97%  Weight: 246 lb 6.4 oz (111.8 kg)  Height: '5\' 6"'  (1.676 m)    General:  Well developed, well nourished, in no acute distress  Skin : Warm and dry.  Head: Normocephalic and atraumatic  Lungs: Respirations unlabored; clear to auscultation bilaterally without wheeze, rales, rhonchi  CVS exam: normal rate and regular rhythm.  Musculoskeletal: No deformities; no active joint inflammation  Extremities: No edema, cyanosis, clubbing  Vessels: Symmetric bilaterally  Neurologic: Alert and oriented; speech intact; face symmetrical; moves all extremities well; CNII-XII intact without focal deficit   Assessment:  1. Right knee pain, unspecified chronicity   2. Essential hypertension   3. Lipid screening   4. Arthralgia, unspecified joint     Plan:  1. Update X-ray today; will most likely need to see sports medicine; patient defers prescriptive NSAID at this time; 2. Stable; continue same medication; check Cbc, CMP today; 3. Check lipid panel; 4. Check uric acid to make sure knee pain is not secondary to gout;  This visit occurred during the SARS-CoV-2 public health emergency.  Safety protocols were in place, including screening questions prior to the visit, additional usage of staff PPE, and extensive cleaning of exam room while observing appropriate contact time as indicated for disinfecting solutions.     No follow-ups on file.  Orders Placed This Encounter  Procedures  . DG Knee Complete 4 Views Right    Standing Status:   Future    Number of Occurrences:   1    Standing Expiration Date:   10/18/2020    Order Specific Question:   Reason for Exam (SYMPTOM  OR DIAGNOSIS REQUIRED)    Answer:   right knee pain    Order Specific Question:   Preferred imaging location?    Answer:   [facility]    Order Specific Question:   Radiology Contrast Protocol - do NOT remove file path    Answer:   \\[email or web address]  . CBC with Differential/Platelet  . Comp Met (CMET)  . Lipid panel  . Uric acid    Requested Prescriptions     No prescriptions requested or ordered in this encounter

## 2019-08-20 ENCOUNTER — Encounter: Payer: Self-pay | Admitting: Family

## 2019-08-20 ENCOUNTER — Other Ambulatory Visit: Payer: Self-pay | Admitting: Family

## 2019-08-20 MED ORDER — ROSUVASTATIN CALCIUM 10 MG PO TABS: 10.0000 mg | ORAL_TABLET | Freq: Every day | ORAL | 1 refills | Status: DC

## 2019-08-22 ENCOUNTER — Ambulatory Visit (INDEPENDENT_AMBULATORY_CARE_PROVIDER_SITE_OTHER): Payer: Medicare Other | Admitting: Family Medicine

## 2019-08-22 ENCOUNTER — Other Ambulatory Visit: Payer: Self-pay

## 2019-08-22 ENCOUNTER — Other Ambulatory Visit: Payer: Self-pay | Admitting: Family

## 2019-08-22 ENCOUNTER — Ambulatory Visit: Payer: Self-pay

## 2019-08-22 ENCOUNTER — Encounter: Payer: Self-pay | Admitting: Family Medicine

## 2019-08-22 VITALS — BP 122/68 | HR 84 | Ht 66.0 in | Wt 245.6 lb

## 2019-08-22 DIAGNOSIS — G8929 Other chronic pain: Secondary | ICD-10-CM

## 2019-08-22 DIAGNOSIS — E782 Mixed hyperlipidemia: Secondary | ICD-10-CM

## 2019-08-22 DIAGNOSIS — M25561 Pain in right knee: Secondary | ICD-10-CM | POA: Diagnosis not present

## 2019-08-22 NOTE — Progress Notes (Signed)
Subjective:    CC: R knee pain  I, Molly Weber, LAT, ATC, am serving as scribe for Dr. Lynne Leader.  HPI: Pt is a 69 y/o male presenting w/ c/o R knee pain since Christmas after climbing a ladder to clean his gutters/hang Xmas lights.  He locates his pain to his R anterior-medial knee.  He rates his pain as mild-mod and describes his pain as a dull ache.  He denies a fall or trauma to his knee.  He notes some stiffness and dysfunction and some popping and clicking.  R knee swelling: No R knee mechanical symptoms: Yes Aggravating factors: Sitting w/ R knee flexed; driving; squatting; sitting w/ R ankle crossed over L knee; going down stairs Treatments tried: Advil; Tylenol  Diagnostic imaging: R knee XR- 08/19/19  Pertinent review of Systems: No fevers or chills  Relevant historical information: No history gout   Objective:    Vitals:   08/22/19 1002  BP: 122/68  Pulse: 84  SpO2: 96%   General: Well Developed, well nourished, and in no acute distress.   MSK: Right knee: Moderate effusion.  Normal-appearing otherwise. Range of motion 0-120 degrees. Tender palpation medial and lateral joint line. Stable ligamentous exam. Positive McMurray's test. Intact flexion and extension strength.  Lab and Radiology Results: Lab Results  Component Value Date   LABURIC 6.2 08/19/2019     DG Knee Complete 4 Views Right  Result Date: 08/19/2019 CLINICAL DATA:  Right knee pain. EXAM: RIGHT KNEE - COMPLETE 4+ VIEW COMPARISON:  None. FINDINGS: There is no acute displaced fracture or dislocation. There is a large suprapatellar joint effusion without definite evidence for lipohemarthrosis. The joint spaces are relatively well preserved with mild joint space narrowing involving the medial and patellofemoral compartments. IMPRESSION: 1. No acute displaced fracture or dislocation. 2. Large suprapatellar joint effusion. Electronically Signed   By: Constance Holster M.D.   On: 08/19/2019  20:32  I, Lynne Leader, personally (independently) visualized and performed the interpretation of the images attached in this note.  Diagnostic Limited MSK Ultrasound of: Right knee Quad tendon intact. Large joint effusion present. Patellar tendon normal. Medial joint line significantly narrowed with degenerative appearing partially extruded meniscus.  Joint effusion extruding from meniscus area at medial joint line at area of maximum tenderness. Lateral joint line also narrowed with partially extruded degenerative appearing lateral meniscus. No Baker's cyst Bony structures normal otherwise Impression: DJD with partial extruded medial meniscus  Procedure: Real-time Ultrasound Guided Injection of right knee suprapatellar space knee joint Device: Philips Affiniti 50G Images permanently stored and available for review in the ultrasound unit. Verbal informed consent obtained.  Discussed risks and benefits of procedure. Warned about infection bleeding damage to structures skin hypopigmentation and fat atrophy among others. Patient expresses understanding and agreement Time-out conducted.   Noted no overlying erythema, induration, or other signs of local infection.   Skin prepped in a sterile fashion.   Local anesthesia: Topical Ethyl chloride.   With sterile technique and under real time ultrasound guidance:  40 mg Kenalog 2 mL of Marcaine injected easily.   Completed without difficulty   Pain immediately resolved suggesting accurate placement of the medication.   Advised to call if fevers/chills, erythema, induration, drainage, or persistent bleeding.   Images permanently stored and available for review in the ultrasound unit.  Impression: Technically successful ultrasound guided injection.        Impression and Recommendations:    Assessment and Plan: 69 y.o. male with right  knee pain worsening after doing a lot of ladder climbing over the holidays.  Patient has DJD seen on recent  x-ray.  On ultrasound examination he has a moderate effusion to large effusion along with very degenerative appearing medial meniscus with partially extruded meniscus and joint effusion in this area also extruding from the medial joint line. He had excellent work-up via his PCP prior to visit today including uric acid and x-ray.  Uric acid fortunately was relatively normal..  Hopefully will have great response to injection.  Also recommend Voltaren gel and compressive knee sleeve.  Recheck back with me as needed.  PDMP not reviewed this encounter. Orders Placed This Encounter  Procedures  . [place] LIMITED JOINT SPACE STRUCTURES LOW RIGHT(NO LINKED CHARGES)    Order Specific Question:   Reason for Exam (SYMPTOM  OR DIAGNOSIS REQUIRED)    Answer:   R knee pain    Order Specific Question:   Preferred imaging location?    Answer:   [place]   No orders of the defined types were placed in this encounter.   Discussed warning signs or symptoms. Please see discharge instructions. Patient expresses understanding.   The above documentation has been reviewed and is accurate and complete Lynne Leader

## 2019-08-22 NOTE — Patient Instructions (Addendum)
You had a R knee injection today. Call or go to the ER if you develop a large red swollen joint with extreme pain or oozing puss.   I recommend you obtained a compression sleeve to help with your joint problems. There are many options on the market however I recommend obtaining a knee Body Helix compression sleeve.  You can find information (including how to appropriate measure yourself for sizing) can be found at www.Body http://www.lambert.com/.  Many of these products are health savings account (HSA) eligible.   You can use the compression sleeve at any time throughout the day but is most important to use while being active as well as for 2 hours post-activity.   It is appropriate to ice following activity with the compression sleeve in place.   Recheck with me as needed.

## 2019-09-01 ENCOUNTER — Ambulatory Visit (INDEPENDENT_AMBULATORY_CARE_PROVIDER_SITE_OTHER): Payer: Medicare Other

## 2019-09-01 ENCOUNTER — Other Ambulatory Visit: Payer: Self-pay

## 2019-09-01 VITALS — BP 132/80 | HR 80 | Temp 98.3°F | Resp 16 | Ht 66.0 in | Wt 246.8 lb

## 2019-09-01 DIAGNOSIS — Z0000 Encounter for general adult medical examination without abnormal findings: Secondary | ICD-10-CM

## 2019-09-01 NOTE — Progress Notes (Addendum)
Subjective:   Luis Knapp is a 69 y.o. male who presents for Medicare Annual/Subsequent preventive examination.  Review of Systems:  No ROS. Medicare Wellness Visit Cardiac Risk Factors include: advanced age (>44men, >40 women);dyslipidemia;family history of premature cardiovascular disease;hypertension;male gender;obesity (BMI >30kg/m2) Sleep Patterns: no trouble sleeping. Home Safety/Smoke Alarms: Feels safe in home.  Smoke alarms in place. Living Environment: Lives in a 1-story home with his wife.  No needs for DME; good family support system. Seat Belt Safety/Bike Helmet: Wears seat belt.    Objective:    Vitals: BP 132/80 (BP Location: Left Arm, Patient Position: Sitting, Cuff Size: Normal)   Pulse 80   Temp 98.3 F (36.8 C)   Resp 16   Ht 5\' 6"  (1.676 m)   Wt 246 lb 12.8 oz (111.9 kg)   SpO2 97%   BMI 39.83 kg/m   Body mass index is 39.83 kg/m.  Advanced Directives 09/01/2019 03/20/2018 03/13/2017 11/11/2016 11/01/2016 10/30/2016 07/18/2013  Does Patient Have a Medical Advance Directive? Yes Yes Yes Yes No No Patient does not have advance directive;Patient would not like information  Type of Scientist, forensic Power of [place];Living will [place];Living will [place];Living will [place];Living will - - -  Does patient want to make changes to medical advance directive? No - Patient declined - - - - - -  Copy of [place] in Chart? No - copy requested - - No - copy requested - - -  Would patient like information on creating a medical advance directive? - - - - No - Patient declined No - Patient declined -    Tobacco Social History   Tobacco Use  Smoking Status Former Smoker  . Packs/day: 3.00  . Years: 25.00  . Pack years: 75.00  . Types: Cigarettes  . Quit date: 10/16/1996  . Years since quitting: 22.8  Smokeless Tobacco Never Used     Counseling given: No   Clinical  Intake:  Pre-visit preparation completed: Yes  Pain : No/denies pain Pain Score: 0-No pain     BMI - recorded: 39.8 Nutritional Status: BMI > 30  Obese Nutritional Risks: None Diabetes: No  How often do you need to have someone help you when you read instructions, pamphlets, or other written materials from your doctor or pharmacy?: 1 - Never What is the last grade level you completed in school?: 12th grade  Interpreter Needed?: No  Information entered by :: Blimi Godby N. Lowell Guitar, LPN  Past Medical History:  Diagnosis Date  . Allergic rhinitis due to pollen   . Allergy   . Diverticulosis of colon (without mention of hemorrhage)   . GERD (gastroesophageal reflux disease)   . History of colon polyps    2008--  PRE CANEROUS/   2013-- ADENOMA  . Prostate cancer ([place]) 11/01/2016   prostate cancer  . [name]   . Unspecified essential hypertension   . Wears glasses    Past Surgical History:  Procedure Laterality Date  . COLONOSCOPY    . COLONOSCOPY WITH PROPOFOL  10-30-2011  &  2008   POLYPECTOMY  . HYDROCELE EXCISION Right 07/18/2013   Procedure: RIGHT HYDROCELECTOMY ADULT;  Surgeon: Ardis Hughs, MD;  Location: [facility];  Service: Urology;  Laterality: Right;  . LYMPH NODE DISSECTION Bilateral 11/01/2016   Procedure: BILATERAL PELVIC LYMPH NODE DISSECTION;  Surgeon: Ardis Hughs, MD;  Location: [facility];  Service: Urology;  Laterality:  Bilateral;  . POLYPECTOMY    . ROBOT ASSISTED LAPAROSCOPIC RADICAL PROSTATECTOMY N/A 11/01/2016   Procedure: XI ROBOTIC ASSISTED LAPAROSCOPIC RADICAL PROSTATECTOMY;  Surgeon: Ardis Hughs, MD;  Location: [facility];  Service: Urology;  Laterality: N/A;   Family History  Problem Relation Age of Onset  . Coronary artery disease Father   . Heart attack Father        2nd one fatal  . Pulmonary embolism Father        ?  Marland Kitchen Dementia Father   . Heart disease Father   . Dementia Mother   . Hypertension Mother    . Macular degeneration Mother   . Kidney disease Mother   . Diabetes Sister   . Non-Hodgkin's lymphoma Sister   . Colon cancer Maternal Grandfather   . Prostate cancer Neg Hx   . Rectal cancer Neg Hx   . Colon polyps Neg Hx   . Esophageal cancer Neg Hx    Social History   Socioeconomic History  . Marital status: Married    Spouse name: Not on file  . Number of children: 2  . Years of education: Not on file  . Highest education level: 12th grade  Occupational History  . 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 Occupation: consulting firm    Comment: Manufacturing engineer  Tobacco Use  . Smoking status: Former Smoker    Packs/day: 3.00    Years: 25.00    Pack years: 75.00    Types: Cigarettes    Quit date: 10/16/1996    Years since quitting: 22.8  . Smokeless tobacco: Never Used  Substance and Sexual Activity  . Alcohol use: No  . Drug use: No  . Sexual activity: Yes    Comment: with aid of SIldenafil and VED  Other Topics Concern  . Not on file  Social History Narrative   Married '77- 19 years divorced; married '02. 2 sons - '79, '80; 1 grandson. Things are ok. Work is OK - Dance movement psychotherapist.    Fun/Hobby: Works at [employer] on a regular basis   Social Determinants of Radio broadcast assistant Strain:   . Difficulty of Paying Living Expenses:   Food Insecurity:   . Worried About Charity fundraiser in the Last Year:   . Arboriculturist in the Last Year:   Transportation Needs:   . Film/video editor (Medical):   Marland Kitchen Lack of Transportation (Non-Medical):   Physical Activity:   . Days of Exercise per Week:   . Minutes of Exercise per Session:   Stress:   . Feeling of Stress :   Social Connections:   . Frequency of Communication with Friends and Family:   . Frequency of Social Gatherings with Friends and Family:   . Attends Religious Services:   . Active Member of Clubs or Organizations:    . Attends Archivist Meetings:   Marland Kitchen Marital Status:     Outpatient Encounter Medications as of 09/01/2019  Medication Sig  . aspirin EC 81 MG tablet Take 81 mg by mouth daily.  . calcium carbonate (TUMS - DOSED IN MG ELEMENTAL CALCIUM) 500 MG chewable tablet Chew 1 tablet by mouth 3 (three) times daily as needed for indigestion or heartburn.   . Cholecalciferol (VITAMIN D-3) 1000 units CAPS Take 1 capsule by mouth daily.  Marland Kitchen ibuprofen (ADVIL,MOTRIN) 200 MG tablet Take 400  mg by mouth every 6 (six) hours as needed.  Marland Kitchen losartan-hydrochlorothiazide (HYZAAR) 100-25 MG tablet Take 1 tablet by mouth daily.  . Multiple Vitamins-Minerals (MULTIVITAMIN ADULT PO) Take 1 tablet by mouth daily.  . rosuvastatin (CRESTOR) 10 MG tablet Take 1 tablet (10 mg total) by mouth daily.  . vitamin B-12 (CYANOCOBALAMIN) 250 MCG tablet Take 250 mcg by mouth daily.   No facility-administered encounter medications on file as of 09/01/2019.    Activities of Daily Living In your present state of health, do you have any difficulty performing the following activities: 09/01/2019  Hearing? N  Vision? N  Difficulty concentrating or making decisions? N  Walking or climbing stairs? N  Dressing or bathing? N  Doing errands, shopping? N  Preparing Food and eating ? N  Using the Toilet? N  In the past six months, have you accidently leaked urine? N  Do you have problems with loss of bowel control? N  Managing your Medications? N  Managing your Finances? N  Housekeeping or managing your Housekeeping? N  Some recent data might be hidden    Patient Care Team: Marrian Salvage, [place] as PCP - General (Internal Medicine) Ardis Hughs, MD as Attending Physician (Urology) Tyler Pita, MD as Consulting Physician (Radiation Oncology)   Assessment:   This is a routine wellness examination for Luis Knapp.  Exercise Activities and Dietary recommendations Current Exercise Habits: The patient does not  participate in regular exercise at present, Exercise limited by: orthopedic condition(s)(knee pain)  Goals    . Patient Stated     Lose weight by monitoring my diet more closely and increase my physical activity.    . Patient Stated     Lose around 10 pounds and continue to be active.       Fall Risk Fall Risk  09/01/2019 07/23/2018 02/22/2018 01/17/2017 10/18/2015  Falls in the past year? 0 0 No No No  Number falls in past yr: 0 0 - - -  Injury with Fall? 0 - - - -  Risk for fall due to : No Fall Risks - - - -  Follow up Falls evaluation completed;Education provided;Falls prevention discussed - - - -   Is the patient's home free of loose throw rugs in walkways, pet beds, electrical cords, etc?   yes      Grab bars in the bathroom? yes      Handrails on the stairs?   yes      Adequate lighting?   yes  Depression Screen PHQ 2/9 Scores 09/01/2019 07/23/2018 01/17/2017 10/18/2015  PHQ - 2 Score 0 0 0 0    Cognitive Function     6CIT Screen 09/01/2019  What Year? 0 points  What month? 0 points  What time? 0 points  Count back from 20 0 points  Months in reverse 0 points  Repeat phrase 0 points  Total Score 0    Immunization History  Administered Date(s) Administered  . Influenza, High Dose Seasonal PF 01/17/2017  . Pneumococcal Conjugate-13 06/23/2013  . Pneumococcal Polysaccharide-23 03/20/2012, 07/23/2018  . Td 09/20/2007  . Tdap 02/22/2018    Qualifies for Shingles Vaccine? yes  Screening Tests Health Maintenance  Topic Date Due  . COVID-19 Vaccine (1) Never done  . INFLUENZA VACCINE  12/14/2019  . COLONOSCOPY  03/27/2020  . TETANUS/TDAP  02/23/2028  . Hepatitis C Screening  Completed  . PNA vac Low Risk Adult  Completed   Cancer Screenings: Lung: Low Dose CT Chest recommended  if Age 64-80 years, 91 pack-year currently smoking OR have quit w/in 15years. Patient does not qualify. Colorectal: Yes     Plan:     Reviewed health maintenance screenings with patient  today and relevant education, vaccines, and/or referrals were provided.    Continue doing brain stimulating activities (puzzles, reading, adult coloring books, staying active) to keep memory sharp.    Continue to eat heart healthy diet (full of fruits, vegetables, whole grains, lean protein, water--limit salt, fat, and sugar intake) and increase physical activity as tolerated.  I have personally reviewed and noted the following in the patient's chart:   . Medical and social history . Use of alcohol, tobacco or illicit drugs  . Current medications and supplements . Functional ability and status . Nutritional status . Physical activity . Advanced directives . List of other physicians . Hospitalizations, surgeries, and ER visits in previous 12 months . Vitals . Screenings to include cognitive, depression, and falls . Referrals and appointments  In addition, I have reviewed and discussed with patient certain preventive protocols, quality metrics, and best practice recommendations. A written personalized care plan for preventive services as well as general preventive health recommendations were provided to patient.     Sheral Flow, LPN  [id number]  Nurse Health Advisor   Medical screening examination/treatment/procedure(s) were performed by non-physician practitioner and as supervising provider I was immediately available for consultation/collaboration.  I agree with above. Marrian Salvage, FNP

## 2019-09-01 NOTE — Patient Instructions (Addendum)
Luis Knapp , Thank you for taking time to come for your Medicare Wellness Visit. I appreciate your ongoing commitment to your health goals. Please review the following plan we discussed and let me know if I can assist you in the future.   Screening recommendations/referrals: Colorectal Screening: last done on 03/27/2017  Vision and Dental Exams: Recommended annual ophthalmology exams for early detection of glaucoma and other disorders of the eye Recommended annual dental exams for proper oral hygiene  Vaccinations: Influenza vaccine: 01/17/2017; overdue Pneumococcal vaccine: completed Tdap vaccine: 02/22/2018; due every 10 years Shingles vaccine: Please call your insurance company to determine your out of pocket expense for the Shingrix vaccine. You may receive this vaccine at your local pharmacy. Covid vaccine: Pfizer 07/19/2019, 08/09/2019  Advanced directives: Advance directives discussed with you today. Please bring a copy of your POA (Power of [place]) and/or Living Will to your next appointment.  Goals:  Recommend to drink at least 6-8 8oz glasses of water per day.  Recommend to exercise for at least 150 minutes per week.  Recommend to remove any items from the home that may cause slips or trips.  Recommend to decrease portion sizes by eating 3 small healthy meals and at least 2 healthy snacks per day.  Recommend to begin DASH diet as directed below  Recommend to continue efforts to reduce smoking habits until no longer smoking. Smoking Cessation literature is attached below.  Next appointment: Please schedule your Annual Wellness Visit with your Nurse Health Advisor in one year.  Preventive Care 70 Years and Older, Male Preventive care refers to lifestyle choices and visits with your health care provider that can promote health and wellness. What does preventive care include?  A yearly physical exam. This is also called an annual well check.  Dental exams once or twice a  year.  Routine eye exams. Ask your health care provider how often you should have your eyes checked.  Personal lifestyle choices, including:  Daily care of your teeth and gums.  Regular physical activity.  Eating a healthy diet.  Avoiding tobacco and drug use.  Limiting alcohol use.  Practicing safe sex.  Taking low doses of aspirin every day if recommended by your health care provider..  Taking vitamin and mineral supplements as recommended by your health care provider. What happens during an annual well check? The services and screenings done by your health care provider during your annual well check will depend on your age, overall health, lifestyle risk factors, and family history of disease. Counseling  Your health care provider may ask you questions about your:  Alcohol use.  Tobacco use.  Drug use.  Emotional well-being.  Home and relationship well-being.  Sexual activity.  Eating habits.  History of falls.  Memory and ability to understand (cognition).  Work and work Statistician. Screening  You may have the following tests or measurements:  Height, weight, and BMI.  Blood pressure.  Lipid and cholesterol levels. These may be checked every 5 years, or more frequently if you are over 24 years old.  Skin check.  Lung cancer screening. You may have this screening every year starting at age 70 if you have a 30-pack-year history of smoking and currently smoke or have quit within the past 15 years.  Fecal occult blood test (FOBT) of the stool. You may have this test every year starting at age 70.  Flexible sigmoidoscopy or colonoscopy. You may have a sigmoidoscopy every 5 years or a colonoscopy every 10 years  starting at age 70.  Prostate cancer screening. Recommendations will vary depending on your family history and other risks.  Hepatitis C blood test.  Hepatitis B blood test.  Sexually transmitted disease (STD) testing.  Diabetes screening.  This is done by checking your blood sugar (glucose) after you have not eaten for a while (fasting). You may have this done every 1-3 years.  Abdominal aortic aneurysm (AAA) screening. You may need this if you are a current or former smoker.  Osteoporosis. You may be screened starting at age 62 if you are at high risk. Talk with your health care provider about your test results, treatment options, and if necessary, the need for more tests. Vaccines  Your health care provider may recommend certain vaccines, such as:  Influenza vaccine. This is recommended every year.  Tetanus, diphtheria, and acellular pertussis (Tdap, Td) vaccine. You may need a Td booster every 10 years.  Zoster vaccine. You may need this after age 70.  Pneumococcal 13-valent conjugate (PCV13) vaccine. One dose is recommended after age 19.  Pneumococcal polysaccharide (PPSV23) vaccine. One dose is recommended after age 24. Talk to your health care provider about which screenings and vaccines you need and how often you need them. This information is not intended to replace advice given to you by your health care provider. Make sure you discuss any questions you have with your health care provider. Document Released: 05/28/2015 Document Revised: 01/19/2016 Document Reviewed: 03/02/2015 Elsevier Interactive Patient Education  2017 [place] Prevention in the Home Falls can cause injuries. They can happen to people of all ages. There are many things you can do to make your home safe and to help prevent falls. What can I do on the outside of my home?  Regularly fix the edges of walkways and driveways and fix any cracks.  Remove anything that might make you trip as you walk through a door, such as a raised step or threshold.  Trim any bushes or trees on the path to your home.  Use bright outdoor lighting.  Clear any walking paths of anything that might make someone trip, such as rocks or tools.  Regularly  check to see if handrails are loose or broken. Make sure that both sides of any steps have handrails.  Any raised decks and porches should have guardrails on the edges.  Have any leaves, snow, or ice cleared regularly.  Use sand or salt on walking paths during winter.  Clean up any spills in your garage right away. This includes oil or grease spills. What can I do in the bathroom?  Use night lights.  Install grab bars by the toilet and in the tub and shower. Do not use towel bars as grab bars.  Use non-skid mats or decals in the tub or shower.  If you need to sit down in the shower, use a plastic, non-slip stool.  Keep the floor dry. Clean up any water that spills on the floor as soon as it happens.  Remove soap buildup in the tub or shower regularly.  Attach bath mats securely with double-sided non-slip rug tape.  Do not have throw rugs and other things on the floor that can make you trip. What can I do in the bedroom?  Use night lights.  Make sure that you have a light by your bed that is easy to reach.  Do not use any sheets or blankets that are too big for your bed. They should not hang down  onto the floor.  Have a firm chair that has side arms. You can use this for support while you get dressed.  Do not have throw rugs and other things on the floor that can make you trip. What can I do in the kitchen?  Clean up any spills right away.  Avoid walking on wet floors.  Keep items that you use a lot in easy-to-reach places.  If you need to reach something above you, use a strong step stool that has a grab bar.  Keep electrical cords out of the way.  Do not use floor polish or wax that makes floors slippery. If you must use wax, use non-skid floor wax.  Do not have throw rugs and other things on the floor that can make you trip. What can I do with my stairs?  Do not leave any items on the stairs.  Make sure that there are handrails on both sides of the stairs and  use them. Fix handrails that are broken or loose. Make sure that handrails are as long as the stairways.  Check any carpeting to make sure that it is firmly attached to the stairs. Fix any carpet that is loose or worn.  Avoid having throw rugs at the top or bottom of the stairs. If you do have throw rugs, attach them to the floor with carpet tape.  Make sure that you have a light switch at the top of the stairs and the bottom of the stairs. If you do not have them, ask someone to add them for you. What else can I do to help prevent falls?  Wear shoes that:  Do not have high heels.  Have rubber bottoms.  Are comfortable and fit you well.  Are closed at the toe. Do not wear sandals.  If you use a stepladder:  Make sure that it is fully opened. Do not climb a closed stepladder.  Make sure that both sides of the stepladder are locked into place.  Ask someone to hold it for you, if possible.  Clearly mark and make sure that you can see:  Any grab bars or handrails.  First and last steps.  Where the edge of each step is.  Use tools that help you move around (mobility aids) if they are needed. These include:  Canes.  Walkers.  Scooters.  Crutches.  Turn on the lights when you go into a dark area. Replace any light bulbs as soon as they burn out.  Set up your furniture so you have a clear path. Avoid moving your furniture around.  If any of your floors are uneven, fix them.  If there are any pets around you, be aware of where they are.  Review your medicines with your doctor. Some medicines can make you feel dizzy. This can increase your chance of falling. Ask your doctor what other things that you can do to help prevent falls. This information is not intended to replace advice given to you by your health care provider. Make sure you discuss any questions you have with your health care provider. Document Released: 02/25/2009 Document Revised: 10/07/2015 Document  Reviewed: 06/05/2014 Elsevier Interactive Patient Education  2017 [employer].

## 2019-09-26 DIAGNOSIS — L813 Cafe au lait spots: Secondary | ICD-10-CM | POA: Diagnosis not present

## 2019-09-26 DIAGNOSIS — L821 Other seborrheic keratosis: Secondary | ICD-10-CM | POA: Diagnosis not present

## 2019-09-26 DIAGNOSIS — L720 Epidermal cyst: Secondary | ICD-10-CM | POA: Diagnosis not present

## 2019-09-26 DIAGNOSIS — L918 Other hypertrophic disorders of the skin: Secondary | ICD-10-CM | POA: Diagnosis not present

## 2019-09-26 DIAGNOSIS — D1801 Hemangioma of skin and subcutaneous tissue: Secondary | ICD-10-CM | POA: Diagnosis not present

## 2019-09-26 DIAGNOSIS — D225 Melanocytic nevi of trunk: Secondary | ICD-10-CM | POA: Diagnosis not present

## 2019-10-06 ENCOUNTER — Other Ambulatory Visit: Payer: Self-pay | Admitting: Family

## 2019-10-15 ENCOUNTER — Other Ambulatory Visit: Payer: Self-pay

## 2019-10-15 ENCOUNTER — Encounter: Payer: Self-pay | Admitting: Family

## 2019-10-15 ENCOUNTER — Other Ambulatory Visit: Payer: Self-pay | Admitting: Family

## 2019-10-15 ENCOUNTER — Other Ambulatory Visit (INDEPENDENT_AMBULATORY_CARE_PROVIDER_SITE_OTHER): Payer: Medicare Other

## 2019-10-15 DIAGNOSIS — E782 Mixed hyperlipidemia: Secondary | ICD-10-CM

## 2019-10-15 LAB — COMPREHENSIVE METABOLIC PANEL
ALT: 17 U/L (ref 0–53)
AST: 16 U/L (ref 0–37)
Albumin: 4.3 g/dL (ref 3.5–5.2)
Alkaline Phosphatase: 72 U/L (ref 39–117)
BUN: 17 mg/dL (ref 6–23)
CO2: 30 mEq/L (ref 19–32)
Calcium: 9.4 mg/dL (ref 8.4–10.5)
Chloride: 101 mEq/L (ref 96–112)
Creatinine, Ser: 1.09 mg/dL (ref 0.40–1.50)
GFR: 66.98 mL/min (ref >60.00)
Glucose, Bld: 115 mg/dL — ABNORMAL HIGH (ref 70–99)
Potassium: 4.4 mEq/L (ref 3.5–5.1)
Sodium: 136 mEq/L (ref 135–145)
Total Bilirubin: 0.3 mg/dL (ref 0.2–1.2)
Total Protein: 6.9 g/dL (ref 6.0–8.3)

## 2019-10-15 LAB — LIPID PANEL
Cholesterol: 110 mg/dL (ref 0–200)
HDL: 45.9 mg/dL (ref >39.00)
LDL Cholesterol: 41 mg/dL (ref 0–99)
NonHDL: 63.62
Total CHOL/HDL Ratio: 2
Triglycerides: 113 mg/dL (ref 0.0–149.0)
VLDL: 22.6 mg/dL (ref 0.0–40.0)

## 2019-10-15 MED ORDER — ROSUVASTATIN CALCIUM 5 MG PO TABS: 5.0000 mg | ORAL_TABLET | Freq: Every day | ORAL | 3 refills | Status: DC

## 2019-11-08 ENCOUNTER — Other Ambulatory Visit: Payer: Self-pay | Admitting: Family

## 2020-01-05 DIAGNOSIS — R9721 Rising PSA following treatment for malignant neoplasm of prostate: Secondary | ICD-10-CM | POA: Diagnosis not present

## 2020-01-05 DIAGNOSIS — N5231 Erectile dysfunction following radical prostatectomy: Secondary | ICD-10-CM | POA: Diagnosis not present

## 2020-01-05 DIAGNOSIS — N393 Stress incontinence (female) (male): Secondary | ICD-10-CM | POA: Diagnosis not present

## 2020-01-09 DIAGNOSIS — H524 Presbyopia: Secondary | ICD-10-CM | POA: Diagnosis not present

## 2020-01-09 DIAGNOSIS — H52223 Regular astigmatism, bilateral: Secondary | ICD-10-CM | POA: Diagnosis not present

## 2020-01-09 DIAGNOSIS — H2510 Age-related nuclear cataract, unspecified eye: Secondary | ICD-10-CM | POA: Diagnosis not present

## 2020-01-09 DIAGNOSIS — H35371 Puckering of macula, right eye: Secondary | ICD-10-CM | POA: Diagnosis not present

## 2020-01-09 DIAGNOSIS — H5203 Hypermetropia, bilateral: Secondary | ICD-10-CM | POA: Diagnosis not present

## 2020-02-14 DIAGNOSIS — Z20822 Contact with and (suspected) exposure to covid-19: Secondary | ICD-10-CM | POA: Diagnosis not present

## 2020-03-05 DIAGNOSIS — Z23 Encounter for immunization: Secondary | ICD-10-CM | POA: Diagnosis not present

## 2020-03-08 ENCOUNTER — Other Ambulatory Visit: Payer: Self-pay | Admitting: Family

## 2020-06-01 ENCOUNTER — Other Ambulatory Visit: Payer: Self-pay | Admitting: Family

## 2020-06-22 DIAGNOSIS — Z8546 Personal history of malignant neoplasm of prostate: Secondary | ICD-10-CM | POA: Diagnosis not present

## 2020-06-29 DIAGNOSIS — N5231 Erectile dysfunction following radical prostatectomy: Secondary | ICD-10-CM | POA: Diagnosis not present

## 2020-06-29 DIAGNOSIS — N393 Stress incontinence (female) (male): Secondary | ICD-10-CM | POA: Diagnosis not present

## 2020-06-29 DIAGNOSIS — Z8546 Personal history of malignant neoplasm of prostate: Secondary | ICD-10-CM | POA: Diagnosis not present

## 2020-06-30 ENCOUNTER — Encounter: Payer: Self-pay | Admitting: Gastroenterology

## 2020-07-06 ENCOUNTER — Other Ambulatory Visit: Payer: Self-pay | Admitting: Family

## 2020-07-06 ENCOUNTER — Encounter: Payer: Self-pay | Admitting: Family

## 2020-07-06 DIAGNOSIS — Z1211 Encounter for screening for malignant neoplasm of colon: Secondary | ICD-10-CM

## 2020-08-13 ENCOUNTER — Other Ambulatory Visit: Payer: Self-pay

## 2020-08-13 ENCOUNTER — Ambulatory Visit (AMBULATORY_SURGERY_CENTER): Payer: Self-pay | Admitting: *Deleted

## 2020-08-13 VITALS — Ht 66.0 in | Wt 233.0 lb

## 2020-08-13 DIAGNOSIS — Z860100 Personal history of colon polyps, unspecified: Secondary | ICD-10-CM

## 2020-08-13 MED ORDER — SUTAB 1479-225-188 MG PO TABS: 24.0000 | ORAL_TABLET | ORAL | 0 refills | Status: DC

## 2020-08-13 NOTE — Progress Notes (Signed)
No egg or soy allergy known to patient  No issues with past sedation with any surgeries or procedures Patient denies ever being told they had issues or difficulty with intubation  No FH of Malignant Hyperthermia No diet pills per patient No home 02 use per patient  No blood thinners per patient  Pt denies issues with constipation  No A fib or A flutter  EMMI video to pt or via [place] guidelines implemented in [place] today with Pt and RN  Pt is fully vaccinated  for [employer] given to pt in PV today , Code to Pharmacy and  NO PA's for preps discussed with pt In PV today  Discussed with pt there will be an out-of-pocket cost for prep and that varies from $0 to 70 dollars   Due to the COVID-19 pandemic we are asking patients to follow certain guidelines.  Pt aware of COVID protocols and [facility] guidelines

## 2020-09-01 ENCOUNTER — Ambulatory Visit (INDEPENDENT_AMBULATORY_CARE_PROVIDER_SITE_OTHER): Payer: Medicare Other

## 2020-09-01 ENCOUNTER — Other Ambulatory Visit: Payer: Self-pay

## 2020-09-01 ENCOUNTER — Telehealth: Payer: Self-pay | Admitting: Family

## 2020-09-01 VITALS — BP 140/70 | HR 68 | Temp 98.4°F | Ht 66.0 in | Wt 227.2 lb

## 2020-09-01 DIAGNOSIS — Z0000 Encounter for general adult medical examination without abnormal findings: Secondary | ICD-10-CM

## 2020-09-01 NOTE — Telephone Encounter (Signed)
I have called the pt to relay the message and his wife answered. She stated that she will pass along the message and he will give [place] a call back on their decision.   If he does decide to stay at [facility] please make him a f/u appt for the next month or so for DR. Burns.

## 2020-09-01 NOTE — Telephone Encounter (Signed)
Please remind him that he is due for OV with provider to check blood pressure/ cholesterol;  If he wants to stay here ( which I expect he will), let's get him scheduled with Dr. Quay Burow in the next month or so please.

## 2020-09-01 NOTE — Progress Notes (Addendum)
Subjective:   Luis Knapp is a 70 y.o. male who presents for Medicare Annual/Subsequent preventive examination.  Review of Systems    No ROS. Medicare Wellness Visit. Additional risk factors are reflected in social history. Cardiac Risk Factors include: advanced age (>58men, >89 women);dyslipidemia;hypertension;male gender;obesity (BMI >30kg/m2);family history of premature cardiovascular disease     Objective:    Today's Vitals   09/01/20 0853  BP: 140/70  Pulse: 68  Temp: 98.4 F (36.9 C)  SpO2: 98%  Weight: 227 lb 3.2 oz (103.1 kg)  Height: 5\' 6"  (1.676 m)  PainSc: 0-No pain   Body mass index is 36.67 kg/m.  Advanced Directives 09/01/2020 09/01/2019 03/20/2018 03/13/2017 11/11/2016 11/01/2016 10/30/2016  Does Patient Have a Medical Advance Directive? Yes Yes Yes Yes Yes No No  Type of Advance Directive Living will;Healthcare Power of [place];Living will [place];Living will [place];Living will [place];Living will - -  Does patient want to make changes to medical advance directive? No - Patient declined No - Patient declined - - - - -  Copy of [place] in Chart? No - copy requested No - copy requested - - No - copy requested - -  Would patient like information on creating a medical advance directive? - - - - - No - Patient declined No - Patient declined    Current Medications (verified) Outpatient Encounter Medications as of 09/01/2020  Medication Sig   aspirin EC 81 MG tablet Take 81 mg by mouth daily.   calcium carbonate (TUMS - DOSED IN MG ELEMENTAL CALCIUM) 500 MG chewable tablet Chew 1 tablet by mouth 3 (three) times daily as needed for indigestion or heartburn.    Cholecalciferol (VITAMIN D-3) 1000 units CAPS Take 1 capsule by mouth daily.   ibuprofen (ADVIL,MOTRIN) 200 MG tablet Take 400 mg by mouth every 6 (six) hours as needed.    losartan-hydrochlorothiazide (HYZAAR) 100-25 MG tablet TAKE 1 TABLET BY MOUTH EVERY DAY   Multiple Vitamins-Minerals (MULTIVITAMIN ADULT PO) Take 1 tablet by mouth daily.   rosuvastatin (CRESTOR) 5 MG tablet Take 1 tablet (5 mg total) by mouth daily.   Sodium Sulfate-Mag Sulfate-KCl (SUTAB) [phone] MG TABS Take 24 tablets by mouth as directed. MANUFACTURER CODES!! BIN: [id number] PCN: CN GROUP: [id number] MEMBER ID: [id number];[facility] AS SECONDARY INSURANCE ;NO PRIOR AUTHORIZATION   vitamin B-12 (CYANOCOBALAMIN) 250 MCG tablet Take 250 mcg by mouth daily.   No facility-administered encounter medications on file as of 09/01/2020.    Allergies (verified) Patient has no known allergies.   History: Past Medical History:  Diagnosis Date   Allergic rhinitis due to pollen    Allergy    Diverticulosis of colon (without mention of hemorrhage)    GERD (gastroesophageal reflux disease)    History of colon polyps    2008--  PRE CANEROUS/   2013-- ADENOMA   Prostate cancer ([place]) 11/01/2016   prostate cancer   [name]    Unspecified essential hypertension    Wears glasses    Past Surgical History:  Procedure Laterality Date   COLONOSCOPY     COLONOSCOPY WITH PROPOFOL  10-30-2011  &  2008   POLYPECTOMY   HYDROCELE EXCISION Right 07/18/2013   Procedure: RIGHT HYDROCELECTOMY ADULT;  Surgeon: Ardis Hughs, MD;  Location: [facility];  Service: Urology;  Laterality: Right;   LYMPH NODE DISSECTION Bilateral 11/01/2016   Procedure: BILATERAL PELVIC LYMPH NODE DISSECTION;  Surgeon:  Ardis Hughs, MD;  Location: [facility];  Service: Urology;  Laterality: Bilateral;   POLYPECTOMY     ROBOT ASSISTED LAPAROSCOPIC RADICAL PROSTATECTOMY N/A 11/01/2016   Procedure: XI ROBOTIC ASSISTED LAPAROSCOPIC RADICAL PROSTATECTOMY;  Surgeon: Ardis Hughs, MD;  Location: [facility];  Service: Urology;  Laterality: N/A;   Family History  Problem Relation Age of Onset   Coronary artery  disease Father    Heart attack Father        2nd one fatal   Pulmonary embolism Father        ?   Dementia Father    Heart disease Father    Colon cancer Father    Dementia Mother    Hypertension Mother    Macular degeneration Mother    Kidney disease Mother    Diabetes Sister    Non-Hodgkin's lymphoma Sister    Colon cancer Maternal Grandfather    Pancreatic cancer Sister    Prostate cancer Neg Hx    Rectal cancer Neg Hx    Colon polyps Neg Hx    Esophageal cancer Neg Hx    Social History   Socioeconomic History   Marital status: Married    Spouse name: Not on file   Number of children: 2   Years of education: Not on file   Highest education level: 12th grade  Occupational History   Occupation: Retired - Chemical engineer: [employer]    Comment: [employer], trained as   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 2 sons - '79, '80; 1 grandson. Things are ok. Work is OK - Dance movement psychotherapist.    Fun/Hobby: Works at [employer] on a regular basis   Social Determinants of Radio broadcast assistant Strain: Low Risk    Difficulty of Paying Living Expenses: Not hard at all  Food Insecurity: No Food Insecurity   Worried About Charity fundraiser in the Last Year: Never true   Arboriculturist in the Last Year: Never true  Transportation Needs: No Transportation Needs   Lack of Transportation (Medical): No   Lack of Transportation (Non-Medical): No   Physical Activity: Sufficiently Active   Days of Exercise per Week: 7 days   Minutes of Exercise per Session: 30 min  Stress: No Stress Concern Present   Feeling of Stress : Not at all  Social Connections: Socially Integrated   Frequency of Communication with Friends and Family: More than three times a week   Frequency of Social Gatherings with Friends and Family: More than three times a week   Attends Religious Services: More than 4 times per year   Active Member of [employer] or Organizations: Yes   Attends Music therapist: More than 4 times per year   Marital Status: Married    Tobacco Counseling  Counseling given: Not Answered   Clinical Intake:  Pre-visit preparation completed: Yes  Pain : No/denies pain Pain Score: 0-No pain     BMI - recorded: 36.67 Nutritional Status: BMI > 30  Obese Nutritional Risks: None Diabetes: No  How often do you need to have someone help you when you read instructions, pamphlets, or other written materials from your doctor or pharmacy?: 1 - Never What is the last grade level you completed in school?: High School Graduate  Diabetic? no  Interpreter Needed?: No  Information entered by :: Lisette Abu, LPN   Activities of Daily Living In your present state of health, do you have any difficulty performing the following activities: 09/01/2020  Hearing? N  Vision? N  Difficulty concentrating or making decisions? N  Walking or climbing stairs? N  Dressing or bathing? N  Doing errands, shopping? N  Preparing Food and eating ? N  Using the Toilet? N  In the past six months, have you accidently leaked urine? N  Do you have problems with loss of bowel control? N  Managing your Medications? N  Managing your Finances? N  Housekeeping or managing your Housekeeping? N  Some recent data might be hidden    Patient Care Team: Marrian Salvage, [place] as PCP - General (Internal Medicine) Ardis Hughs, MD as Attending  Physician (Urology) Tyler Pita, MD as Consulting Physician (Radiation Oncology) Gregor Hams, MD as Consulting Physician (Family Medicine)  Indicate any recent Medical Services you may have received from other than [facility] providers in the past year (date may be approximate).     Assessment:   This is a routine wellness examination for [place].  Hearing/Vision screen No exam data present  Dietary issues and exercise activities discussed: Current Exercise Habits: Home exercise routine, Type of exercise: walking, Time (Minutes): 30, Frequency (Times/Week): 7, Weekly Exercise (Minutes/Week): 210, Intensity: Moderate, Exercise limited by: None identified  Goals      Patient Stated     Lose weight by monitoring my diet more closely and increase my physical activity.     Patient Stated     Lose around 10 pounds and continue to be active.       Depression Screen PHQ 2/9 Scores 09/01/2020 09/01/2019 07/23/2018 01/17/2017 10/18/2015  PHQ - 2 Score 0 0 0 0 0    Fall Risk Fall Risk  09/01/2020 09/01/2019 07/23/2018 02/22/2018 01/17/2017  Falls in the past year? 0 0 0 No No  Number falls in past yr: 0 0 0 - -  Injury with Fall? 0 0 - - -  Risk for fall due to : No Fall Risks No Fall Risks - - -  Follow up Falls evaluation completed Falls evaluation completed;Education provided;Falls prevention discussed - - -    FALL RISK PREVENTION PERTAINING TO THE HOME:  Any stairs in or around the home? No  If so, are there any without handrails? No  Home free of loose throw rugs in walkways, pet beds, electrical cords, etc? Yes  Adequate lighting in your home to reduce risk of falls? Yes   ASSISTIVE DEVICES UTILIZED TO PREVENT FALLS:  Life alert? No  Use of a cane, walker or w/c? No  Grab bars in the bathroom? No  Shower chair or bench in shower? No  Elevated toilet seat or a handicapped toilet? No   TIMED UP AND GO:  Was the test performed? No .  Length of time to ambulate 10 feet: 0 sec.  Gait steady and fast without use of assistive device  Cognitive Function: Normal cognitive status assessed by direct observation by this Nurse Health Advisor. No abnormalities found.       6CIT Screen 09/01/2019  What Year? 0 points  What month? 0 points  What time? 0 points  Count back from 20 0 points  Months in reverse 0 points  Repeat phrase 0 points  Total Score 0    Immunizations Immunization History  Administered Date(s) Administered   Influenza, High Dose Seasonal PF 01/17/2017   PFIZER(Purple Top)SARS-COV-2 Vaccination 07/19/2019, 08/09/2019   Pneumococcal Conjugate-13 06/23/2013   Pneumococcal Polysaccharide-23 03/20/2012, 07/23/2018   Td 09/20/2007   Tdap 02/22/2018    TDAP status: Up to date  Flu Vaccine status: Declined, Education has been provided regarding the importance of this vaccine but patient still declined. Advised may receive this vaccine at local pharmacy or Health Dept. Aware to provide a copy of the vaccination record if obtained from local pharmacy or Health Dept. Verbalized acceptance and understanding.  Pneumococcal vaccine status: Up to date  Covid-19 vaccine status: Completed vaccines  Qualifies for Shingles Vaccine? Yes   Zostavax completed No   Shingrix Completed?: No.    Education has been provided regarding the importance of this vaccine. Patient has been advised to call insurance company to determine out of pocket expense if they have not yet received this vaccine. Advised may also receive vaccine at local pharmacy or Health Dept. Verbalized acceptance and understanding.  Screening Tests Health Maintenance  Topic Date Due   COLONOSCOPY (Pts 45-41yrs Insurance coverage will need to be confirmed)  03/27/2020   COVID-19 Vaccine (4 - Booster for Pfizer series) 09/03/2020   INFLUENZA VACCINE  12/13/2020   TETANUS/TDAP  02/23/2028   Hepatitis C Screening  Completed   PNA vac Low Risk Adult  Completed   HPV VACCINES  Aged Out     Health Maintenance  Health Maintenance Due  Topic Date Due   COLONOSCOPY (Pts 45-60yrs Insurance coverage will need to be confirmed)  03/27/2020   COVID-19 Vaccine (4 - Booster for [place] series) 09/03/2020    Colorectal cancer screening: Type of screening: Colonoscopy. Completed 03/27/2017. Repeat every 3 years  (sheduled for 09/09/2020)  Lung Cancer Screening: (Low Dose CT Chest recommended if Age 62-80 years, 30 pack-year currently smoking OR have quit w/in 15years.) does qualify.   Lung Cancer Screening Referral: no  Additional Screening:  Hepatitis C Screening: does qualify; Completed yes  Vision Screening: Recommended annual ophthalmology exams for early detection of glaucoma and other disorders of the eye. Is the patient up to date with their annual eye exam?  Yes  Who is the provider or what is the name of the office in which the patient attends annual eye exams? Teodoro Spray, OD. If pt is not established with a provider, would they like to be referred to a provider to establish care? No .   Dental Screening: Recommended annual dental exams for proper oral hygiene  Community Resource Referral / Chronic Care Management: CRR required this visit?  No   CCM required this visit?  No      Plan:     I have personally reviewed and noted the following in the patient's chart:   Medical and social history Use of alcohol, tobacco or illicit drugs  Current medications and supplements Functional ability and status Nutritional status Physical activity Advanced directives List of other physicians Hospitalizations, surgeries, and ER visits in previous 12 months Vitals Screenings  to include cognitive, depression, and falls Referrals and appointments  In addition, I have reviewed and discussed with patient certain preventive protocols, quality metrics, and best practice recommendations. A written personalized care plan for preventive services as well as general preventive  health recommendations were provided to patient.     Sheral Flow, LPN   11/30/5972   Nurse Notes:  Medications reviewed with patient; no opioid use noted.  Medical screening examination/treatment/procedure(s) were performed by non-physician practitioner and as supervising provider I was immediately available for consultation/collaboration.  I agree with above. Marrian Salvage, FNP

## 2020-09-01 NOTE — Patient Instructions (Addendum)
[name] , Thank you for taking time to come for your Medicare Wellness Visit. I appreciate your ongoing commitment to your health goals. Please review the following plan we discussed and let me know if I can assist you in the future.   Screening recommendations/referrals: Colonoscopy: 03/27/2017; due every 3 years (scheuled for 09/09/2020) Recommended yearly ophthalmology/optometry visit for glaucoma screening and checkup Recommended yearly dental visit for hygiene and checkup  Vaccinations: Influenza vaccine: declined Pneumococcal vaccine: 07/23/2018, 06/23/2013 Tdap vaccine: 03/02/2018; due every 10 years Shingles vaccine:  Never done Covid-19: Please call your insurance company to determine your out of pocket expense for the Shingrix vaccine. You may receive this vaccine at your local pharmacy.  Advanced directives: Please bring a copy of your health care power of attorney and living will to the office at your convenience.  Goals identified: Recommend to drink at least 6-8 8oz glasses of water per day.   Recommend to exercise for at least 150 minutes per week.   Recommend to remove any items from the home that may cause slips or trips.   Recommend to decrease portion sizes by eating 3 small healthy meals and at least 2 healthy snacks per day.   Recommend to begin DASH diet as directed below   Recommend to continue efforts to reduce smoking habits until no longer smoking. Smoking Cessation literature is attached below.  Next appointment: Please schedule your next Medicare Wellness Visit with your Nurse Health Advisor in 1 year by calling [phone].  Preventive Care 31 Years and Older, Male Preventive care refers to lifestyle choices and visits with your health care provider that can promote health and wellness. What does preventive care include?  A yearly physical exam. This is also called an annual well check.  Dental exams once or twice a year.  Routine eye exams. Ask  your health care provider how often you should have your eyes checked.  Personal lifestyle choices, including:  Daily care of your teeth and gums.  Regular physical activity.  Eating a healthy diet.  Avoiding tobacco and drug use.  Limiting alcohol use.  Practicing safe sex.  Taking low doses of aspirin every day.  Taking vitamin and mineral supplements as recommended by your health care provider. What happens during an annual well check? The services and screenings done by your health care provider during your annual well check will depend on your age, overall health, lifestyle risk factors, and family history of disease. Counseling  Your health care provider may ask you questions about your:  Alcohol use.  Tobacco use.  Drug use.  Emotional well-being.  Home and relationship well-being.  Sexual activity.  Eating habits.  History of falls.  Memory and ability to understand (cognition).  Work and work Statistician. Screening  You may have the following tests or measurements:  Height, weight, and BMI.  Blood pressure.  Lipid and cholesterol levels. These may be checked every 5 years, or more frequently if you are over 48 years old.  Skin check.  Lung cancer screening. You may have this screening every year starting at age 58 if you have a 30-pack-year history of smoking and currently smoke or have quit within the past 15 years.  Fecal occult blood test (FOBT) of the stool. You may have this test every year starting at age 52.  Flexible sigmoidoscopy or colonoscopy. You may have a sigmoidoscopy every 5 years or a colonoscopy every 10 years starting at age 68.  Prostate cancer screening. Recommendations will  vary depending on your family history and other risks.  Hepatitis C blood test.  Hepatitis B blood test.  Sexually transmitted disease (STD) testing.  Diabetes screening. This is done by checking your blood sugar (glucose) after you have not eaten  for a while (fasting). You may have this done every 1-3 years.  Abdominal aortic aneurysm (AAA) screening. You may need this if you are a current or former smoker.  Osteoporosis. You may be screened starting at age 22 if you are at high risk. Talk with your health care provider about your test results, treatment options, and if necessary, the need for more tests. Vaccines  Your health care provider may recommend certain vaccines, such as:  Influenza vaccine. This is recommended every year.  Tetanus, diphtheria, and acellular pertussis (Tdap, Td) vaccine. You may need a Td booster every 10 years.  Zoster vaccine. You may need this after age 6.  Pneumococcal 13-valent conjugate (PCV13) vaccine. One dose is recommended after age 96.  Pneumococcal polysaccharide (PPSV23) vaccine. One dose is recommended after age 27. Talk to your health care provider about which screenings and vaccines you need and how often you need them. This information is not intended to replace advice given to you by your health care provider. Make sure you discuss any questions you have with your health care provider. Document Released: 05/28/2015 Document Revised: 01/19/2016 Document Reviewed: 03/02/2015 Elsevier Interactive Patient Education  2017 [place] Prevention in the Home Falls can cause injuries. They can happen to people of all ages. There are many things you can do to make your home safe and to help prevent falls. What can I do on the outside of my home?  Regularly fix the edges of walkways and driveways and fix any cracks.  Remove anything that might make you trip as you walk through a door, such as a raised step or threshold.  Trim any bushes or trees on the path to your home.  Use bright outdoor lighting.  Clear any walking paths of anything that might make someone trip, such as rocks or tools.  Regularly check to see if handrails are loose or broken. Make sure that both sides of any  steps have handrails.  Any raised decks and porches should have guardrails on the edges.  Have any leaves, snow, or ice cleared regularly.  Use sand or salt on walking paths during winter.  Clean up any spills in your garage right away. This includes oil or grease spills. What can I do in the bathroom?  Use night lights.  Install grab bars by the toilet and in the tub and shower. Do not use towel bars as grab bars.  Use non-skid mats or decals in the tub or shower.  If you need to sit down in the shower, use a plastic, non-slip stool.  Keep the floor dry. Clean up any water that spills on the floor as soon as it happens.  Remove soap buildup in the tub or shower regularly.  Attach bath mats securely with double-sided non-slip rug tape.  Do not have throw rugs and other things on the floor that can make you trip. What can I do in the bedroom?  Use night lights.  Make sure that you have a light by your bed that is easy to reach.  Do not use any sheets or blankets that are too big for your bed. They should not hang down onto the floor.  Have a firm chair that has  side arms. You can use this for support while you get dressed.  Do not have throw rugs and other things on the floor that can make you trip. What can I do in the kitchen?  Clean up any spills right away.  Avoid walking on wet floors.  Keep items that you use a lot in easy-to-reach places.  If you need to reach something above you, use a strong step stool that has a grab bar.  Keep electrical cords out of the way.  Do not use floor polish or wax that makes floors slippery. If you must use wax, use non-skid floor wax.  Do not have throw rugs and other things on the floor that can make you trip. What can I do with my stairs?  Do not leave any items on the stairs.  Make sure that there are handrails on both sides of the stairs and use them. Fix handrails that are broken or loose. Make sure that handrails are  as long as the stairways.  Check any carpeting to make sure that it is firmly attached to the stairs. Fix any carpet that is loose or worn.  Avoid having throw rugs at the top or bottom of the stairs. If you do have throw rugs, attach them to the floor with carpet tape.  Make sure that you have a light switch at the top of the stairs and the bottom of the stairs. If you do not have them, ask someone to add them for you. What else can I do to help prevent falls?  Wear shoes that:  Do not have high heels.  Have rubber bottoms.  Are comfortable and fit you well.  Are closed at the toe. Do not wear sandals.  If you use a stepladder:  Make sure that it is fully opened. Do not climb a closed stepladder.  Make sure that both sides of the stepladder are locked into place.  Ask someone to hold it for you, if possible.  Clearly mark and make sure that you can see:  Any grab bars or handrails.  First and last steps.  Where the edge of each step is.  Use tools that help you move around (mobility aids) if they are needed. These include:  Canes.  Walkers.  Scooters.  Crutches.  Turn on the lights when you go into a dark area. Replace any light bulbs as soon as they burn out.  Set up your furniture so you have a clear path. Avoid moving your furniture around.  If any of your floors are uneven, fix them.  If there are any pets around you, be aware of where they are.  Review your medicines with your doctor. Some medicines can make you feel dizzy. This can increase your chance of falling. Ask your doctor what other things that you can do to help prevent falls. This information is not intended to replace advice given to you by your health care provider. Make sure you discuss any questions you have with your health care provider. Document Released: 02/25/2009 Document Revised: 10/07/2015 Document Reviewed: 06/05/2014 Elsevier Interactive Patient Education  2017 [employer].

## 2020-09-07 ENCOUNTER — Other Ambulatory Visit: Payer: Self-pay

## 2020-09-07 ENCOUNTER — Other Ambulatory Visit: Payer: Self-pay | Admitting: Family

## 2020-09-07 ENCOUNTER — Encounter: Payer: Self-pay | Admitting: Family

## 2020-09-07 ENCOUNTER — Ambulatory Visit (INDEPENDENT_AMBULATORY_CARE_PROVIDER_SITE_OTHER): Payer: Medicare Other | Admitting: Family

## 2020-09-07 VITALS — BP 120/60 | HR 70 | Temp 98.4°F

## 2020-09-07 DIAGNOSIS — E785 Hyperlipidemia, unspecified: Secondary | ICD-10-CM

## 2020-09-07 DIAGNOSIS — I10 Essential (primary) hypertension: Secondary | ICD-10-CM

## 2020-09-07 LAB — CBC WITH DIFFERENTIAL/PLATELET
Basophils Absolute: 0 10*3/uL (ref 0.0–0.1)
Basophils Relative: 0.6 % (ref 0.0–3.0)
Eosinophils Absolute: 0.4 10*3/uL (ref 0.0–0.7)
Eosinophils Relative: 6.7 % — ABNORMAL HIGH (ref 0.0–5.0)
HCT: 42.3 % (ref 39.0–52.0)
Hemoglobin: 14.4 g/dL (ref 13.0–17.0)
Lymphocytes Relative: 13.9 % (ref 12.0–46.0)
Lymphs Abs: 0.8 10*3/uL (ref 0.7–4.0)
MCHC: 34.1 g/dL (ref 30.0–36.0)
MCV: 88.2 fl (ref 78.0–100.0)
Monocytes Absolute: 0.6 10*3/uL (ref 0.1–1.0)
Monocytes Relative: 10.8 % (ref 3.0–12.0)
Neutro Abs: 3.9 10*3/uL (ref 1.4–7.7)
Neutrophils Relative %: 68 % (ref 43.0–77.0)
Platelets: 247 10*3/uL (ref 150.0–400.0)
RBC: 4.8 Mil/uL (ref 4.22–5.81)
RDW: 13.7 % (ref 11.5–15.5)
WBC: 5.7 10*3/uL (ref 4.0–10.5)

## 2020-09-07 LAB — COMPREHENSIVE METABOLIC PANEL
ALT: 14 U/L (ref 0–53)
AST: 12 U/L (ref 0–37)
Albumin: 4.3 g/dL (ref 3.5–5.2)
Alkaline Phosphatase: 78 U/L (ref 39–117)
BUN: 25 mg/dL — ABNORMAL HIGH (ref 6–23)
CO2: 33 mEq/L — ABNORMAL HIGH (ref 19–32)
Calcium: 9.8 mg/dL (ref 8.4–10.5)
Chloride: 97 mEq/L (ref 96–112)
Creatinine, Ser: 1.08 mg/dL (ref 0.40–1.50)
GFR: 69.6 mL/min (ref >60.00)
Glucose, Bld: 109 mg/dL — ABNORMAL HIGH (ref 70–99)
Potassium: 4.1 mEq/L (ref 3.5–5.1)
Sodium: 137 mEq/L (ref 135–145)
Total Bilirubin: 0.5 mg/dL (ref 0.2–1.2)
Total Protein: 6.7 g/dL (ref 6.0–8.3)

## 2020-09-07 LAB — LIPID PANEL
Cholesterol: 115 mg/dL (ref 0–200)
HDL: 48 mg/dL (ref >39.00)
LDL Cholesterol: 38 mg/dL (ref 0–99)
NonHDL: 66.66
Total CHOL/HDL Ratio: 2
Triglycerides: 144 mg/dL (ref 0.0–149.0)
VLDL: 28.8 mg/dL (ref 0.0–40.0)

## 2020-09-07 MED ORDER — ROSUVASTATIN CALCIUM 5 MG PO TABS: 5.0000 mg | ORAL_TABLET | Freq: Every day | ORAL | 3 refills | Status: DC

## 2020-09-07 MED ORDER — LOSARTAN POTASSIUM-HCTZ 100-25 MG PO TABS: 1.0000 | ORAL_TABLET | Freq: Every day | ORAL | 3 refills | Status: DC

## 2020-09-07 NOTE — Progress Notes (Signed)
Luis Knapp is a 70 y.o. male with the following history as recorded in EpicCare:  Patient Active Problem List   Diagnosis Date Noted  . Rash 04/17/2017  . Prostate cancer ([place]) 11/01/2016  . Medicare annual wellness visit, initial 10/18/2015  . Hydrocele, right 06/24/2013  . Routine general medical examination at a health care facility 12/04/2010  . OBESITY, CLASS II 05/18/2010  . SCIATICA 05/18/2010  . POLYP, COLON 08/03/2007  . Essential hypertension 08/03/2007  . DIVERTICULOSIS, COLON 08/03/2007    Current Outpatient Medications  Medication Sig Dispense Refill  . aspirin EC 81 MG tablet Take 81 mg by mouth daily.    . calcium carbonate (TUMS - DOSED IN MG ELEMENTAL CALCIUM) 500 MG chewable tablet Chew 1 tablet by mouth 3 (three) times daily as needed for indigestion or heartburn.     . Cholecalciferol (VITAMIN D-3) 1000 units CAPS Take 1 capsule by mouth daily.    Marland Kitchen ibuprofen (ADVIL,MOTRIN) 200 MG tablet Take 400 mg by mouth every 6 (six) hours as needed.    . Multiple Vitamins-Minerals (MULTIVITAMIN ADULT PO) Take 1 tablet by mouth daily.    . Sodium Sulfate-Mag Sulfate-KCl (SUTAB) [phone] MG TABS Take 24 tablets by mouth as directed. MANUFACTURER CODES!! BIN: [id number] PCN: CN GROUP: [id number] MEMBER ID: [id number];[employer] AS SECONDARY INSURANCE ;NO PRIOR AUTHORIZATION 24 tablet 0  . vitamin B-12 (CYANOCOBALAMIN) 250 MCG tablet Take 250 mcg by mouth daily.    Marland Kitchen losartan-hydrochlorothiazide (HYZAAR) 100-25 MG tablet Take 1 tablet by mouth daily. 90 tablet 3  . rosuvastatin (CRESTOR) 5 MG tablet Take 1 tablet (5 mg total) by mouth daily. 90 tablet 3   No current facility-administered medications for this visit.    Allergies: Patient has no known allergies.  Past Medical History:  Diagnosis Date  . Allergic rhinitis due to pollen   . Allergy   . Diverticulosis of colon (without mention of hemorrhage)   . GERD (gastroesophageal reflux disease)   . History of colon polyps     2008--  PRE CANEROUS/   2013-- ADENOMA  . Prostate cancer ([place]) 11/01/2016   prostate cancer  . [name]   . Unspecified essential hypertension   . Wears glasses     Past Surgical History:  Procedure Laterality Date  . COLONOSCOPY    . COLONOSCOPY WITH PROPOFOL  10-30-2011  &  2008   POLYPECTOMY  . HYDROCELE EXCISION Right 07/18/2013   Procedure: RIGHT HYDROCELECTOMY ADULT;  Surgeon: Ardis Hughs, MD;  Location: [facility];  Service: Urology;  Laterality: Right;  . LYMPH NODE DISSECTION Bilateral 11/01/2016   Procedure: BILATERAL PELVIC LYMPH NODE DISSECTION;  Surgeon: Ardis Hughs, MD;  Location: [facility];  Service: Urology;  Laterality: Bilateral;  . POLYPECTOMY    . ROBOT ASSISTED LAPAROSCOPIC RADICAL PROSTATECTOMY N/A 11/01/2016   Procedure: XI ROBOTIC ASSISTED LAPAROSCOPIC RADICAL PROSTATECTOMY;  Surgeon: Ardis Hughs, MD;  Location: [facility];  Service: Urology;  Laterality: N/A;    Family History  Problem Relation Age of Onset  . Coronary artery disease Father   . Heart attack Father        2nd one fatal  . Pulmonary embolism Father        ?  Marland Kitchen Dementia Father   . Heart disease Father   . Colon cancer Father   . Dementia Mother   . Hypertension Mother   . Macular degeneration Mother   . Kidney disease Mother   . Diabetes Sister   .  Non-Hodgkin's lymphoma Sister   . Colon cancer Maternal Grandfather   . Pancreatic cancer Sister   . Prostate cancer Neg Hx   . Rectal cancer Neg Hx   . Colon polyps Neg Hx   . Esophageal cancer Neg Hx     Social History   Tobacco Use  . Smoking status: Former Smoker    Packs/day: 3.00    Years: 25.00    Pack years: 75.00    Types: Cigarettes    Quit date: 10/16/1996    Years since quitting: 23.9  . Smokeless tobacco: Never Used  Substance Use Topics  . Alcohol use: No    Subjective:   Presents for yearly follow-up on hypertension/ hyperlipidemia; under care of oncology for prostate cancer;   In baseline state of health today; Continuing to work with urology- considering hormone treatments for management of prostate cancer; Has been trying to walk at least 30 minutes daily/ has been eating smaller portions- pleased to see that weight has come down;  Denies any chest pain, shortness of breath, blurred vision or headache Colonoscopy is scheduled for Thursday;     Objective:  Vitals:   09/07/20 0830  BP: 120/60  Pulse: 70  Temp: 98.4 F (36.9 C)  TempSrc: Oral  SpO2: 97%    General: Well developed, well nourished, in no acute distress  Skin : Warm and dry.  Head: Normocephalic and atraumatic  Lungs: Respirations unlabored; clear to auscultation bilaterally without wheeze, rales, rhonchi  CVS exam: normal rate and regular rhythm.  Musculoskeletal: No deformities; no active joint inflammation  Extremities: No edema, cyanosis, clubbing  Vessels: Symmetric bilaterally  Neurologic: Alert and oriented; speech intact; face symmetrical; moves all extremities well; CNII-XII intact without focal deficit   Assessment:  1. Essential hypertension   2. Hyperlipidemia, unspecified hyperlipidemia type     Plan:  Labs and refills updated; he will plan for his 2nd COVID booster in the next few weeks; Congratulated patient on weight loss;  Follow up in 1 year, sooner prn.  This visit occurred during the SARS-CoV-2 public health emergency.  Safety protocols were in place, including screening questions prior to the visit, additional usage of staff PPE, and extensive cleaning of exam room while observing appropriate contact time as indicated for disinfecting solutions.      No follow-ups on file.  Orders Placed This Encounter  Procedures  . CBC with Differential/Platelet  . Comp Met (CMET)  . Lipid panel    Requested Prescriptions   Signed Prescriptions Disp Refills  . losartan-hydrochlorothiazide (HYZAAR) 100-25 MG tablet 90 tablet 3    Sig: Take 1 tablet by mouth daily.   . rosuvastatin (CRESTOR) 5 MG tablet 90 tablet 3    Sig: Take 1 tablet (5 mg total) by mouth daily.

## 2020-09-08 ENCOUNTER — Encounter: Payer: Self-pay | Admitting: Family

## 2020-09-09 ENCOUNTER — Ambulatory Visit (AMBULATORY_SURGERY_CENTER): Payer: Medicare Other | Admitting: Gastroenterology

## 2020-09-09 ENCOUNTER — Encounter: Payer: Self-pay | Admitting: Gastroenterology

## 2020-09-09 ENCOUNTER — Other Ambulatory Visit: Payer: Self-pay

## 2020-09-09 VITALS — BP 105/61 | HR 63 | Temp 98.1°F | Resp 14 | Ht 66.0 in | Wt 233.0 lb

## 2020-09-09 DIAGNOSIS — Z86010 Personal history of colonic polyps: Secondary | ICD-10-CM

## 2020-09-09 DIAGNOSIS — K635 Polyp of colon: Secondary | ICD-10-CM

## 2020-09-09 DIAGNOSIS — Z1211 Encounter for screening for malignant neoplasm of colon: Secondary | ICD-10-CM | POA: Diagnosis not present

## 2020-09-09 DIAGNOSIS — D120 Benign neoplasm of cecum: Secondary | ICD-10-CM

## 2020-09-09 MED ORDER — SODIUM CHLORIDE 0.9 % IV SOLN: 500.0000 mL | Freq: Once | INTRAVENOUS | Status: DC

## 2020-09-09 NOTE — Progress Notes (Signed)
Called to room to assist during endoscopic procedure.  Patient ID and intended procedure confirmed with present staff. Received instructions for my participation in the procedure from the performing physician.  

## 2020-09-09 NOTE — Progress Notes (Signed)
Medical history reviewed with no changes since PV. VS assessed by C.W 

## 2020-09-09 NOTE — Progress Notes (Signed)
A/ox3, pleased with MAC, report to RN 

## 2020-09-09 NOTE — Op Note (Signed)
[facility] Patient Name: Luis Knapp Procedure Date: 09/09/2020 7:58 AM MRN: [id number] Endoscopist: Remo Lipps P. Havery Moros , MD Age: 70 Referring MD:  Date of Birth: [birth year] Gender: Male Account #: [id number] Procedure:                Colonoscopy Indications:              High risk colon cancer surveillance: Personal                            history of colonic polyps (history of advanced                            adenoma removed in 03/2017) Medicines:                Monitored Anesthesia Care Procedure:                Pre-Anesthesia Assessment:                           - Prior to the procedure, a History and Physical                            was performed, and patient medications and                            allergies were reviewed. The patient's tolerance of                            previous anesthesia was also reviewed. The risks                            and benefits of the procedure and the sedation                            options and risks were discussed with the patient.                            All questions were answered, and informed consent                            was obtained. Prior Anticoagulants: The patient has                            taken no previous anticoagulant or antiplatelet                            agents. ASA Grade Assessment: II - A patient with                            mild systemic disease. After reviewing the risks                            and benefits, the patient was deemed in  satisfactory condition to undergo the procedure.                           After obtaining informed consent, the colonoscope                            was passed under direct vision. Throughout the                            procedure, the patient's blood pressure, pulse, and                            oxygen saturations were monitored continuously. The                            Olympus CF-[id number])  Colonoscope was                            introduced through the anus and advanced to the the                            cecum, identified by appendiceal orifice and                            ileocecal valve. The colonoscopy was performed                            without difficulty. The patient tolerated the                            procedure well. The quality of the bowel                            preparation was good. The ileocecal valve,                            appendiceal orifice, and rectum were photographed. Scope In: 8:05:19 AM Scope Out: 8:21:40 AM Scope Withdrawal Time: 0 hours 14 minutes 41 seconds  Total Procedure Duration: 0 hours 16 minutes 21 seconds  Findings:                 The perianal and digital rectal examinations were                            normal.                           A diminutive polyp was found in the cecum near the                            AO. The polyp was flat. The polyp was removed with                            a cold biopsy forceps. Resection and retrieval were  complete.                           A large post polypectomy scar was found in the                            ascending colon. The scar tissue was healthy in                            appearance. There was no evidence of the previous                            polyp.                           Multiple medium-mouthed diverticula were found in                            the entire colon.                           Internal hemorrhoids were found during                            retroflexion. The hemorrhoids were small.                           The exam was otherwise without abnormality. Complications:            No immediate complications. Estimated blood loss:                            Minimal. Estimated Blood Loss:     Estimated blood loss was minimal. Impression:               - One diminutive polyp in the cecum, removed with a                             cold biopsy forceps. Resected and retrieved.                           - Post-polypectomy scar in the ascending colon.                           - Diverticulosis in the entire examined colon.                           - Internal hemorrhoids.                           - The examination was otherwise normal. Recommendation:           - Patient has a contact number available for                            emergencies. The signs and symptoms of potential  delayed complications were discussed with the                            patient. Return to normal activities tomorrow.                            Written discharge instructions were provided to the                            patient.                           - Resume previous diet.                           - Continue present medications.                           - Await pathology results.                           - Repeat colonoscopy in 5 years given advanced                            adenoma removed 3 years ago [place]. Havery Moros, MD 09/09/2020 8:26:02 AM This report has been signed electronically.

## 2020-09-09 NOTE — Patient Instructions (Signed)
Handout on polyps, diverticulosis, and hemorrhoids given. ° °YOU HAD AN ENDOSCOPIC PROCEDURE TODAY AT THE [facility]:   Refer to the procedure report that was given to you for any specific questions about what was found during the examination.  If the procedure report does not answer your questions, please call your gastroenterologist to clarify.  If you requested that your care partner not be given the details of your procedure findings, then the procedure report has been included in a sealed envelope for you to review at your convenience later. ° °YOU SHOULD EXPECT: Some feelings of bloating in the abdomen. Passage of more gas than usual.  Walking can help get rid of the air that was put into your GI tract during the procedure and reduce the bloating. If you had a lower endoscopy (such as a colonoscopy or flexible sigmoidoscopy) you may notice spotting of blood in your stool or on the toilet paper. If you underwent a bowel prep for your procedure, you may not have a normal bowel movement for a few days. ° °Please Note:  You might notice some irritation and congestion in your nose or some drainage.  This is from the oxygen used during your procedure.  There is no need for concern and it should clear up in a day or so. ° °SYMPTOMS TO REPORT IMMEDIATELY: ° °Following lower endoscopy (colonoscopy or flexible sigmoidoscopy): ° Excessive amounts of blood in the stool ° Significant tenderness or worsening of abdominal pains ° Swelling of the abdomen that is new, acute ° Fever of 100°F or higher ° °For urgent or emergent issues, a gastroenterologist can be reached at any hour by calling [phone]. °Do not use MyChart messaging for urgent concerns.  ° ° °DIET:  We do recommend a small meal at first, but then you may proceed to your regular diet.  Drink plenty of fluids but you should avoid alcoholic beverages for 24 hours. ° °ACTIVITY:  You should plan to take it easy for the rest of today and you should  NOT DRIVE or use heavy machinery until tomorrow (because of the sedation medicines used during the test).   ° °FOLLOW UP: °Our staff will call the number listed on your records 48-72 hours following your procedure to check on you and address any questions or concerns that you may have regarding the information given to you following your procedure. If we do not reach you, we will leave a message.  We will attempt to reach you two times.  During this call, we will ask if you have developed any symptoms of COVID 19. If you develop any symptoms (ie: fever, flu-like symptoms, shortness of breath, cough etc.) before then, please call [phone].  If you test positive for Covid 19 in the 2 weeks post procedure, please call and report this information to us.   ° °If any biopsies were taken you will be contacted by phone or by letter within the next 1-3 weeks.  Please call us at [phone] if you have not heard about the biopsies in 3 weeks.  ° ° °SIGNATURES/CONFIDENTIALITY: °You and/or your care partner have signed paperwork which will be entered into your electronic medical record.  These signatures attest to the fact that that the information above on your After Visit Summary has been reviewed and is understood.  Full responsibility of the confidentiality of this discharge information lies with you and/or your care-partner.  °

## 2020-09-13 ENCOUNTER — Telehealth: Payer: Self-pay

## 2020-09-13 NOTE — Telephone Encounter (Signed)
.    Follow up Call-  Call back number 09/09/2020  Post procedure Call Back phone  # [phone]  Permission to leave phone message Yes  Some recent data might be hidden     Patient questions:  Do you have a fever, pain , or abdominal swelling? No. Pain Score  0 *  Have you tolerated food without any problems? Yes.    Have you been able to return to your normal activities? Yes.    Do you have any questions about your discharge instructions: Diet   No. Medications  No. Follow up visit  No.  Do you have questions or concerns about your Care? No.  Actions: * If pain score is 4 or above: No action needed, pain <4.  1. Have you developed a fever since your procedure? no  2.   Have you had an respiratory symptoms (SOB or cough) since your procedure? no  3.   Have you tested positive for COVID 19 since your procedure no  4.   Have you had any family members/close contacts diagnosed with the COVID 19 since your procedure?  no   If yes to any of these questions please route to Joylene John, RN and Joella Prince, RN

## 2020-09-20 DIAGNOSIS — Z8546 Personal history of malignant neoplasm of prostate: Secondary | ICD-10-CM | POA: Diagnosis not present

## 2020-09-27 DIAGNOSIS — N5231 Erectile dysfunction following radical prostatectomy: Secondary | ICD-10-CM | POA: Diagnosis not present

## 2020-09-27 DIAGNOSIS — N393 Stress incontinence (female) (male): Secondary | ICD-10-CM | POA: Diagnosis not present

## 2020-09-27 DIAGNOSIS — R9721 Rising PSA following treatment for malignant neoplasm of prostate: Secondary | ICD-10-CM | POA: Diagnosis not present

## 2020-09-29 ENCOUNTER — Other Ambulatory Visit (HOSPITAL_COMMUNITY): Payer: Self-pay | Admitting: Urology

## 2020-09-29 DIAGNOSIS — R9721 Rising PSA following treatment for malignant neoplasm of prostate: Secondary | ICD-10-CM

## 2020-10-15 ENCOUNTER — Other Ambulatory Visit: Payer: Self-pay

## 2020-10-15 ENCOUNTER — Ambulatory Visit (HOSPITAL_COMMUNITY)
Admission: RE | Admit: 2020-10-15 | Discharge: 2020-10-15 | Disposition: A | Discharge status: Home / Self Care | Payer: Medicare Other | Source: Ambulatory Visit | Attending: Urology | Admitting: Urology

## 2020-10-15 DIAGNOSIS — R911 Solitary pulmonary nodule: Secondary | ICD-10-CM | POA: Diagnosis not present

## 2020-10-15 DIAGNOSIS — R9721 Rising PSA following treatment for malignant neoplasm of prostate: Secondary | ICD-10-CM | POA: Diagnosis not present

## 2020-10-15 MED ORDER — PIFLIFOLASTAT F 18 (PYLARIFY) INJECTION
9.0000 | Freq: Once | INTRAVENOUS | Status: AC
  Administered 2020-10-15: 9.4 via INTRAVENOUS

## 2020-10-20 DIAGNOSIS — Z23 Encounter for immunization: Secondary | ICD-10-CM | POA: Diagnosis not present

## 2020-11-02 NOTE — Progress Notes (Signed)
I, Wendy Poet, LAT, ATC, am serving as scribe for Dr. Lynne Leader.  Luis Knapp is a 70 y.o. male who presents to [place] at [facility] today for L great toe pain and swelling.  He was last seen by Dr. Georgina Snell on 08/22/19 for R knee pain.  Since then, he reports L great toe and swelling w/ no known MOI x one week.  He locates his pain to his entire L great toe on the ant and med/lat toe.  He reports that his L great toe has been red and swollen.  No known history of gout.  Uric acid checked in April 2021 was 6.2.  Aggravating factors: walking; weight-bearing; pressure to the area Treatments tried: Voltaren gel   Pertinent review of systems: No fevers or chills  Relevant historical information: Hypertension managed with hydrochlorothiazide and losartan. History of prostate cancer.  Recent negative PET scan October 15, 2020  Exam:  BP 120/62 (BP Location: Left Arm, Patient Position: Sitting, Cuff Size: Large)   Pulse 72   Ht 5\' 6"  (1.676 m)   Wt 215 lb (97.5 kg)   SpO2 98%   BMI 34.70 kg/m  General: Well Developed, well nourished, and in no acute distress.   MSK: Left great toe erythematous and swollen at MTP and IP joint.  Decreased motion.  Tender palpation.  Pulses cap refill and sensation are intact distally.    Lab and Radiology Results  Diagnostic Limited MSK Ultrasound of: Left great toe Joint effusion and mild degenerative changes present at the MTP and IP joint.  Intact tendinous structures. Impression: Joint effusion MTP and IP joint   X-ray images left great toe obtained today personally and independently interpreted First MTP DJD.  No acute fractures.  No erosions. Await formal radiology review  Lab Results  Component Value Date   LABURIC 6.2 08/19/2019     Chemistry      Component Value Date/Time   NA 137 09/07/2020 0855   K 4.1 09/07/2020 0855   CL 97 09/07/2020 0855   CO2 33 (H) 09/07/2020 0855   BUN 25 (H) 09/07/2020 0855    CREATININE 1.08 09/07/2020 0855      Component Value Date/Time   CALCIUM 9.8 09/07/2020 0855   ALKPHOS 78 09/07/2020 0855   AST 12 09/07/2020 0855   ALT 14 09/07/2020 0855   BILITOT 0.5 09/07/2020 0855       Assessment and Plan: 70 y.o. male with left great toe pain and swelling ongoing for about 1 week.  This is very likely to be due to gout.  Patient did have mildly elevated uric acid about 14 months ago and is at risk for gout based on his other medical problems.  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  Plan to recheck uric acid and further work-up potential other causes of toe pain with x-ray and diagnostic ultrasound.  Plan to treat for gout with colchicine and Voltaren gel.  Additionally recommend postop shoe or even cam walker boot for comfort as needed.  If not doing well could prescribe steroids or even narcotics for pain.  Recheck in 1 month.  Ultimately could consider joint injection sooner than that if needed however patient would like to avoid steroid injection if possible.   PDMP not reviewed this encounter. Orders Placed This Encounter  Procedures   [place] LIMITED JOINT SPACE STRUCTURES LOW LEFT(NO LINKED CHARGES)    Order Specific Question:   Reason for Exam (SYMPTOM  OR DIAGNOSIS REQUIRED)  Answer:   L great toe pain    Order Specific Question:   Preferred imaging location?    Answer:   [facility]   DG Toe Great Left    Standing Status:   Future    Standing Expiration Date:   11/03/2021    Order Specific Question:   Reason for Exam (SYMPTOM  OR DIAGNOSIS REQUIRED)    Answer:   eval toe pain    Order Specific Question:   Preferred imaging location?    Answer:   [facility]   Uric acid    Standing Status:   Future    Number of Occurrences:   1    Standing Expiration Date:   11/03/2021   Meds ordered this encounter  Medications   colchicine 0.6 MG tablet    Sig: Take 1 tablet (0.6 mg total) by mouth daily as needed (gout or  psuedogout pain).    Dispense:  30 tablet    Refill:  2     Discussed warning signs or symptoms. Please see discharge instructions. Patient expresses understanding.   The above documentation has been reviewed and is accurate and complete Lynne Leader, M.D.

## 2020-11-03 ENCOUNTER — Ambulatory Visit: Payer: Self-pay

## 2020-11-03 ENCOUNTER — Ambulatory Visit (INDEPENDENT_AMBULATORY_CARE_PROVIDER_SITE_OTHER): Payer: Medicare Other

## 2020-11-03 ENCOUNTER — Encounter: Payer: Self-pay | Admitting: Family Medicine

## 2020-11-03 ENCOUNTER — Ambulatory Visit (INDEPENDENT_AMBULATORY_CARE_PROVIDER_SITE_OTHER): Payer: Medicare Other | Admitting: Family Medicine

## 2020-11-03 ENCOUNTER — Other Ambulatory Visit: Payer: Self-pay

## 2020-11-03 VITALS — BP 120/62 | HR 72 | Ht 66.0 in | Wt 215.0 lb

## 2020-11-03 DIAGNOSIS — M79675 Pain in left toe(s): Secondary | ICD-10-CM

## 2020-11-03 DIAGNOSIS — M19072 Primary osteoarthritis, left ankle and foot: Secondary | ICD-10-CM | POA: Diagnosis not present

## 2020-11-03 LAB — URIC ACID: Uric Acid, Serum: 6.3 mg/dL (ref 4.0–7.8)

## 2020-11-03 MED ORDER — COLCHICINE 0.6 MG PO TABS: 0.6000 mg | ORAL_TABLET | Freq: Every day | ORAL | 2 refills | Status: DC | PRN

## 2020-11-03 NOTE — Patient Instructions (Addendum)
Thank you for coming in today.   Please get labs today before you leave   Please get an Xray today before you leave   Take the colchicine daily or twice daily as needed for pain.   Use the post op shoe or a cam walker boot as needed.   Please go to [facility] supply to get the cam walker boot we talked about today. You may also be able to get it from [employer].   Please use Voltaren gel (Generic Diclofenac Gel) up to 4x daily for pain as needed.  This is available over-the-counter as both the name brand Voltaren gel and the generic diclofenac gel.   Let me know if this is miserable  Recheck in about 1 month.   Gout  Gout is a condition that causes painful swelling of the joints. Gout is a type of inflammation of the joints (arthritis). This condition is caused by having too much uric acid in the body. Uric acid is a chemical that forms when the body breaks down substances called purines.Purines are important for building body proteins. When the body has too much uric acid, sharp crystals can form and build up inside the joints. This causes pain and swelling. Gout attacks can happen quickly and may be very painful (acute gout). Over time, the attacks can affect more joints and become more frequent (chronic gout). Gout can also cause uric acid to build up under the skin and inside thekidneys. What are the causes? This condition is caused by too much uric acid in your blood. This can happen because: Your kidneys do not remove enough uric acid from your blood. This is the most common cause. Your body makes too much uric acid. This can happen with some cancers and cancer treatments. It can also occur if your body is breaking down too many red blood cells (hemolytic anemia). You eat too many foods that are high in purines. These foods include organ meats and some seafood. Alcohol, especially beer, is also high in purines. A gout attack may be triggered by trauma or stress. What increases the  risk? You are more likely to develop this condition if you: Have a family history of gout. Are male and middle-aged. Are male and have gone through menopause. Are obese. Frequently drink alcohol, especially beer. Are dehydrated. Lose weight too quickly. Have an organ transplant. Have lead poisoning. Take certain medicines, including aspirin, cyclosporine, diuretics, levodopa, and niacin. Have kidney disease. Have a skin condition called psoriasis. What are the signs or symptoms? An attack of acute gout happens quickly. It usually occurs in just one joint. The most common place is the big toe. Attacks often start at night. Other joints that may be affected include joints of the feet, ankle, knee, fingers, wrist, or elbow. Symptoms of this condition may include: Severe pain. Warmth. Swelling. Stiffness. Tenderness. The affected joint may be very painful to touch. Shiny, red, or purple skin. Chills and fever. Chronic gout may cause symptoms more frequently. More joints may be involved. You may also have white or yellow lumps (tophi) on your hands or feet or in other areas near your joints. How is this diagnosed? This condition is diagnosed based on your symptoms, medical history, and physical exam. You may have tests, such as: Blood tests to measure uric acid levels. Removal of joint fluid with a thin needle (aspiration) to look for uric acid crystals. X-rays to look for joint damage. How is this treated? Treatment for this condition  has two phases: treating an acute attack and preventing future attacks. Acute gout treatment may include medicines to reduce pain and swelling, including: NSAIDs. Steroids. These are strong anti-inflammatory medicines that can be taken by mouth (orally) or injected into a joint. Colchicine. This medicine relieves pain and swelling when it is taken soon after an attack. It can be given by mouth or through an IV. Preventive treatment may include: Daily  use of smaller doses of NSAIDs or colchicine. Use of a medicine that reduces uric acid levels in your blood. Changes to your diet. You may need to see a dietitian about what to eat and drink to prevent gout. Follow these instructions at home: During a gout attack  If directed, put ice on the affected area: Put ice in a plastic bag. Place a towel between your skin and the bag. Leave the ice on for 20 minutes, 2-3 times a day. Raise (elevate) the affected joint above the level of your heart as often as possible. Rest the joint as much as possible. If the affected joint is in your leg, you may be given crutches to use. Follow instructions from your health care provider about eating or drinking restrictions.  Avoiding future gout attacks Follow a low-purine diet as told by your dietitian or health care provider. Avoid foods and drinks that are high in purines, including liver, kidney, anchovies, asparagus, herring, mushrooms, mussels, and beer. Maintain a healthy weight or lose weight if you are overweight. If you want to lose weight, talk with your health care provider. It is important that you do not lose weight too quickly. Start or maintain an exercise program as told by your health care provider. Eating and drinking Drink enough fluids to keep your urine pale yellow. If you drink alcohol: Limit how much you use to: 0-1 drink a day for women. 0-2 drinks a day for men. Be aware of how much alcohol is in your drink. In the [place] one drink equals one 12 oz bottle of beer (355 mL) one 5 oz glass of wine (148 mL), or one 1 oz glass of hard liquor (44 mL). General instructions Take over-the-counter and prescription medicines only as told by your health care provider. Do not drive or use heavy machinery while taking prescription pain medicine. Return to your normal activities as told by your health care provider. Ask your health care provider what activities are safe for you. Keep all  follow-up visits as told by your health care provider. This is important. Contact a health care provider if you have: Another gout attack. Continuing symptoms of a gout attack after 10 days of treatment. Side effects from your medicines. Chills or a fever. Burning pain when you urinate. Pain in your lower back or belly. Get help right away if you: Have severe or uncontrolled pain. Cannot urinate. Summary Gout is painful swelling of the joints caused by inflammation. The most common site of pain is the big toe, but it can affect other joints in the body. Medicines and dietary changes can help to prevent and treat gout attacks. This information is not intended to replace advice given to you by your health care provider. Make sure you discuss any questions you have with your healthcare provider. Document Revised: 11/21/2017 Document Reviewed: 11/21/2017 Elsevier Patient Education  [place]. .

## 2020-11-03 NOTE — Progress Notes (Signed)
Uric acid is mildly elevated at 6.3.  Technically for gout we want the uric acid to be less than 6.0.  Take the colchicine and see how you do.

## 2020-11-04 NOTE — Progress Notes (Signed)
X-ray left great toe shows arthritis which may be the cause of pain.  If colchicine does not help let me know.

## 2020-12-02 NOTE — Progress Notes (Signed)
   Rito Ehrlich, am serving as a Education administrator for Dr. Lynne Leader.   Luis Knapp is a 70 y.o. male who presents to [place] at [facility] today for f/u of L great toe pain likely due to gout.  He was last seen by Dr. Georgina Snell on 11/03/20 and was prescribed colchicine and advised to use a post-op shoe for ambulation.  Since his last visit, pt reports that the L big to is almost back to normal and no worries about it. Patient is able to go on his daily walks again.  Diagnostic testing:  L great toe XR- 11/03/20   Pertinent review of systems: No fevers or chills  Relevant historical information: History of prostate cancer   Exam:  BP 120/60 (BP Location: Left Arm, Patient Position: Sitting, Cuff Size: Normal)   Pulse 67   Ht 5\' 6"  (1.676 m)   Wt 216 lb (98 kg)   SpO2 98%   BMI 34.86 kg/m  General: Well Developed, well nourished, and in no acute distress.   MSK: Normal gait    Lab and Radiology Results Lab Results  Component Value Date   LABURIC 6.3 11/03/2020    EXAM: LEFT GREAT TOE   COMPARISON:  None.   FINDINGS: No fracture or malalignment. 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 No acute osseous abnormality     Electronically Signed   By: Donavan Foil M.D.   On: 11/03/2020 22:41 I, Lynne Leader, personally (independently) visualized and performed the interpretation of the images attached in this note.     Assessment and Plan: 70 y.o. 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  Patient has done quite well with colchicine.  Uric acid is greater than goal of 6.0.  Plan to start allopurinol and prophylaxis colchicine for 1 to 3 months.  Recheck in 6 months.   PDMP not reviewed this encounter. No orders of the defined types were placed in this encounter.  Meds ordered this encounter   Medications   allopurinol (ZYLOPRIM) 100 MG tablet    Sig: Take 1 tablet (100 mg total) by mouth daily.    Dispense:  90 tablet    Refill:  1   colchicine 0.6 MG tablet    Sig: Take 1 tablet (0.6 mg total) by mouth daily as needed (gout or psuedogout pain).    Dispense:  30 tablet    Refill:  2     Discussed warning signs or symptoms. Please see discharge instructions. Patient expresses understanding.   The above documentation has been reviewed and is accurate and complete Lynne Leader, M.D.

## 2020-12-03 ENCOUNTER — Other Ambulatory Visit: Payer: Self-pay

## 2020-12-03 ENCOUNTER — Ambulatory Visit (INDEPENDENT_AMBULATORY_CARE_PROVIDER_SITE_OTHER): Payer: Medicare Other | Admitting: Family Medicine

## 2020-12-03 ENCOUNTER — Encounter: Payer: Self-pay | Admitting: Family Medicine

## 2020-12-03 VITALS — BP 120/60 | HR 67 | Ht 66.0 in | Wt 216.0 lb

## 2020-12-03 DIAGNOSIS — M79675 Pain in left toe(s): Secondary | ICD-10-CM | POA: Diagnosis not present

## 2020-12-03 MED ORDER — ALLOPURINOL 100 MG PO TABS: 100.0000 mg | ORAL_TABLET | Freq: Every day | ORAL | 1 refills | Status: DC

## 2020-12-03 MED ORDER — COLCHICINE 0.6 MG PO TABS: 0.6000 mg | ORAL_TABLET | Freq: Every day | ORAL | 2 refills | Status: DC | PRN

## 2020-12-03 NOTE — Patient Instructions (Addendum)
Thank you for coming in today.   Start allopurinol daily to lower uric acid and intimately prevent gout  Take colchicine daily for 1-3 months to prevent gout flair  Recheck in 6 months.   Let me know if you have a problem.   Use goodrx to get cost savings on generic medicines.

## 2021-02-27 ENCOUNTER — Other Ambulatory Visit: Payer: Self-pay | Admitting: Family Medicine

## 2021-02-28 NOTE — Telephone Encounter (Signed)
Rx refill request approved per Dr. Corey's orders. 

## 2021-03-11 DIAGNOSIS — Z23 Encounter for immunization: Secondary | ICD-10-CM | POA: Diagnosis not present

## 2021-04-27 IMAGING — DX DG KNEE COMPLETE 4+V*R*
4 series · 4 of 4 positions shown · non-contrast
Comparison: None.

CLINICAL DATA: Right knee pain.

EXAM:
RIGHT KNEE - COMPLETE 4+ VIEW

[knee ap]
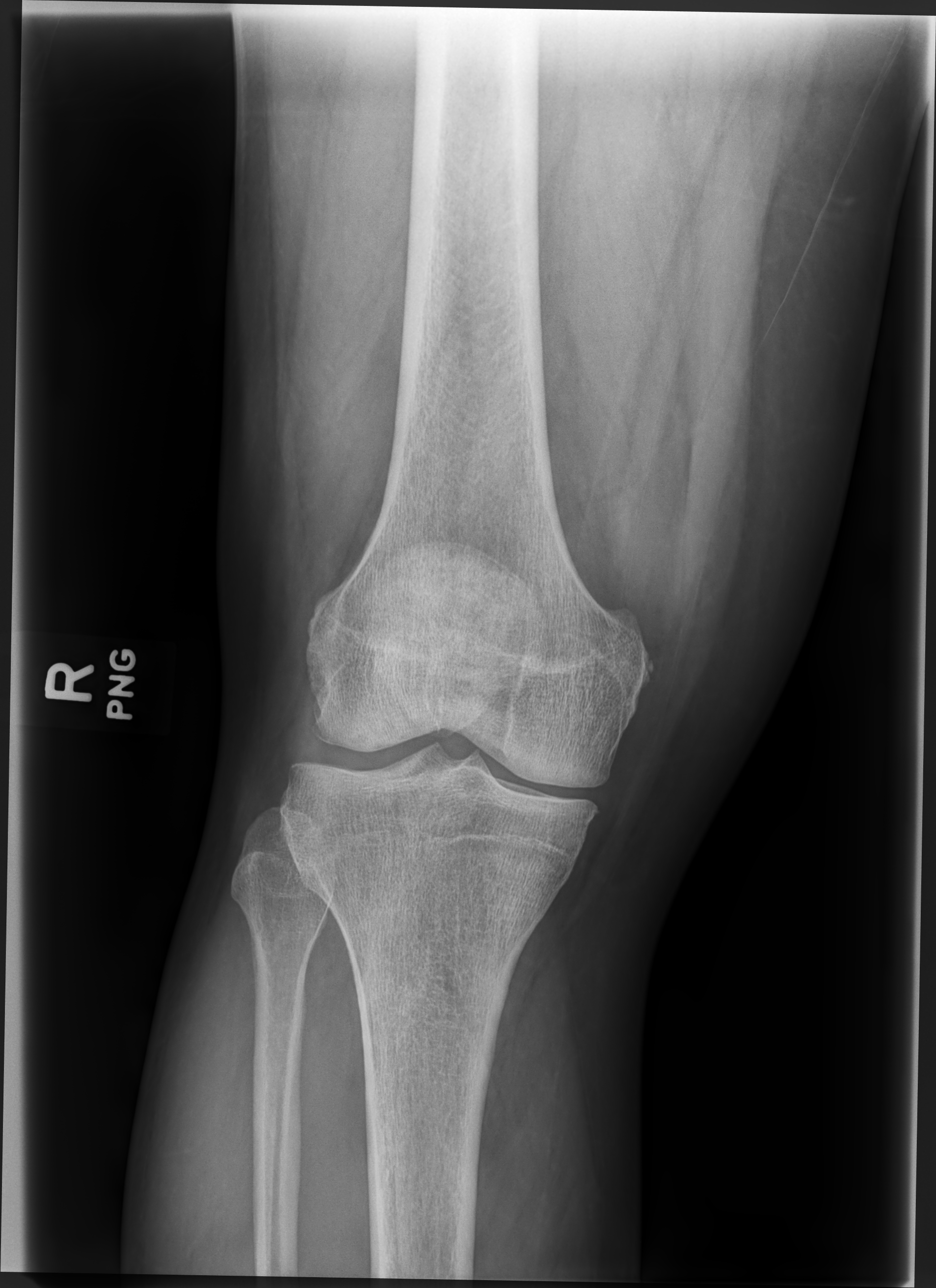

[knee lat]
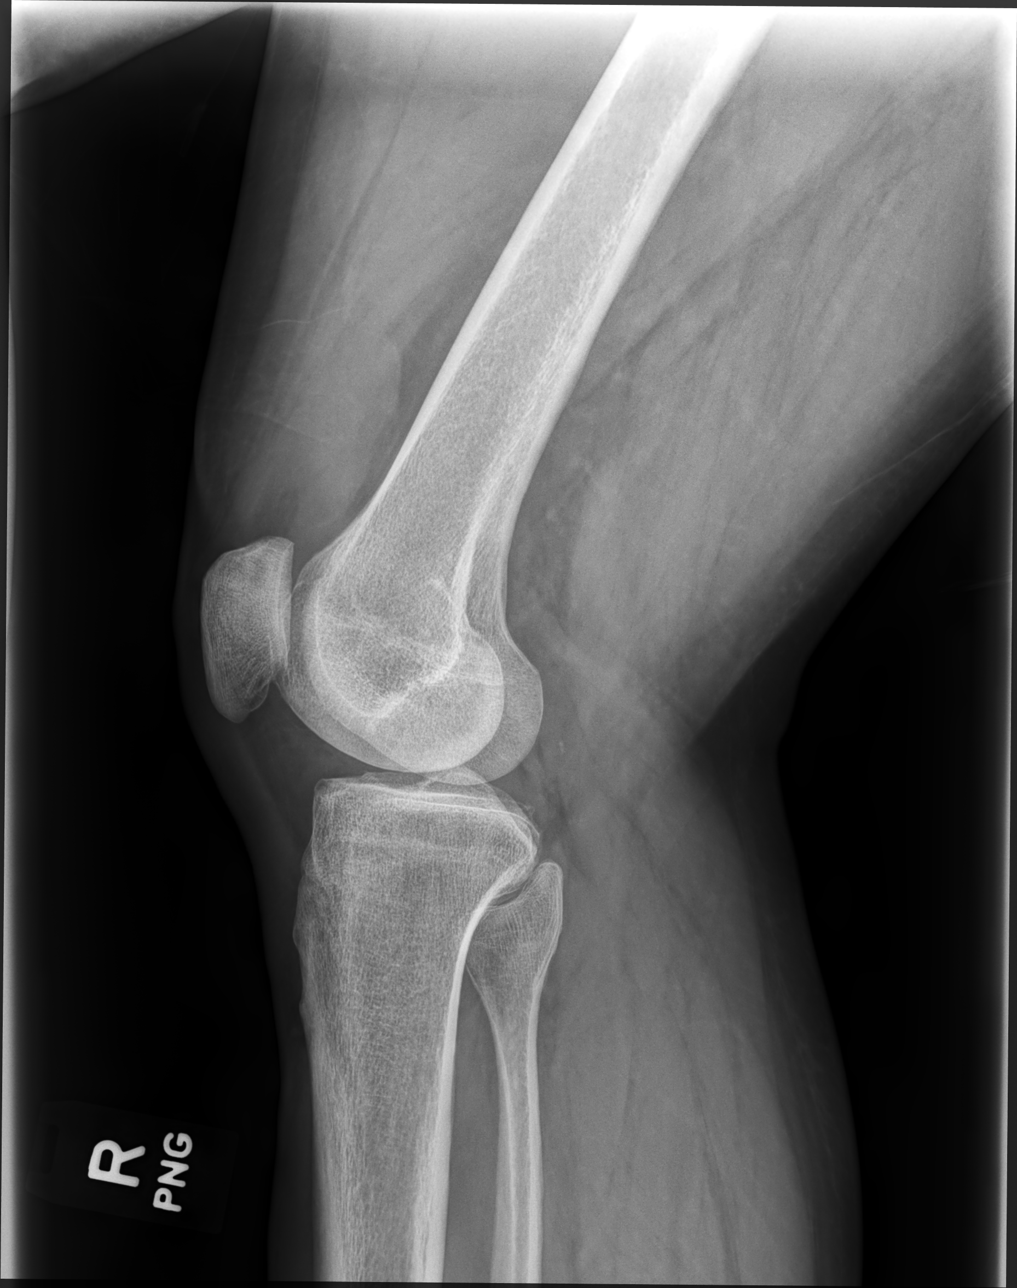

[knee mlo (1 of 2)]
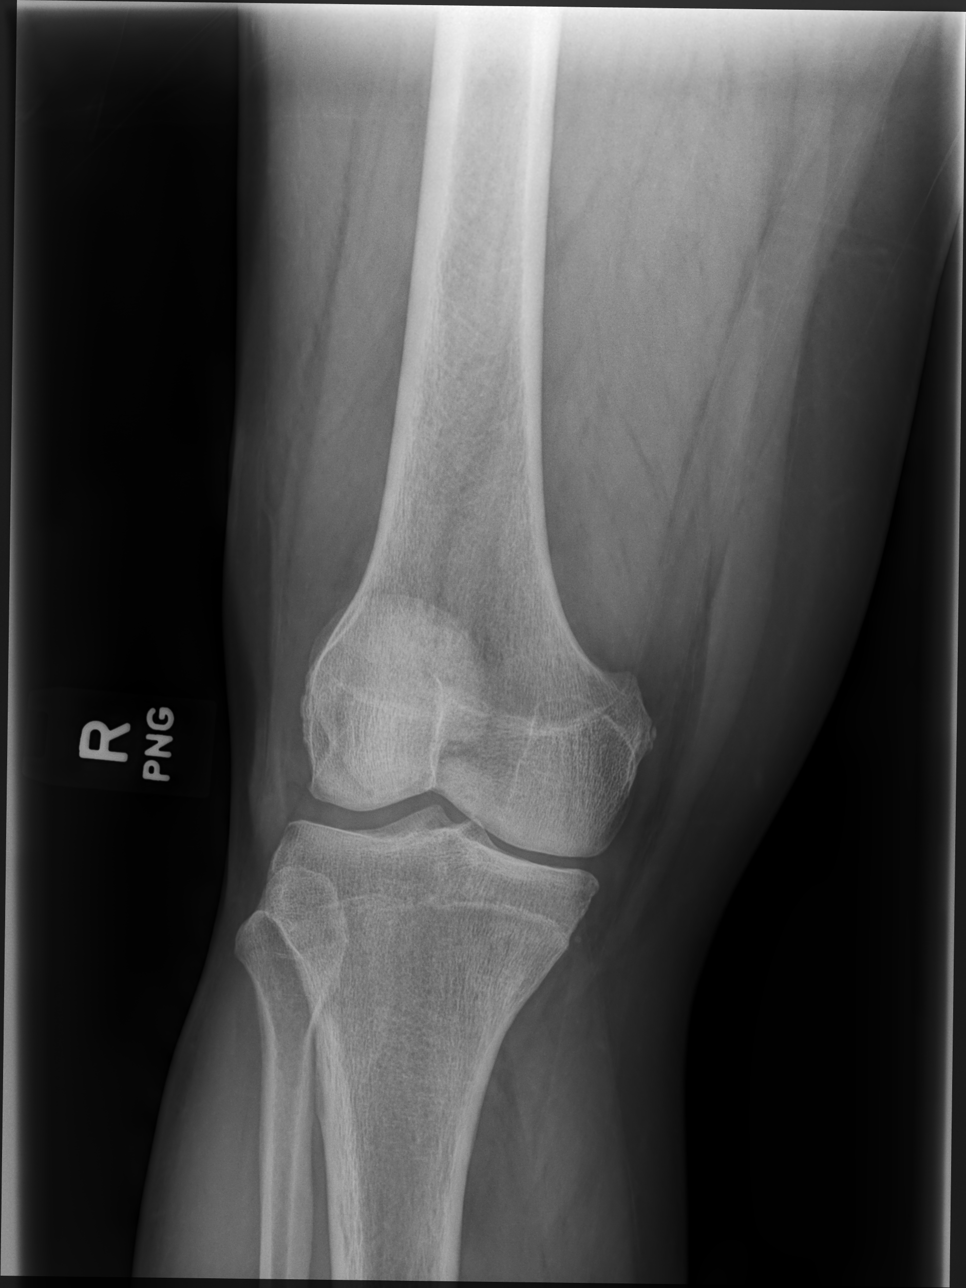

[knee mlo (2 of 2)]
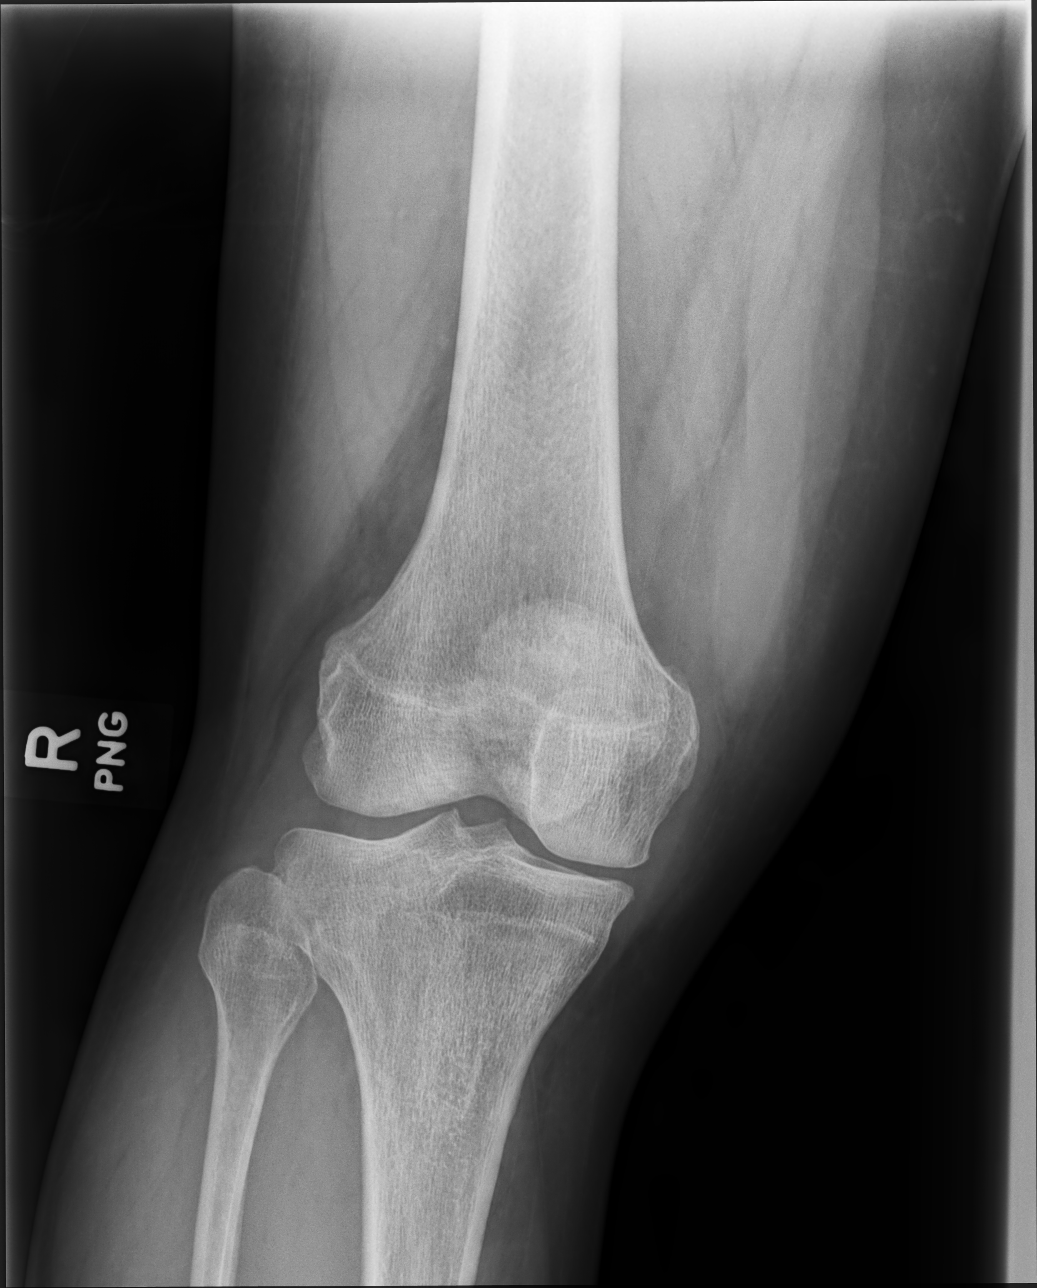

[4 of 4 positions shown; findings below may reference images not displayed]

FINDINGS: There is no acute displaced fracture or dislocation. There is a
large suprapatellar joint effusion without definite evidence for
lipohemarthrosis. The joint spaces are relatively well preserved
with mild joint space narrowing involving the medial and
patellofemoral compartments.
IMPRESSION: 1. No acute displaced fracture or dislocation.
2. Large suprapatellar joint effusion.

## 2021-05-26 ENCOUNTER — Other Ambulatory Visit: Payer: Self-pay | Admitting: Family Medicine

## 2021-05-26 NOTE — Telephone Encounter (Signed)
Rx refill request approved per Dr. Corey's orders. 

## 2021-05-28 ENCOUNTER — Other Ambulatory Visit: Payer: Self-pay | Admitting: Family Medicine

## 2021-06-06 NOTE — Progress Notes (Signed)
° °  I, Wendy Poet, LAT, ATC, am serving as scribe for Dr. Lynne Leader.  Luis Knapp is a 71 y.o. male who presents to [place] at [facility] today for f/u of L great toe pain due to gout.  He was last seen by Dr. Georgina Snell on 12/03/20 and was feeling well but was advised to return for f/u in 6 months.  He takes Allopurinol and has colchicine for gout flares.  Today, pt reports that his L great toe is feeling well.  He states that he is still taking the allopurinol and colchicine.  Diagnostic testing:  L great toe XR and Uric acid labs - 11/03/20  Pertinent review of systems: No fever or chills  Relevant historical information: HTN, HLD, Gout, Obesity   Exam:  BP 132/78 (BP Location: Right Arm, Patient Position: Sitting, Cuff Size: Normal)    Pulse 78    Ht 5\' 6"  (1.676 m)    Wt 237 lb 3.2 oz (107.6 kg)    SpO2 95%    BMI 38.29 kg/m  General: Well Developed, well nourished, and in no acute distress.   MSK: Normal gait    Lab and Radiology Results   Chemistry      Component Value Date/Time   NA 137 09/07/2020 0855   K 4.1 09/07/2020 0855   CL 97 09/07/2020 0855   CO2 33 (H) 09/07/2020 0855   BUN 25 (H) 09/07/2020 0855   CREATININE 1.08 09/07/2020 0855      Component Value Date/Time   CALCIUM 9.8 09/07/2020 0855   ALKPHOS 78 09/07/2020 0855   AST 12 09/07/2020 0855   ALT 14 09/07/2020 0855   BILITOT 0.5 09/07/2020 0855     Lab Results  Component Value Date   LABURIC 6.3 11/03/2020        Assessment and Plan: 71 y.o. male with Gout. Well controlled.  Check Uric acid and metabolic panel.  OK to stop colchicine and continue allopurinol. Colchicine should be used for just as needed for flairs.   We are getting labs today and he is fasting. I will also check his CBC and Lipid panel as these are labs that his PCP has been checking yearly and are due about now. It makes sense to check his lipid panel. Additionally last year he had an elevated  eosinophil count. Will follow up CBCw/diff.   Recheck yearly.     PDMP not reviewed this encounter. Orders Placed This Encounter  Procedures   Uric acid    Standing Status:   Future    Standing Expiration Date:   07/08/2021   CBC with Differential/Platelet    Standing Status:   Future    Standing Expiration Date:   12/05/2021   Comprehensive metabolic panel    Standing Status:   Future    Standing Expiration Date:   06/07/2022   Lipid Profile    Standing Status:   Future    Standing Expiration Date:   06/07/2022   No orders of the defined types were placed in this encounter.    Discussed warning signs or symptoms. Please see discharge instructions. Patient expresses understanding.   The above documentation has been reviewed and is accurate and complete Lynne Leader, M.D.

## 2021-06-07 ENCOUNTER — Ambulatory Visit (INDEPENDENT_AMBULATORY_CARE_PROVIDER_SITE_OTHER): Payer: Medicare Other | Admitting: Family Medicine

## 2021-06-07 ENCOUNTER — Other Ambulatory Visit: Payer: Self-pay

## 2021-06-07 ENCOUNTER — Encounter: Payer: Self-pay | Admitting: Family Medicine

## 2021-06-07 VITALS — BP 132/78 | HR 78 | Ht 66.0 in | Wt 237.2 lb

## 2021-06-07 DIAGNOSIS — M1A0720 Idiopathic chronic gout, left ankle and foot, without tophus (tophi): Secondary | ICD-10-CM | POA: Diagnosis not present

## 2021-06-07 DIAGNOSIS — R898 Other abnormal findings in specimens from other organs, systems and tissues: Secondary | ICD-10-CM | POA: Diagnosis not present

## 2021-06-07 DIAGNOSIS — M79675 Pain in left toe(s): Secondary | ICD-10-CM | POA: Diagnosis not present

## 2021-06-07 DIAGNOSIS — E782 Mixed hyperlipidemia: Secondary | ICD-10-CM

## 2021-06-07 DIAGNOSIS — I10 Essential (primary) hypertension: Secondary | ICD-10-CM | POA: Diagnosis not present

## 2021-06-07 DIAGNOSIS — M1A9XX0 Chronic gout, unspecified, without tophus (tophi): Secondary | ICD-10-CM | POA: Insufficient documentation

## 2021-06-07 LAB — COMPREHENSIVE METABOLIC PANEL
ALT: 18 U/L (ref 0–53)
AST: 15 U/L (ref 0–37)
Albumin: 4.3 g/dL (ref 3.5–5.2)
Alkaline Phosphatase: 61 U/L (ref 39–117)
BUN: 24 mg/dL — ABNORMAL HIGH (ref 6–23)
CO2: 31 mEq/L (ref 19–32)
Calcium: 9.5 mg/dL (ref 8.4–10.5)
Chloride: 99 mEq/L (ref 96–112)
Creatinine, Ser: 1.15 mg/dL (ref 0.40–1.50)
GFR: 64.21 mL/min (ref >60.00)
Glucose, Bld: 101 mg/dL — ABNORMAL HIGH (ref 70–99)
Potassium: 4 mEq/L (ref 3.5–5.1)
Sodium: 137 mEq/L (ref 135–145)
Total Bilirubin: 0.4 mg/dL (ref 0.2–1.2)
Total Protein: 7 g/dL (ref 6.0–8.3)

## 2021-06-07 LAB — CBC WITH DIFFERENTIAL/PLATELET
Basophils Absolute: 0.1 10*3/uL (ref 0.0–0.1)
Basophils Relative: 1 % (ref 0.0–3.0)
Eosinophils Absolute: 0.7 10*3/uL (ref 0.0–0.7)
Eosinophils Relative: 12 % — ABNORMAL HIGH (ref 0.0–5.0)
HCT: 43.8 % (ref 39.0–52.0)
Hemoglobin: 15 g/dL (ref 13.0–17.0)
Lymphocytes Relative: 16.7 % (ref 12.0–46.0)
Lymphs Abs: 1 10*3/uL (ref 0.7–4.0)
MCHC: 34.3 g/dL (ref 30.0–36.0)
MCV: 87.1 fl (ref 78.0–100.0)
Monocytes Absolute: 0.6 10*3/uL (ref 0.1–1.0)
Monocytes Relative: 10.6 % (ref 3.0–12.0)
Neutro Abs: 3.5 10*3/uL (ref 1.4–7.7)
Neutrophils Relative %: 59.7 % (ref 43.0–77.0)
Platelets: 246 10*3/uL (ref 150.0–400.0)
RBC: 5.02 Mil/uL (ref 4.22–5.81)
RDW: 13.4 % (ref 11.5–15.5)
WBC: 5.9 10*3/uL (ref 4.0–10.5)

## 2021-06-07 LAB — URIC ACID: Uric Acid, Serum: 7.5 mg/dL (ref 4.0–7.8)

## 2021-06-07 LAB — LIPID PANEL
Cholesterol: 146 mg/dL (ref 0–200)
HDL: 49.9 mg/dL (ref >39.00)
LDL Cholesterol: 75 mg/dL (ref 0–99)
NonHDL: 95.64
Total CHOL/HDL Ratio: 3
Triglycerides: 101 mg/dL (ref 0.0–149.0)
VLDL: 20.2 mg/dL (ref 0.0–40.0)

## 2021-06-07 NOTE — Patient Instructions (Signed)
Good to see you today.  Glad you're doing well.  Please continue taking the allopurinol.  You will take this medication indefinitely.  However, you may stop taking the colchicine.  This medication will turn into more of interim medication that you will take if/when you have a gout flare.  Please get labs today before you leave.  Follow-up as needed.

## 2021-06-08 MED ORDER — ALLOPURINOL 100 MG PO TABS: 200.0000 mg | ORAL_TABLET | Freq: Every day | ORAL | 1 refills | Status: DC

## 2021-06-08 NOTE — Addendum Note (Signed)
Addended by: Gregor Hams on: 06/08/2021 07:37 AM   Modules accepted: Orders

## 2021-06-08 NOTE — Progress Notes (Signed)
Uric acid level is higher than I would like at 7.2.  Increase allopurinol to 200 mg daily.  That is 2 pills.  New prescription sent to the [facility].  Kidney labs look good. I will send the other labs to your primary care provider but your cholesterol level went up a little more than I suspect she is going to want.

## 2021-06-14 DIAGNOSIS — R9721 Rising PSA following treatment for malignant neoplasm of prostate: Secondary | ICD-10-CM | POA: Diagnosis not present

## 2021-06-21 DIAGNOSIS — R9721 Rising PSA following treatment for malignant neoplasm of prostate: Secondary | ICD-10-CM | POA: Diagnosis not present

## 2021-09-05 ENCOUNTER — Ambulatory Visit (INDEPENDENT_AMBULATORY_CARE_PROVIDER_SITE_OTHER): Payer: Medicare Other

## 2021-09-05 VITALS — Ht 66.0 in | Wt 235.0 lb

## 2021-09-05 DIAGNOSIS — Z0000 Encounter for general adult medical examination without abnormal findings: Secondary | ICD-10-CM | POA: Diagnosis not present

## 2021-09-05 NOTE — Patient Instructions (Signed)
Luis Knapp ?Thank you for taking time to complete your Medicare Wellness Visit. I appreciate your ongoing commitment to your health goals. Please review the following plan we discussed and let me know if I can assist you in the future.  ? ?Screening recommendations/referrals: ?Colonoscopy: Completed 09/09/2020-Due 09/09/2025 ?Recommended yearly ophthalmology/optometry visit for glaucoma screening and checkup ?Recommended yearly dental visit for hygiene and checkup ? ?Vaccinations: ?Influenza vaccine: Up to date ?Pneumococcal vaccine: Up to date ?Tdap vaccine: Up to date ?Shingles vaccine: Due-May obtain vaccine at your local pharmacy. ?Covid-19: Up to date ? ?Advanced directives: May pick up forms at our office ? ?Conditions/risks identified: See problem list ? ?Next appointment: Follow up in one year for your annual wellness visit.  ? ?Preventive Care 72 Years and Older, Male ?Preventive care refers to lifestyle choices and visits with your health care provider that can promote health and wellness. ?What does preventive care include? ?A yearly physical exam. This is also called an annual well check. ?Dental exams once or twice a year. ?Routine eye exams. Ask your health care provider how often you should have your eyes checked. ?Personal lifestyle choices, including: ?Daily care of your teeth and gums. ?Regular physical activity. ?Eating a healthy diet. ?Avoiding tobacco and drug use. ?Limiting alcohol use. ?Practicing safe sex. ?Taking low doses of aspirin every day. ?Taking vitamin and mineral supplements as recommended by your health care provider. ?What happens during an annual well check? ?The services and screenings done by your health care provider during your annual well check will depend on your age, overall health, lifestyle risk factors, and family history of disease. ?Counseling  ?Your health care provider may ask you questions about your: ?Alcohol use. ?Tobacco use. ?Drug use. ?Emotional  well-being. ?Home and relationship well-being. ?Sexual activity. ?Eating habits. ?History of falls. ?Memory and ability to understand (cognition). ?Work and work Statistician. ?Screening  ?You may have the following tests or measurements: ?Height, weight, and BMI. ?Blood pressure. ?Lipid and cholesterol levels. These may be checked every 5 years, or more frequently if you are over 39 years old. ?Skin check. ?Lung cancer screening. You may have this screening every year starting at age 23 if you have a 30-pack-year history of smoking and currently smoke or have quit within the past 15 years. ?Fecal occult blood test (FOBT) of the stool. You may have this test every year starting at age 53. ?Flexible sigmoidoscopy or colonoscopy. You may have a sigmoidoscopy every 5 years or a colonoscopy every 10 years starting at age 71. ?Prostate cancer screening. Recommendations will vary depending on your family history and other risks. ?Hepatitis C blood test. ?Hepatitis B blood test. ?Sexually transmitted disease (STD) testing. ?Diabetes screening. This is done by checking your blood sugar (glucose) after you have not eaten for a while (fasting). You may have this done every 1-3 years. ?Abdominal aortic aneurysm (AAA) screening. You may need this if you are a current or former smoker. ?Osteoporosis. You may be screened starting at age 92 if you are at high risk. ?Talk with your health care provider about your test results, treatment options, and if necessary, the need for more tests. ?Vaccines  ?Your health care provider may recommend certain vaccines, such as: ?Influenza vaccine. This is recommended every year. ?Tetanus, diphtheria, and acellular pertussis (Tdap, Td) vaccine. You may need a Td booster every 10 years. ?Zoster vaccine. You may need this after age 8. ?Pneumococcal 13-valent conjugate (PCV13) vaccine. One dose is recommended after age 42. ?Pneumococcal polysaccharide (PPSV23)  vaccine. One dose is recommended after  age 64. ?Talk to your health care provider about which screenings and vaccines you need and how often you need them. ?This information is not intended to replace advice given to you by your health care provider. Make sure you discuss any questions you have with your health care provider. ?Document Released: 05/28/2015 Document Revised: 01/19/2016 Document Reviewed: 03/02/2015 ?Elsevier Interactive Patient Education ? 2017 [place]. ? ?Fall Prevention in the Home ?Falls can cause injuries. They can happen to people of all ages. There are many things you can do to make your home safe and to help prevent falls. ?What can I do on the outside of my home? ?Regularly fix the edges of walkways and driveways and fix any cracks. ?Remove anything that might make you trip as you walk through a door, such as a raised step or threshold. ?Trim any bushes or trees on the path to your home. ?Use bright outdoor lighting. ?Clear any walking paths of anything that might make someone trip, such as rocks or tools. ?Regularly check to see if handrails are loose or broken. Make sure that both sides of any steps have handrails. ?Any raised decks and porches should have guardrails on the edges. ?Have any leaves, snow, or ice cleared regularly. ?Use sand or salt on walking paths during winter. ?Clean up any spills in your garage right away. This includes oil or grease spills. ?What can I do in the bathroom? ?Use night lights. ?Install grab bars by the toilet and in the tub and shower. Do not use towel bars as grab bars. ?Use non-skid mats or decals in the tub or shower. ?If you need to sit down in the shower, use a plastic, non-slip stool. ?Keep the floor dry. Clean up any water that spills on the floor as soon as it happens. ?Remove soap buildup in the tub or shower regularly. ?Attach bath mats securely with double-sided non-slip rug tape. ?Do not have throw rugs and other things on the floor that can make you trip. ?What can I do in the  bedroom? ?Use night lights. ?Make sure that you have a light by your bed that is easy to reach. ?Do not use any sheets or blankets that are too big for your bed. They should not hang down onto the floor. ?Have a firm chair that has side arms. You can use this for support while you get dressed. ?Do not have throw rugs and other things on the floor that can make you trip. ?What can I do in the kitchen? ?Clean up any spills right away. ?Avoid walking on wet floors. ?Keep items that you use a lot in easy-to-reach places. ?If you need to reach something above you, use a strong step stool that has a grab bar. ?Keep electrical cords out of the way. ?Do not use floor polish or wax that makes floors slippery. If you must use wax, use non-skid floor wax. ?Do not have throw rugs and other things on the floor that can make you trip. ?What can I do with my stairs? ?Do not leave any items on the stairs. ?Make sure that there are handrails on both sides of the stairs and use them. Fix handrails that are broken or loose. Make sure that handrails are as long as the stairways. ?Check any carpeting to make sure that it is firmly attached to the stairs. Fix any carpet that is loose or worn. ?Avoid having throw rugs at the top or bottom  of the stairs. If you do have throw rugs, attach them to the floor with carpet tape. ?Make sure that you have a light switch at the top of the stairs and the bottom of the stairs. If you do not have them, ask someone to add them for you. ?What else can I do to help prevent falls? ?Wear shoes that: ?Do not have high heels. ?Have rubber bottoms. ?Are comfortable and fit you well. ?Are closed at the toe. Do not wear sandals. ?If you use a stepladder: ?Make sure that it is fully opened. Do not climb a closed stepladder. ?Make sure that both sides of the stepladder are locked into place. ?Ask someone to hold it for you, if possible. ?Clearly mark and make sure that you can see: ?Any grab bars or  handrails. ?First and last steps. ?Where the edge of each step is. ?Use tools that help you move around (mobility aids) if they are needed. These include: ?Canes. ?Walkers. ?Scooters. ?Crutches. ?Turn on the lights when you g

## 2021-09-05 NOTE — Progress Notes (Signed)
? ?Subjective:  ? Luis Knapp is a 71 y.o. male who presents for Medicare Annual/Subsequent preventive examination. ? ?I connected with Maciej today by telephone and verified that I am speaking with the correct person using two identifiers. ?Location patient: home ?Location provider: work ?Persons participating in the virtual visit: patient, nurse.  ?  ?I discussed the limitations, risks, security and privacy concerns of performing an evaluation and management service by telephone and the availability of in person appointments. I also discussed with the patient that there may be a patient responsible charge related to this service. The patient expressed understanding and verbally consented to this telephonic visit.  ?  ?Interactive audio and video telecommunications were attempted between this provider and patient, however failed, due to patient having technical difficulties OR patient did not have access to video capability.  We continued and completed visit with audio only. ? ?Some vital signs may be absent or patient reported.  ? ?Time Spent with patient on telephone encounter: 20 minutes ? ? ?Review of Systems    ? ?Cardiac Risk Factors include: male gender;advanced age (>8mn, >>76women);dyslipidemia;hypertension;obesity (BMI >30kg/m2) ? ?   ?Objective:  ?  ?Today's Vitals  ? 09/05/21 1556  ?Weight: 235 lb (106.6 kg)  ?Height: '5\' 6"'$  (1.676 m)  ? ?Body mass index is 37.93 kg/m?. ? ? ?  09/05/2021  ?  3:59 PM 09/01/2020  ?  9:19 AM 09/01/2019  ?  9:08 AM 03/20/2018  ?  9:26 AM 03/13/2017  ?  8:53 AM 11/11/2016  ? 10:58 AM 11/01/2016  ?  6:47 PM  ?Advanced Directives  ?Does Patient Have a Medical Advance Directive? No Yes Yes Yes Yes Yes No  ?Type of Advance Directive  Living will;Healthcare Power of [place]Living will [place] will [place]ord[place] LakesLiving will [place] will   ?Does patient want to make changes to  medical advance directive?  No - Patient declined No - Patient declined      ?Copy of [place] Chart?  No - copy requested No - copy requested   No - copy requested   ?Would patient like information on creating a medical advance directive?       No - Patient declined  ? ? ?Current Medications (verified) ?Outpatient Encounter Medications as of 09/05/2021  ?Medication Sig  ? allopurinol (ZYLOPRIM) 100 MG tablet Take 2 tablets (200 mg total) by mouth daily.  ? Cholecalciferol (VITAMIN D-3) 1000 units CAPS Take 1 capsule by mouth daily.  ? colchicine 0.6 MG tablet TAKE ONE TABLET BY MOUTH DAILY AS NEEDED FOR GOUT OR PSEUDO-GOUT PAIN  ? losartan-hydrochlorothiazide (HYZAAR) 100-25 MG tablet Take 1 tablet by mouth daily.  ? Multiple Vitamins-Minerals (MULTIVITAMIN ADULT PO) Take 1 tablet by mouth daily.  ? vitamin B-12 (CYANOCOBALAMIN) 250 MCG tablet Take 250 mcg by mouth daily.  ? aspirin EC 81 MG tablet Take 81 mg by mouth daily. (Patient not taking: Reported on 09/09/2020)  ? ?No facility-administered encounter medications on file as of 09/05/2021.  ? ? ?Allergies (verified) ?Patient has no known allergies.  ? ?History: ?Past Medical History:  ?Diagnosis Date  ? Allergic rhinitis due to pollen   ? Allergy   ? Diverticulosis of colon (without mention of hemorrhage)   ? GERD (gastroesophageal reflux disease)   ? History of colon polyps   ? 2008--  PRE CANEROUS/   2013-- ADENOMA  ? [place]  ?  prostate cancer  ? [name]   ? Unspecified essential hypertension   ? Wears glasses   ? ?Past Surgical History:  ?Procedure Laterality Date  ? COLONOSCOPY    ? COLONOSCOPY WITH PROPOFOL  10-30-2011  &  2008  ? POLYPECTOMY  ? HYDROCELE EXCISION Right 07/18/2013  ? Procedure: RIGHT HYDROCELECTOMY ADULT;  Surgeon: Ardis Hughs, MD;  Location: [facility];  Service: Urology;  Laterality: Right;  ? LYMPH NODE DISSECTION Bilateral 11/01/2016  ? Procedure: BILATERAL  PELVIC LYMPH NODE DISSECTION;  Surgeon: Ardis Hughs, MD;  Location[facility];  Service: Urology;  Laterality: Bilateral;  ? POLYPECTOMY    ? ROBOT ASSISTED LAPAROSCOPIC RADICAL PROSTATECTOMY N/A 11/01/2016  ? Procedure: XI ROBOTIC ASSISTED LAPAROSCOPIC RADICAL PROSTATECTOMY;  Surgeon: Ardis Hughs, MD;  Location: [facility];  Service: Urology;  Laterality: N/A;  ? ?Family History  ?Problem Relation Age of Onset  ? Coronary artery disease Father   ? Heart attack Father   ?     2nd one fatal  ? Pulmonary embolism Father   ?     ?  ? Dementia Father   ? Heart disease Father   ? Colon cancer Father   ? Dementia Mother   ? Hypertension Mother   ? Macular degeneration Mother   ? Kidney disease Mother   ? Diabetes Sister   ? Non-Hodgkin's lymphoma Sister   ? Colon cancer Maternal Grandfather   ? Pancreatic cancer Sister   ? Prostate cancer Neg Hx   ? Rectal cancer Neg Hx   ? Colon polyps Neg Hx   ? Esophageal cancer Neg Hx   ? ?Social History  ? ?Socioeconomic History  ? Marital status: Married  ?  Spouse name: Not on file  ? Number of children: 2  ? Years of education: Not on file  ? Highest education level: 12th grade  ?Occupational History  ? Occupation: Retired - Dance movement psychotherapist  ?  Employer: [employer]  ?  Comment: [employer], trained as  ? 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  Comment: [employer] [place] financial  ?Tobacco Use  ? Smoking status: Former  ?  Types: Cigarettes  ?  Quit date: 10/17/1995  ?  Years since quitting: 25.9  ? Smokeless tobacco: Never  ?Vaping Use  ? Vaping Use: Never used  ?Substance and Sexual Activity  ? Alcohol use: No  ? Drug use: No  ? Sexual activity: Yes  ?  Comment: with aid of SIldenafil and VED  ?Other Topics Concern  ? Not on file  ?Social History Narrative  ? Married '77- 19 years divorced; married '02. 2 sons - '79, '80; 1 grandson. Things are ok. Work is OK - Dance movement psychotherapist.   ? Fun/Hobby: Works at [employer] on a regular basis  ? Former Smoker, quit in 1997  ? ?Social  Determinants of Health  ? ?Financial Resource Strain: Low Risk   ? Difficulty of Paying Living Expenses: Not hard at all  ?Food Insecurity: No Food Insecurity  ? Worried About Charity fundraiser in the Last Year: Never true  ? Ran Out of Food in the Last Year: Never true  ?Transportation Needs: No Transportation Needs  ? Lack of Transportation (Medical): No  ? Lack of Transportation (Non-Medical): No  ?Physical Activity: Sufficiently Active  ? Days of Exercise per Week: 7 days  ? Minutes of Exercise per Session: 30 min  ?Stress: No Stress Concern Present  ? Feeling of Stress :  Not at all  ?Social Connections: Moderately Integrated  ? Frequency of Communication with Friends and Family: Once a week  ? Frequency of Social Gatherings with Friends and Family: Three times a week  ? Attends Religious Services: More than 4 times per year  ? Active Member of Clubs or Organizations: No  ? Attends Archivist Meetings: Never  ? Marital Status: Married  ? ? ?Tobacco Counseling ?Counseling given: Not Answered ? ? ?Clinical Intake: ? ?Pre-visit preparation completed: Yes ? ?Pain : No/denies pain ? ?  ? ?Nutritional Status: BMI > 30  Obese ?Nutritional Risks: None ?Diabetes: No ? ?How often do you need to have someone help you when you read instructions, pamphlets, or other written materials from your doctor or pharmacy?: 1 - Never ? ?Diabetic?No ? ?Interpreter Needed?: No ? ?Information entered by :: Caroleen Hamman LPN ? ? ?Activities of Daily Living ? ?  09/05/2021  ?  4:03 PM  ?In your present state of health, do you have any difficulty performing the following activities:  ?Hearing? 0  ?Vision? 0  ?Difficulty concentrating or making decisions? 0  ?Walking or climbing stairs? 0  ?Dressing or bathing? 0  ?Doing errands, shopping? 0  ?Preparing Food and eating ? N  ?Using the Toilet? N  ?In the past six months, have you accidently leaked urine? N  ?Do you have problems with loss of bowel control? N  ?Managing your  Medications? N  ?Housekeeping or managing your Housekeeping? N  ? ? ?Patient Care Team: ?Marrian Salvage, FNP as PCP - General (Internal Medicine) ?Ardis Hughs, MD as Attending Physician (Urology) ?Tammi Klippel

## 2021-09-28 DIAGNOSIS — Z23 Encounter for immunization: Secondary | ICD-10-CM | POA: Diagnosis not present

## 2021-10-11 DIAGNOSIS — R9721 Rising PSA following treatment for malignant neoplasm of prostate: Secondary | ICD-10-CM | POA: Diagnosis not present

## 2021-10-17 DIAGNOSIS — R9721 Rising PSA following treatment for malignant neoplasm of prostate: Secondary | ICD-10-CM | POA: Diagnosis not present

## 2021-10-24 ENCOUNTER — Other Ambulatory Visit (HOSPITAL_COMMUNITY): Payer: Self-pay | Admitting: Urology

## 2021-10-24 DIAGNOSIS — R9721 Rising PSA following treatment for malignant neoplasm of prostate: Secondary | ICD-10-CM

## 2021-11-01 ENCOUNTER — Encounter (HOSPITAL_COMMUNITY)
Admission: RE | Admit: 2021-11-01 | Discharge: 2021-11-01 | Disposition: A | Discharge status: Home / Self Care | Payer: Medicare Other | Source: Ambulatory Visit | Attending: Urology | Admitting: Urology

## 2021-11-01 DIAGNOSIS — R9721 Rising PSA following treatment for malignant neoplasm of prostate: Secondary | ICD-10-CM | POA: Diagnosis not present

## 2021-11-01 DIAGNOSIS — R918 Other nonspecific abnormal finding of lung field: Secondary | ICD-10-CM | POA: Diagnosis not present

## 2021-11-01 MED ORDER — PIFLIFOLASTAT F 18 (PYLARIFY) INJECTION
9.0000 | Freq: Once | INTRAVENOUS | Status: AC
  Administered 2021-11-01: 9.67 via INTRAVENOUS

## 2021-11-03 ENCOUNTER — Encounter: Payer: Self-pay | Admitting: Family

## 2021-11-17 ENCOUNTER — Ambulatory Visit (INDEPENDENT_AMBULATORY_CARE_PROVIDER_SITE_OTHER): Payer: Medicare Other | Admitting: Family

## 2021-11-17 VITALS — BP 160/80 | HR 69 | Temp 98.0°F | Resp 18 | Ht 66.0 in | Wt 237.0 lb

## 2021-11-17 DIAGNOSIS — Z8546 Personal history of malignant neoplasm of prostate: Secondary | ICD-10-CM

## 2021-11-17 DIAGNOSIS — M1A0720 Idiopathic chronic gout, left ankle and foot, without tophus (tophi): Secondary | ICD-10-CM | POA: Diagnosis not present

## 2021-11-17 DIAGNOSIS — Z8249 Family history of ischemic heart disease and other diseases of the circulatory system: Secondary | ICD-10-CM

## 2021-11-17 DIAGNOSIS — R898 Other abnormal findings in specimens from other organs, systems and tissues: Secondary | ICD-10-CM

## 2021-11-17 DIAGNOSIS — I10 Essential (primary) hypertension: Secondary | ICD-10-CM

## 2021-11-17 DIAGNOSIS — E785 Hyperlipidemia, unspecified: Secondary | ICD-10-CM | POA: Diagnosis not present

## 2021-11-17 LAB — CBC WITH DIFFERENTIAL/PLATELET
Basophils Absolute: 0.1 10*3/uL (ref 0.0–0.1)
Basophils Relative: 0.9 % (ref 0.0–3.0)
Eosinophils Absolute: 0.6 10*3/uL (ref 0.0–0.7)
Eosinophils Relative: 9.3 % — ABNORMAL HIGH (ref 0.0–5.0)
HCT: 42.8 % (ref 39.0–52.0)
Hemoglobin: 14.3 g/dL (ref 13.0–17.0)
Lymphocytes Relative: 15.5 % (ref 12.0–46.0)
Lymphs Abs: 1 10*3/uL (ref 0.7–4.0)
MCHC: 33.5 g/dL (ref 30.0–36.0)
MCV: 88.5 fl (ref 78.0–100.0)
Monocytes Absolute: 0.6 10*3/uL (ref 0.1–1.0)
Monocytes Relative: 9.3 % (ref 3.0–12.0)
Neutro Abs: 4 10*3/uL (ref 1.4–7.7)
Neutrophils Relative %: 65 % (ref 43.0–77.0)
Platelets: 247 10*3/uL (ref 150.0–400.0)
RBC: 4.84 Mil/uL (ref 4.22–5.81)
RDW: 13.8 % (ref 11.5–15.5)
WBC: 6.2 10*3/uL (ref 4.0–10.5)

## 2021-11-17 LAB — COMPREHENSIVE METABOLIC PANEL
ALT: 17 U/L (ref 0–53)
AST: 15 U/L (ref 0–37)
Albumin: 4.5 g/dL (ref 3.5–5.2)
Alkaline Phosphatase: 67 U/L (ref 39–117)
BUN: 24 mg/dL — ABNORMAL HIGH (ref 6–23)
CO2: 31 mEq/L (ref 19–32)
Calcium: 9.6 mg/dL (ref 8.4–10.5)
Chloride: 100 mEq/L (ref 96–112)
Creatinine, Ser: 1.16 mg/dL (ref 0.40–1.50)
GFR: 63.35 mL/min (ref >60.00)
Glucose, Bld: 95 mg/dL (ref 70–99)
Potassium: 3.9 mEq/L (ref 3.5–5.1)
Sodium: 140 mEq/L (ref 135–145)
Total Bilirubin: 0.5 mg/dL (ref 0.2–1.2)
Total Protein: 6.9 g/dL (ref 6.0–8.3)

## 2021-11-17 LAB — URIC ACID: Uric Acid, Serum: 6.2 mg/dL (ref 4.0–7.8)

## 2021-11-17 MED ORDER — VALSARTAN-HYDROCHLOROTHIAZIDE 160-25 MG PO TABS: 1.0000 | ORAL_TABLET | Freq: Every day | ORAL | 3 refills | Status: DC

## 2021-11-17 MED ORDER — ROSUVASTATIN CALCIUM 10 MG PO TABS
10.0000 mg | ORAL_TABLET | Freq: Every day | ORAL | 1 refills | Status: DC
Start: 2021-11-17 — End: 2022-03-02

## 2021-11-17 NOTE — Progress Notes (Signed)
Luis Knapp is a 71 y.o. male with the following history as recorded in EpicCare:  Patient Active Problem List   Diagnosis Date Noted   Chronic gout 06/07/2021   Mixed hyperlipidemia 06/07/2021   Eosinophil count raised 06/07/2021   Prostate cancer ([place]) 11/01/2016   Medicare annual wellness visit, initial 10/18/2015   Hydrocele, right 06/24/2013   Routine general medical examination at a health care facility 12/04/2010   Morbid obesity ([place]) 05/18/2010   SCIATICA 05/18/2010   POLYP, COLON 08/03/2007   Essential hypertension 08/03/2007   DIVERTICULOSIS, COLON 08/03/2007    Current Outpatient Medications  Medication Sig Dispense Refill   allopurinol (ZYLOPRIM) 100 MG tablet Take 2 tablets (200 mg total) by mouth daily. 180 tablet 1   Cholecalciferol (VITAMIN D-3) 1000 units CAPS Take 1 capsule by mouth daily.     Coenzyme Q10 (COQ-10) 100 MG CAPS Take by mouth.     colchicine 0.6 MG tablet TAKE ONE TABLET BY MOUTH DAILY AS NEEDED FOR GOUT OR PSEUDO-GOUT PAIN 30 tablet 2   Multiple Vitamins-Minerals (MULTIVITAMIN ADULT PO) Take 1 tablet by mouth daily.     valsartan-hydrochlorothiazide (DIOVAN-HCT) 160-25 MG tablet Take 1 tablet by mouth daily. 90 tablet 3   rosuvastatin (CRESTOR) 10 MG tablet Take 1 tablet (10 mg total) by mouth daily. 90 tablet 1   No current facility-administered medications for this visit.    Allergies: Patient has no known allergies.  Past Medical History:  Diagnosis Date   Allergic rhinitis due to pollen    Allergy    Diverticulosis of colon (without mention of hemorrhage)    GERD (gastroesophageal reflux disease)    History of colon polyps    2008--  PRE CANEROUS/   2013-- ADENOMA   Prostate cancer ([place]) 11/01/2016   prostate cancer   [name]    Unspecified essential hypertension    Wears glasses     Past Surgical History:  Procedure Laterality Date   COLONOSCOPY     COLONOSCOPY WITH PROPOFOL  10-30-2011  &  2008   POLYPECTOMY    HYDROCELE EXCISION Right 07/18/2013   Procedure: RIGHT HYDROCELECTOMY ADULT;  Surgeon: Ardis Hughs, MD;  Location: [facility];  Service: Urology;  Laterality: Right;   LYMPH NODE DISSECTION Bilateral 11/01/2016   Procedure: BILATERAL PELVIC LYMPH NODE DISSECTION;  Surgeon: Ardis Hughs, MD;  [facility];  Service: Urology;  Laterality: Bilateral;   POLYPECTOMY     ROBOT ASSISTED LAPAROSCOPIC RADICAL PROSTATECTOMY N/A 11/01/2016   Procedure: XI ROBOTIC ASSISTED LAPAROSCOPIC RADICAL PROSTATECTOMY;  Surgeon: Ardis Hughs, MD;  [facility];  Service: Urology;  Laterality: N/A;    Family History  Problem Relation Age of Onset   Coronary artery disease Father    Heart attack Father        2nd one fatal   Pulmonary embolism Father        ?   Dementia Father    Heart disease Father    Colon cancer Father    Dementia Mother    Hypertension Mother    Macular degeneration Mother    Kidney disease Mother    Diabetes Sister    Non-Hodgkin's lymphoma Sister    Colon cancer Maternal Grandfather    Pancreatic cancer Sister    Prostate cancer Neg Hx    Rectal cancer Neg Hx    Colon polyps Neg Hx    Esophageal cancer Neg Hx     Social History   Tobacco  Use   Smoking status: Former    Types: Cigarettes    Quit date: 10/17/1995    Years since quitting: 26.1   Smokeless tobacco: Never  Substance Use Topics   Alcohol use: No    Subjective:   Accompanied by wife; yearly follow up; would like to discuss recent PET scan results/ concerned about FH of CAD in father- ? Need to re-start statin;  Mentions that PSA continues to rise- s/p prostatectomy; PET scan showed no abnormalities but concerned that cells were released into blood stream when prostate was removed; sister recently passed from pancreatic cancer; wonders if other options to evaluate;      Objective:  Vitals:   11/17/21 0942  BP: (!) 160/80  Pulse: 69  Resp: 18  Temp: 98 F (36.7 C)   TempSrc: Oral  SpO2: 95%  Weight: 237 lb (107.5 kg)  Height: _0  (1.676 m)    General: Well developed, well nourished, in no acute distress  Skin : Warm and dry.  Head: Normocephalic and atraumatic  Lungs: Respirations unlabored; clear to auscultation bilaterally without wheeze, rales, rhonchi  CVS exam: normal rate and regular rhythm.  Neurologic: Alert and oriented; speech intact; face symmetrical; moves all extremities well; CNII-XII intact without focal deficit   Assessment:  1. Eosinophil count raised   2. History of prostate cancer   3. FH: CAD (coronary artery disease)   4. Hyperlipidemia, unspecified hyperlipidemia type   5. Primary hypertension   6. Chronic idiopathic gout involving toe of left foot without tophus     Plan:  & 2. Recent PET scan was reassuring but agree that concerning that PSA continuing to rise with no explanation; will have him meet with oncology for further discussion- hopeful that Dr. Marin Olp may be able to provide further insight; 3. & 4. Re-start Crestor 10 mg; refer to cardiology for further evaluation;  5.  D/C Losartan HCT- change to Valsartan HCT; follow up in 3 months;   Return in about 3 months (around 02/17/2022).  Orders Placed This Encounter  Procedures   CBC with Differential/Platelet   Comp Met (CMET)   Uric acid   Ambulatory referral to Hematology / Oncology    Referral Priority:   Routine    Referral Type:   Consultation    Referral Reason:   Specialty Services Required    Requested Specialty:   Oncology    Number of Visits Requested:   1   Ambulatory referral to Cardiology    Referral Priority:   Routine    Referral Type:   Consultation    Referral Reason:   Specialty Services Required    Requested Specialty:   Cardiology    Number of Visits Requested:   1    Requested Prescriptions   Signed Prescriptions Disp Refills   valsartan-hydrochlorothiazide (DIOVAN-HCT) 160-25 MG tablet 90 tablet 3    Sig: Take 1 tablet by  mouth daily.   rosuvastatin (CRESTOR) 10 MG tablet 90 tablet 1    Sig: Take 1 tablet (10 mg total) by mouth daily.

## 2021-11-22 ENCOUNTER — Inpatient Hospital Stay: Payer: Medicare Other | Attending: Hematology & Oncology

## 2021-11-22 ENCOUNTER — Inpatient Hospital Stay (HOSPITAL_BASED_OUTPATIENT_CLINIC_OR_DEPARTMENT_OTHER): Payer: Medicare Other | Admitting: Hematology & Oncology

## 2021-11-22 ENCOUNTER — Encounter: Payer: Self-pay | Admitting: Hematology & Oncology

## 2021-11-22 ENCOUNTER — Encounter: Payer: Self-pay | Admitting: Family

## 2021-11-22 VITALS — BP 141/76 | HR 74 | Temp 98.4°F | Resp 18 | Ht 67.0 in | Wt 234.0 lb

## 2021-11-22 DIAGNOSIS — Z79899 Other long term (current) drug therapy: Secondary | ICD-10-CM | POA: Diagnosis not present

## 2021-11-22 DIAGNOSIS — R898 Other abnormal findings in specimens from other organs, systems and tissues: Secondary | ICD-10-CM

## 2021-11-22 DIAGNOSIS — Z923 Personal history of irradiation: Secondary | ICD-10-CM | POA: Diagnosis not present

## 2021-11-22 DIAGNOSIS — C61 Malignant neoplasm of prostate: Secondary | ICD-10-CM | POA: Diagnosis not present

## 2021-11-22 LAB — CMP (CANCER CENTER ONLY)
ALT: 17 U/L (ref 0–44)
AST: 20 U/L (ref 15–41)
Albumin: 4.7 g/dL (ref 3.5–5.0)
Alkaline Phosphatase: 69 U/L (ref 38–126)
Anion gap: 10 (ref 5–15)
BUN: 26 mg/dL — ABNORMAL HIGH (ref 8–23)
CO2: 29 mmol/L (ref 22–32)
Calcium: 9.6 mg/dL (ref 8.9–10.3)
Chloride: 99 mmol/L (ref 98–111)
Creatinine: 1.21 mg/dL (ref 0.61–1.24)
GFR, Estimated: >60 mL/min (ref >60)
Glucose, Bld: 116 mg/dL — ABNORMAL HIGH (ref 70–99)
Potassium: 3.3 mmol/L — ABNORMAL LOW (ref 3.5–5.1)
Sodium: 138 mmol/L (ref 135–145)
Total Bilirubin: 0.5 mg/dL (ref 0.3–1.2)
Total Protein: 6.8 g/dL (ref 6.5–8.1)

## 2021-11-22 LAB — CBC WITH DIFFERENTIAL (CANCER CENTER ONLY)
Abs Immature Granulocytes: 0.01 10*3/uL (ref 0.00–0.07)
Basophils Absolute: 0.1 10*3/uL (ref 0.0–0.1)
Basophils Relative: 1 %
Eosinophils Absolute: 0.4 10*3/uL (ref 0.0–0.5)
Eosinophils Relative: 6 %
HCT: 41.9 % (ref 39.0–52.0)
Hemoglobin: 14.5 g/dL (ref 13.0–17.0)
Immature Granulocytes: 0 %
Lymphocytes Relative: 13 %
Lymphs Abs: 0.9 10*3/uL (ref 0.7–4.0)
MCH: 29.8 pg (ref 26.0–34.0)
MCHC: 34.6 g/dL (ref 30.0–36.0)
MCV: 86.2 fL (ref 80.0–100.0)
Monocytes Absolute: 0.7 10*3/uL (ref 0.1–1.0)
Monocytes Relative: 10 %
Neutro Abs: 4.8 10*3/uL (ref 1.7–7.7)
Neutrophils Relative %: 70 %
Platelet Count: 259 10*3/uL (ref 150–400)
RBC: 4.86 MIL/uL (ref 4.22–5.81)
RDW: 12.9 % (ref 11.5–15.5)
WBC Count: 6.9 10*3/uL (ref 4.0–10.5)
nRBC: 0 % (ref 0.0–0.2)

## 2021-11-22 LAB — LACTATE DEHYDROGENASE: LDH: 170 U/L (ref 98–192)

## 2021-11-22 LAB — SAVE SMEAR(SSMR), FOR PROVIDER SLIDE REVIEW

## 2021-11-22 NOTE — Progress Notes (Signed)
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  HPI: [name] is a really nice 71 year old white male.  He is incredibly interesting.  He is also an Investment banker, corporate.  His father was in the [employer].  He did move all over the [place].  Has a interesting job.  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  He was found to have prostate cancer back in 2018.  He underwent a robotic prostatectomy.  This was done on 11/01/2016.  Apparently, during the procedure the prostate was not nicked.  His clinical stage was pT2.  The Gleason score was 7.  He says that when he had his surgery, his PSA was I think between 4 and 5.  It subsequently went down to nondetectable.  However, in 2019, the PSA was 0.47.  Because there was a positive margin, it was felt that he needed to have radiation therapy.  He subsequently underwent radiation therapy.  I think this was started in August 2019.  He received 30 fractions and total of 68.4 Gy.  However, recently, the PSA has been going up again.   He is followed by Dr. [name] at [facility].  A nuclear medicine prostate scan using PSMA was done.  This was done on 11/01/2021.  This did not show any obvious prostate cancer.  He is felt that he needed to be seen by medical oncology.  As such, he is currently referred to the [facility] for an evaluation.  He looks great.  He feels good.  He has no pain.  There is no problems with bowels or bladder.  He has had no cough or shortness of breath.  He has had no nausea or vomiting.  He has had no rashes.  There is been no leg swelling.  He used to smoke.  He stopped this many years ago.  He does not drink.  There is no history of prostate cancer in his family.  I would have to say that his performance status is probably ECOG 0.    Past Medical History:   Diagnosis Date   Allergic rhinitis due to pollen    Allergy    Diverticulosis of colon (without mention of hemorrhage)    GERD (gastroesophageal reflux disease)    History of colon polyps    2008--  PRE CANEROUS/   2013-- ADENOMA   Prostate cancer ([place]) 11/01/2016   prostate cancer   [name]    Unspecified essential hypertension    Wears glasses   :   Past Surgical History:  Procedure Laterality Date   COLONOSCOPY     COLONOSCOPY WITH PROPOFOL  10-30-2011  &  2008   POLYPECTOMY   HYDROCELE EXCISION Right 07/18/2013   Procedure: RIGHT HYDROCELECTOMY ADULT;  Surgeon: Ardis Hughs, MD;  [facility];  Service: Urology;  Laterality: Right;   LYMPH NODE DISSECTION Bilateral 11/01/2016   Procedure: BILATERAL PELVIC LYMPH NODE DISSECTION;  Surgeon: Ardis Hughs, MD;  [facility];  Service: Urology;  Laterality: Bilateral;   POLYPECTOMY     ROBOT ASSISTED LAPAROSCOPIC RADICAL PROSTATECTOMY N/A 11/01/2016   Procedure: XI ROBOTIC ASSISTED LAPAROSCOPIC RADICAL PROSTATECTOMY;  Surgeon: Ardis Hughs, MD;  [facility];  Service: Urology;  Laterality: N/A;  :   Current Outpatient Medications:    allopurinol (ZYLOPRIM)  100 MG tablet, Take 2 tablets (200 mg total) by mouth daily., Disp: 180 tablet, Rfl: 1   Cholecalciferol (VITAMIN D-3) 1000 units CAPS, Take 1 capsule by mouth daily., Disp: , Rfl:    Coenzyme Q10 (COQ-10) 100 MG CAPS, Take by mouth., Disp: , Rfl:    Multiple Vitamins-Minerals (MULTIVITAMIN ADULT PO), Take 1 tablet by mouth daily., Disp: , Rfl:    rosuvastatin (CRESTOR) 10 MG tablet, Take 1 tablet (10 mg total) by mouth daily., Disp: 90 tablet, Rfl: 1   valsartan-hydrochlorothiazide (DIOVAN-HCT) 160-25 MG tablet, Take 1 tablet by mouth daily., Disp: 90 tablet, Rfl: 3   colchicine 0.6 MG tablet, TAKE ONE TABLET BY MOUTH DAILY AS NEEDED FOR GOUT OR PSEUDO-GOUT PAIN (Patient not taking: Reported on 11/22/2021), Disp: 30  tablet, Rfl: 2:  :  No Known Allergies:   Family History  Problem Relation Age of Onset   Coronary artery disease Father    Heart attack Father        2nd one fatal   Pulmonary embolism Father        ?   Dementia Father    Heart disease Father    Colon cancer Father    Dementia Mother    Hypertension Mother    Macular degeneration Mother    Kidney disease Mother    Diabetes Sister    Non-Hodgkin's lymphoma Sister    Colon cancer Maternal Grandfather    Pancreatic cancer Sister    Prostate cancer Neg Hx    Rectal cancer Neg Hx    Colon polyps Neg Hx    Esophageal cancer Neg Hx   :   Social History   Socioeconomic History   Marital status: Married    Spouse name: Not on file   Number of children: 2   Years of education: Not on file   Highest education level: 12th grade  Occupational History   Occupation: Retired - Chemical engineer: [employer]    Comment: [employer], trained as   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 2 sons - '79, '80; 1 grandson. Things are ok. Work is OK - Dance movement psychotherapist.    Fun/Hobby: Works at [employer] on a regular basis   Former Smoker, quit in [place] Strain: [place]  (09/05/2021)   Overall Financial Resource Strain (CARDIA)    Difficulty of Paying Living Expenses: Not hard at all  Food Insecurity: No Food Insecurity (09/05/2021)   Hunger Vital Sign    Worried About Running Out of Food in the Last Year: Never true    [place] in the Last Year: Never true  Transportation Needs: No Transportation Needs (09/05/2021)   PRAPARE - Hydrologist (Medical): No    Lack of Transportation (Non-Medical): No  Physical Activity: Sufficiently Active (09/05/2021)   Exercise Vital Sign  Days of Exercise per Week: 7 days    Minutes of Exercise per Session: 30 min  Stress: No Stress Concern Present (09/05/2021)   [place]    Feeling of Stress : Not at all  Social Connections: Moderately Integrated (09/05/2021)   Social Connection and Isolation Panel [NHANES]    Frequency of Communication with Friends and Family: Once a week    Frequency of Social Gatherings with Friends and Family: Three times a week    Attends Religious Services: More than 4 times per year    Active Member of Clubs or Organizations: No    Attends Archivist Meetings: Never    Marital Status: Married  Human resources officer Violence: Not At Risk (09/05/2021)   Humiliation, Afraid, Rape, and Kick questionnaire    Fear of Current or Ex-Partner: No    Emotionally Abused: No    Physically Abused: No    Sexually Abused: No  :  Review of Systems  Constitutional: Negative.   HENT: Negative.    Eyes: Negative.   Respiratory: Negative.    Cardiovascular: Negative.   Gastrointestinal: Negative.   Genitourinary: Negative.   Musculoskeletal: Negative.   Skin: Negative.   Neurological: Negative.   Endo/Heme/Allergies: Negative.   Psychiatric/Behavioral: Negative.       Exam: His vital signs are temperature 98.4.  Pulse 74.  Blood pressure 141/76.  Weight is 234 pounds. '@IPVITALS'$ @  Physical Exam Vitals reviewed.  HENT:     Head: Normocephalic and atraumatic.  Eyes:     Pupils: Pupils are equal, round, and reactive to light.  Cardiovascular:     Rate and Rhythm: Normal rate and regular rhythm.     Heart sounds: Normal heart sounds.  Pulmonary:      Effort: Pulmonary effort is normal.     Breath sounds: Normal breath sounds.  Abdominal:     General: Bowel sounds are normal.     Palpations: Abdomen is soft.  Musculoskeletal:        General: No tenderness or deformity. Normal range of motion.     Cervical back: Normal range of motion.  Lymphadenopathy:     Cervical: No cervical adenopathy.  Skin:    General: Skin is warm and dry.     Findings: No erythema or rash.  Neurological:     Mental Status: He is alert and oriented to person, place, and time.  Psychiatric:        Behavior: Behavior normal.        Thought Content: Thought content normal.        Judgment: Judgment normal.     Recent Labs    11/22/21 1042  WBC 6.9  HGB 14.5  HCT 41.9  PLT 259    Recent Labs    11/22/21 1042  NA 138  K 3.3*  CL 99  CO2 29  GLUCOSE 116*  BUN 26*  CREATININE 1.21  CALCIUM 9.6    Blood smear review: None  Pathology: None    Assessment and Plan: Mr. Pierotti is a very nice 71 year old white male.  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  We will have to see what his PSA is..  We recently had the PSMA scan.  This was negative.  Think that from the clinical trials that have been done, there is no benefit to starting systemic therapy/antiandrogen therapy when there is no measurable disease on radiographic studies.  I totally think  that Mr. Adan can be followed right now.  He is getting very close follow-up by Dr. [name].  His get his PSAs checked.  We will see what his PSA is.  I explained that treating his biochemical relapse right now would have more harm than benefit.  I think he would suffer from androgen deprivation.  I think this would adversely affect his quality of life.  I would think that it might be quite a while before he has any type of radiographic evidence of recurrence.  I had a long talk with he and his wife.  They are both very very nice.  It was very nice talking  with him.  I answered all of their questions.  I told him that I will be more than happy to see him back when and if he does have a radiographic recurrence of the prostate cancer and then we can discuss systemic therapy.

## 2021-11-23 LAB — TESTOSTERONE: Testosterone: 330 ng/dL (ref 264–916)

## 2021-11-23 LAB — PSA, TOTAL AND FREE
PSA, Free Pct: 13.1 %
PSA, Free: 0.21 ng/mL
Prostate Specific Ag, Serum: 1.6 ng/mL (ref 0.0–4.0)

## 2021-11-24 ENCOUNTER — Telehealth: Payer: Self-pay

## 2021-11-24 NOTE — Telephone Encounter (Signed)
-----   Message from Volanda Napoleon, MD sent at 11/23/2021  8:58 PM EDT ----- Call  -  the PSA IS ONLY 1.6.   this is still quite low.  No need to intervene with treatment!!  Laurey Arrow

## 2021-12-07 ENCOUNTER — Encounter: Payer: Self-pay | Admitting: Family Medicine

## 2021-12-08 DIAGNOSIS — L720 Epidermal cyst: Secondary | ICD-10-CM | POA: Diagnosis not present

## 2021-12-08 DIAGNOSIS — D0462 Carcinoma in situ of skin of left upper limb, including shoulder: Secondary | ICD-10-CM | POA: Diagnosis not present

## 2021-12-08 DIAGNOSIS — L813 Cafe au lait spots: Secondary | ICD-10-CM | POA: Diagnosis not present

## 2021-12-08 DIAGNOSIS — D2372 Other benign neoplasm of skin of left lower limb, including hip: Secondary | ICD-10-CM | POA: Diagnosis not present

## 2021-12-08 DIAGNOSIS — D1801 Hemangioma of skin and subcutaneous tissue: Secondary | ICD-10-CM | POA: Diagnosis not present

## 2021-12-08 DIAGNOSIS — L738 Other specified follicular disorders: Secondary | ICD-10-CM | POA: Diagnosis not present

## 2021-12-08 DIAGNOSIS — L821 Other seborrheic keratosis: Secondary | ICD-10-CM | POA: Diagnosis not present

## 2021-12-08 DIAGNOSIS — L918 Other hypertrophic disorders of the skin: Secondary | ICD-10-CM | POA: Diagnosis not present

## 2021-12-21 ENCOUNTER — Other Ambulatory Visit: Payer: Self-pay | Admitting: Family Medicine

## 2021-12-21 NOTE — Telephone Encounter (Signed)
Rx refill request approved per Dr. Corey's orders. 

## 2022-01-10 ENCOUNTER — Encounter (HOSPITAL_BASED_OUTPATIENT_CLINIC_OR_DEPARTMENT_OTHER): Payer: Self-pay | Admitting: Internal Medicine

## 2022-01-10 ENCOUNTER — Ambulatory Visit (INDEPENDENT_AMBULATORY_CARE_PROVIDER_SITE_OTHER): Payer: Medicare Other | Admitting: Internal Medicine

## 2022-01-10 VITALS — BP 134/78 | HR 73 | Ht 67.0 in | Wt 241.0 lb

## 2022-01-10 DIAGNOSIS — I2510 Atherosclerotic heart disease of native coronary artery without angina pectoris: Secondary | ICD-10-CM | POA: Diagnosis not present

## 2022-01-10 DIAGNOSIS — E785 Hyperlipidemia, unspecified: Secondary | ICD-10-CM

## 2022-01-10 NOTE — Progress Notes (Signed)
LIPID CLINIC CONSULT NOTE  Chief Complaint:  Evaluate cardiovascular risk  Primary Care Physician: Marrian Salvage, [place]  Primary Cardiologist:  None  HPI:  Luis Knapp is a 71 y.o. male who is being seen today for the evaluation of cardiovascular risk at the request of Valere Dross Marvis Repress,*.  This is a pleasant 71 year old male with a history of dyslipidemia, prostate cancer and aortic atherosclerosis, who is interested in further cardiovascular evaluation.  He had a recent PET scan which showed aortic atherosclerosis and had concerns as his father died in a nursing home and apparently was found to have had an acute coronary syndrome.  He has a history of fairly well-controlled lipids.  In the past he has been on 10 mg of rosuvastatin, noting that while on treatment his LDL cholesterol was 38.  More recently had come off of the medication and his total cholesterol is 146, triglycerides 101, HDL 49 and LDL 75.  Reasonable treatment target is 70 or lower for LDL.  His PET/CT was not notable for any coronary artery calcification.  I reviewed the CT images personally, and although they are not high-resolution, there is suggestion of some coronary calcification as well.  PMHx:  Past Medical History:  Diagnosis Date   Allergic rhinitis due to pollen    Allergy    Diverticulosis of colon (without mention of hemorrhage)    GERD (gastroesophageal reflux disease)    History of colon polyps    2008--  PRE CANEROUS/   2013-- ADENOMA   Prostate cancer ([place]) 11/01/2016   prostate cancer   [name]    Unspecified essential hypertension    Wears glasses     Past Surgical History:  Procedure Laterality Date   COLONOSCOPY     COLONOSCOPY WITH PROPOFOL  10-30-2011  &  2008   POLYPECTOMY   HYDROCELE EXCISION Right 07/18/2013   Procedure: RIGHT HYDROCELECTOMY ADULT;  Surgeon: Ardis Hughs, MD;  Location: [facility];  Service: Urology;  Laterality: Right;    LYMPH NODE DISSECTION Bilateral 11/01/2016   Procedure: BILATERAL PELVIC LYMPH NODE DISSECTION;  Surgeon: Ardis Hughs, MD;  Location: [facility];  Service: Urology;  Laterality: Bilateral;   POLYPECTOMY     ROBOT ASSISTED LAPAROSCOPIC RADICAL PROSTATECTOMY N/A 11/01/2016   Procedure: XI ROBOTIC ASSISTED LAPAROSCOPIC RADICAL PROSTATECTOMY;  Surgeon: Ardis Hughs, MD;  Location: [facility];  Service: Urology;  Laterality: N/A;    FAMHx:  Family History  Problem Relation Age of Onset   Coronary artery disease Father    Heart attack Father        2nd one fatal   Pulmonary embolism Father        ?   Dementia Father    Heart disease Father    Colon cancer Father    Dementia Mother    Hypertension Mother    Macular degeneration Mother    Kidney disease Mother    Diabetes Sister    Non-Hodgkin's lymphoma Sister    Colon cancer Maternal Grandfather    Pancreatic cancer Sister    Prostate cancer Neg Hx    Rectal cancer Neg Hx    Colon polyps Neg Hx    Esophageal cancer Neg Hx     SOCHx:   reports that he quit smoking about 26 years ago. His smoking use included cigarettes. He has never used smokeless tobacco. He reports that he does not drink alcohol and does not use drugs.  ALLERGIES:  No  Known Allergies  ROS: Pertinent items noted in HPI and remainder of comprehensive ROS otherwise negative.  HOME MEDS: Current Outpatient Medications on File Prior to Visit  Medication Sig Dispense Refill   allopurinol (ZYLOPRIM) 100 MG tablet TAKE TWO TABLETS BY MOUTH DAILY 180 tablet 1   Cholecalciferol (VITAMIN D-3) 1000 units CAPS Take 1 capsule by mouth daily.     Coenzyme Q10 (COQ-10) 100 MG CAPS Take by mouth.     colchicine 0.6 MG tablet TAKE ONE TABLET BY MOUTH DAILY AS NEEDED FOR GOUT OR PSEUDO-GOUT PAIN 30 tablet 2   Multiple Vitamins-Minerals (MULTIVITAMIN ADULT PO) Take 1 tablet by mouth daily.     rosuvastatin (CRESTOR) 10 MG tablet Take 1 tablet (10 mg total) by mouth  daily. 90 tablet 1   valsartan-hydrochlorothiazide (DIOVAN-HCT) 160-25 MG tablet Take 1 tablet by mouth daily. 90 tablet 3   No current facility-administered medications on file prior to visit.    LABS/IMAGING: No results found for this or any previous visit (from the past 48 hour(s)). No results found.  LIPID PANEL:    Component Value Date/Time   CHOL 146 06/07/2021 0818   TRIG 101.0 06/07/2021 0818   HDL 49.90 06/07/2021 0818   CHOLHDL 3 06/07/2021 0818   VLDL 20.2 06/07/2021 0818   LDLCALC 75 06/07/2021 0818   LDLDIRECT 84.0 08/19/2019 1244    WEIGHTS: Wt Readings from Last 3 Encounters:  01/10/22 241 lb (109.3 kg)  11/22/21 234 lb (106.1 kg)  11/17/21 237 lb (107.5 kg)    VITALS: BP 134/78   Pulse 73   Ht '5\' 7"'$  (1.702 m)   Wt 241 lb (109.3 kg)   BMI 37.75 kg/m   EXAM: General appearance: alert and no distress Neck: no carotid bruit, no JVD, and thyroid not enlarged, symmetric, no tenderness/mass/nodules Lungs: clear to auscultation bilaterally Heart: regular rate and rhythm, S1, S2 normal, no murmur, click, rub or gallop Abdomen: soft, non-tender; bowel sounds normal; no masses,  no organomegaly and obese Extremities: extremities normal, atraumatic, no cyanosis or edema Pulses: 2+ and symmetric Skin: Skin color, texture, turgor normal. 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   Mr. Vavra has a mixed dyslipidemia and has generally had low cholesterol.  He does have some aortic atherosclerosis and possible coronary calcification although the CT was not dedicated for that.  I think statin therapy is a good idea and he recently restarted rosuvastatin 10 mg daily.  On treatment his LDL has been as low as 35 and he may be able to  take a 5 mg dose and still have good control.  I would like to check a lipid NMR and an LP(a) to determine his risk further on therapy probably in 1 to 2 months.  Otherwise, I have no other recommendations at this time.  He is asymptomatic.  He should concentrate on more physical activity, diet and weight loss which we discussed in the office.  Follow-up with me can be as needed.  Thanks again for the kind referral.  Pixie Casino, MD, FACC, [place] Director of the [facility]  [facility] Diplomate of the American Board of Clinical Lipidology Attending Cardiologist  Direct Dial: [phone]  Fax: [phone]  Website:  [email or web address] 01/10/2022, 9:53 AM

## 2022-01-10 NOTE — Patient Instructions (Signed)
Medication Instructions:  NO CHANGES today   *If you need a refill on your cardiac medications before your next appointment, please call your pharmacy*   Lab Work: FASTING lab work to check cholesterol in about 1-2 months  If you have labs (blood work) drawn today and your tests are completely normal, you will receive your results only by: [place] (if you have MyChart) OR A paper copy in the mail If you have any lab test that is abnormal or we need to change your treatment, we will call you to review the results.   Testing/Procedures: NONE   Follow-Up: At [facility], you and your health needs are our priority.  As part of our continuing mission to provide you with exceptional heart care, we have created designated Provider Care Teams.  These Care Teams include your primary Cardiologist (physician) and Advanced Practice Providers (APPs -  Physician Assistants and Nurse Practitioners) who all work together to provide you with the care you need, when you need it.  We recommend signing up for the patient portal called "MyChart".  Sign up information is provided on this After Visit Summary.  MyChart is used to connect with patients for Virtual Visits (Telemedicine).  Patients are able to view lab/test results, encounter notes, upcoming appointments, etc.  Non-urgent messages can be sent to your provider as well.   To learn more about what you can do with MyChart, go to [email or web address].    Your next appointment:   AS NEEDED with Dr. Debara Pickett

## 2022-02-20 DIAGNOSIS — E782 Mixed hyperlipidemia: Secondary | ICD-10-CM | POA: Diagnosis not present

## 2022-02-21 LAB — LIPOPROTEIN A (LPA): Lipoprotein (a): 15.5 nmol/L (ref <75.0)

## 2022-02-21 LAB — NMR, LIPOPROFILE
Cholesterol, Total: 128 mg/dL (ref 100–199)
HDL Particle Number: 38.8 umol/L (ref >=30.5)
HDL-C: 50 mg/dL (ref >39)
LDL Particle Number: 562 nmol/L (ref <1000)
LDL Size: 19.7 nm — ABNORMAL LOW (ref >20.5)
LDL-C (NIH Calc): 49 mg/dL (ref 0–99)
LP-IR Score: 74 — ABNORMAL HIGH (ref <=45)
Small LDL Particle Number: 479 nmol/L (ref <=527)
Triglycerides: 177 mg/dL — ABNORMAL HIGH (ref 0–149)

## 2022-02-28 ENCOUNTER — Ambulatory Visit: Payer: Medicare Other | Admitting: Family

## 2022-03-02 ENCOUNTER — Encounter: Payer: Self-pay | Admitting: Family

## 2022-03-02 ENCOUNTER — Ambulatory Visit (INDEPENDENT_AMBULATORY_CARE_PROVIDER_SITE_OTHER): Payer: Medicare Other | Admitting: Family

## 2022-03-02 VITALS — BP 149/78 | HR 76 | Temp 98.0°F | Ht 66.0 in | Wt 241.0 lb

## 2022-03-02 DIAGNOSIS — E785 Hyperlipidemia, unspecified: Secondary | ICD-10-CM

## 2022-03-02 DIAGNOSIS — Z23 Encounter for immunization: Secondary | ICD-10-CM | POA: Diagnosis not present

## 2022-03-02 DIAGNOSIS — I10 Essential (primary) hypertension: Secondary | ICD-10-CM

## 2022-03-02 MED ORDER — ROSUVASTATIN CALCIUM 10 MG PO TABS
10.0000 mg | ORAL_TABLET | Freq: Every day | ORAL | 3 refills | Status: DC
Start: 2022-03-02 — End: 2023-04-03

## 2022-03-02 NOTE — Progress Notes (Signed)
Luis Knapp is a 71 y.o. male with the following history as recorded in EpicCare:  Patient Active Problem List   Diagnosis Date Noted   Chronic gout 06/07/2021   Mixed hyperlipidemia 06/07/2021   Eosinophil count raised 06/07/2021   Prostate cancer ([place]) 11/01/2016   Medicare annual wellness visit, initial 10/18/2015   Hydrocele, right 06/24/2013   Routine general medical examination at a health care facility 12/04/2010   Morbid obesity ([place]) 05/18/2010   SCIATICA 05/18/2010   POLYP, COLON 08/03/2007   Essential hypertension 08/03/2007   DIVERTICULOSIS, COLON 08/03/2007    Current Outpatient Medications  Medication Sig Dispense Refill   allopurinol (ZYLOPRIM) 100 MG tablet TAKE TWO TABLETS BY MOUTH DAILY 180 tablet 1   Cholecalciferol (VITAMIN D-3) 1000 units CAPS Take 1 capsule by mouth daily.     Coenzyme Q10 (COQ-10) 100 MG CAPS Take by mouth.     colchicine 0.6 MG tablet TAKE ONE TABLET BY MOUTH DAILY AS NEEDED FOR GOUT OR PSEUDO-GOUT PAIN 30 tablet 2   Multiple Vitamins-Minerals (MULTIVITAMIN ADULT PO) Take 1 tablet by mouth daily.     valsartan-hydrochlorothiazide (DIOVAN-HCT) 160-25 MG tablet Take 1 tablet by mouth daily. 90 tablet 3   rosuvastatin (CRESTOR) 10 MG tablet Take 1 tablet (10 mg total) by mouth daily. 90 tablet 3   No current facility-administered medications for this visit.    Allergies: Patient has no known allergies.  Past Medical History:  Diagnosis Date   Allergic rhinitis due to pollen    Allergy    Diverticulosis of colon (without mention of hemorrhage)    GERD (gastroesophageal reflux disease)    History of colon polyps    2008--  PRE CANEROUS/   2013-- ADENOMA   Prostate cancer ([place]) 11/01/2016   prostate cancer   [name]    Unspecified essential hypertension    Wears glasses     Past Surgical History:  Procedure Laterality Date   COLONOSCOPY     COLONOSCOPY WITH PROPOFOL  10-30-2011  &  2008   POLYPECTOMY   HYDROCELE EXCISION  Right 07/18/2013   Procedure: RIGHT HYDROCELECTOMY ADULT;  Surgeon: Ardis Hughs, MD;  Location: [facility];  Service: Urology;  Laterality: Right;   LYMPH NODE DISSECTION Bilateral 11/01/2016   Procedure: BILATERAL PELVIC LYMPH NODE DISSECTION;  Surgeon: Ardis Hughs, MD;  [facility];  Service: Urology;  Laterality: Bilateral;   POLYPECTOMY     ROBOT ASSISTED LAPAROSCOPIC RADICAL PROSTATECTOMY N/A 11/01/2016   Procedure: XI ROBOTIC ASSISTED LAPAROSCOPIC RADICAL PROSTATECTOMY;  Surgeon: Ardis Hughs, MD;  Location[facility];  Service: Urology;  Laterality: N/A;    Family History  Problem Relation Age of Onset   Coronary artery disease Father    Heart attack Father        2nd one fatal   Pulmonary embolism Father        ?   Dementia Father    Heart disease Father    Colon cancer Father    Dementia Mother    Hypertension Mother    Macular degeneration Mother    Kidney disease Mother    Diabetes Sister    Non-Hodgkin's lymphoma Sister    Colon cancer Maternal Grandfather    Pancreatic cancer Sister    Prostate cancer Neg Hx    Rectal cancer Neg Hx    Colon polyps Neg Hx    Esophageal cancer Neg Hx     Social History   Tobacco Use  Smoking status: Former    Types: Cigarettes    Quit date: 10/17/1995    Years since quitting: 26.3   Smokeless tobacco: Never  Substance Use Topics   Alcohol use: No    Subjective:  3 month follow up on hypertension; suspected history of white coat hypertension; has gained approximately 7 pounds since last OV; Denies any chest pain, shortness of breath, blurred vision or headache Scheduled for COVID vaccine next week;     Objective:  Vitals:   03/02/22 0818 03/02/22 0821  BP: (!) 160/70 (!) 149/78  Pulse: 77 76  Temp: 98 F (36.7 C)   TempSrc: Oral   SpO2: 96%   Weight: 241 lb (109.3 kg)   Height: '5\' 6"'$  (1.676 m)     General: Well developed, well nourished, in no acute distress  Skin : Warm and  dry.  Head: Normocephalic and atraumatic  Eyes: Sclera and conjunctiva clear; pupils round and reactive to light; extraocular movements intact  Ears: External normal; canals clear; tympanic membranes normal  Oropharynx: Pink, supple. No suspicious lesions  Neck: Supple without thyromegaly, adenopathy  Lungs: Respirations unlabored; clear to auscultation bilaterally without wheeze, rales, rhonchi  CVS exam: normal rate and regular rhythm.  Neurologic: Alert and oriented; speech intact; face symmetrical; moves all extremities well; CNII-XII intact without focal deficit   Assessment:  1. Essential hypertension   2. Hyperlipidemia, unspecified hyperlipidemia type     Plan:  ? Control vs white coat hypertension; his machine is accurate; he will send readings over the next week from home- to consider increasing to 320/25; encouraged to work on weight loss; Stable; refill updated;   Flu shot updated today;   No follow-ups on file.  No orders of the defined types were placed in this encounter.   Requested Prescriptions   Signed Prescriptions Disp Refills   rosuvastatin (CRESTOR) 10 MG tablet 90 tablet 3    Sig: Take 1 tablet (10 mg total) by mouth daily.

## 2022-03-02 NOTE — Patient Instructions (Signed)
Ganglion Cyst  A ganglion cyst is a non-cancerous, fluid-filled lump of tissue that occurs near a joint, tendon, or ligament. The cyst grows out of a joint or the lining of a tendon or ligament. Ganglion cysts most often develop in the hand or wrist, but they can also develop in the shoulder, elbow, hip, knee, ankle, or foot. Ganglion cysts are ball-shaped or egg-shaped. Their size can range from the size of a pea to larger than a grape. Increased activity may cause the cyst to get bigger because more fluid starts to build up. What are the causes? The exact cause of this condition is not known, but it may be related to: Inflammation or irritation around the joint. An injury or tear in the layers of tissue around the joint (joint capsule). Repetitive movements or overuse. History of acute or repeated injury. What increases the risk? You are more likely to develop this condition if: You are a male. You are 74-72 years old. What are the signs or symptoms? The main symptom of this condition is a lump. It most often appears on the hand or wrist. In many cases, there are no other symptoms, but a cyst can sometimes cause: Tingling. Pain or tenderness. Numbness. Weakness or loss of strength in the affected joint. Decreased range of motion in the affected area of the body. How is this diagnosed? Ganglion cysts are usually diagnosed based on a physical exam. Your health care provider will feel the lump and may shine a light next to it. If it is a ganglion cyst, the light will likely shine through it. Your health care provider may order an X-ray, ultrasound, MRI, or CT scan to rule out other conditions. How is this treated? Ganglion cysts often go away on their own without treatment. If you have pain or other symptoms, treatment may be needed. Treatment is also needed if the ganglion cyst limits your movement or if it gets infected. Treatment may include: Wearing a brace or splint on your wrist or  finger. Taking anti-inflammatory medicine. Having fluid drained from the lump with a needle (aspiration). Getting an injection of medicine into the joint to decrease inflammation. This may be corticosteroids, ethanol, or hyaluronidase. Having surgery to remove the ganglion cyst. Placing a pad in your shoe or wearing shoes that will not rub against the cyst if it is on your foot. Follow these instructions at home: Do not press on the ganglion cyst, poke it with a needle, or hit it. Take over-the-counter and prescription medicines only as told by your health care provider. If you have a brace or splint: Wear it as told by your health care provider. Remove it as told by your health care provider. Ask if you need to remove it when you take a shower or a bath. Watch your ganglion cyst for any changes. Keep all follow-up visits as told by your health care provider. This is important. Contact a health care provider if: Your ganglion cyst becomes larger or more painful. You have pus coming from the lump. You have weakness or numbness in the affected area. You have a fever or chills. Get help right away if: You have a fever and have any of these in the cyst area: Increased redness. Red streaks. Swelling. Summary A ganglion cyst is a non-cancerous, fluid-filled lump that occurs near a joint, tendon, or ligament. Ganglion cysts most often develop in the hand or wrist, but they can also develop in the shoulder, elbow, hip, knee, ankle, or  foot. Ganglion cysts often go away on their own without treatment. This information is not intended to replace advice given to you by your health care provider. Make sure you discuss any questions you have with your health care provider. Document Revised: 07/23/2019 Document Reviewed: 07/23/2019 Elsevier Patient Education  [place].

## 2022-03-23 ENCOUNTER — Encounter: Payer: Self-pay | Admitting: Family

## 2022-03-29 DIAGNOSIS — Z23 Encounter for immunization: Secondary | ICD-10-CM | POA: Diagnosis not present

## 2022-04-24 DIAGNOSIS — R9721 Rising PSA following treatment for malignant neoplasm of prostate: Secondary | ICD-10-CM | POA: Diagnosis not present

## 2022-05-01 DIAGNOSIS — R9721 Rising PSA following treatment for malignant neoplasm of prostate: Secondary | ICD-10-CM | POA: Diagnosis not present

## 2022-05-01 DIAGNOSIS — Z8546 Personal history of malignant neoplasm of prostate: Secondary | ICD-10-CM | POA: Diagnosis not present

## 2022-05-22 DIAGNOSIS — H524 Presbyopia: Secondary | ICD-10-CM | POA: Diagnosis not present

## 2022-05-22 DIAGNOSIS — H52223 Regular astigmatism, bilateral: Secondary | ICD-10-CM | POA: Diagnosis not present

## 2022-05-22 DIAGNOSIS — H5203 Hypermetropia, bilateral: Secondary | ICD-10-CM | POA: Diagnosis not present

## 2022-05-22 DIAGNOSIS — H43811 Vitreous degeneration, right eye: Secondary | ICD-10-CM | POA: Diagnosis not present

## 2022-05-22 DIAGNOSIS — H43391 Other vitreous opacities, right eye: Secondary | ICD-10-CM | POA: Diagnosis not present

## 2022-06-22 ENCOUNTER — Other Ambulatory Visit: Payer: Self-pay | Admitting: Family Medicine

## 2022-06-22 NOTE — Telephone Encounter (Signed)
Rx refill request approved per Dr. Corey's orders. 

## 2022-07-18 ENCOUNTER — Ambulatory Visit (INDEPENDENT_AMBULATORY_CARE_PROVIDER_SITE_OTHER): Payer: Medicare Other | Admitting: Family

## 2022-07-18 ENCOUNTER — Encounter: Payer: Self-pay | Admitting: Family

## 2022-07-18 VITALS — BP 138/72 | HR 76 | Temp 97.3°F | Resp 18 | Ht 66.0 in | Wt 242.4 lb

## 2022-07-18 DIAGNOSIS — I10 Essential (primary) hypertension: Secondary | ICD-10-CM

## 2022-07-18 MED ORDER — VALSARTAN-HYDROCHLOROTHIAZIDE 320-25 MG PO TABS: 1.0000 | ORAL_TABLET | Freq: Every day | ORAL | 1 refills | Status: DC

## 2022-07-18 NOTE — Patient Instructions (Signed)
Please let me hear from you in 2 weeks or so with how you are feeling on the new blood pressure medication.

## 2022-07-18 NOTE — Progress Notes (Signed)
Luis Knapp is a 72 y.o. male with the following history as recorded in EpicCare:  Patient Active Problem List   Diagnosis Date Noted   Chronic gout 06/07/2021   Mixed hyperlipidemia 06/07/2021   Eosinophil count raised 06/07/2021   Prostate cancer ([place]) 11/01/2016   Medicare annual wellness visit, initial 10/18/2015   Hydrocele, right 06/24/2013   Routine general medical examination at a health care facility 12/04/2010   Morbid obesity ([place]) 05/18/2010   SCIATICA 05/18/2010   POLYP, COLON 08/03/2007   Essential hypertension 08/03/2007   DIVERTICULOSIS, COLON 08/03/2007    Current Outpatient Medications  Medication Sig Dispense Refill   allopurinol (ZYLOPRIM) 100 MG tablet TAKE 2 TABLETS BY MOUTH DAILY 180 tablet 1   Cholecalciferol (VITAMIN D-3) 1000 units CAPS Take 1 capsule by mouth daily.     Coenzyme Q10 (COQ-10) 100 MG CAPS Take by mouth.     colchicine 0.6 MG tablet TAKE ONE TABLET BY MOUTH DAILY AS NEEDED FOR GOUT OR PSEUDO-GOUT PAIN 30 tablet 2   Multiple Vitamins-Minerals (MULTIVITAMIN ADULT PO) Take 1 tablet by mouth daily.     rosuvastatin (CRESTOR) 10 MG tablet Take 1 tablet (10 mg total) by mouth daily. 90 tablet 3   valsartan-hydrochlorothiazide (DIOVAN-HCT) 320-25 MG tablet Take 1 tablet by mouth daily. 90 tablet 1   No current facility-administered medications for this visit.    Allergies: Patient has no known allergies.  Past Medical History:  Diagnosis Date   Allergic rhinitis due to pollen    Allergy    Diverticulosis of colon (without mention of hemorrhage)    GERD (gastroesophageal reflux disease)    History of colon polyps    2008--  PRE CANEROUS/   2013-- ADENOMA   Prostate cancer ([place]) 11/01/2016   prostate cancer   [name]    Unspecified essential hypertension    Wears glasses     Past Surgical History:  Procedure Laterality Date   COLONOSCOPY     COLONOSCOPY WITH PROPOFOL  10-30-2011  &  2008   POLYPECTOMY   HYDROCELE EXCISION  Right 07/18/2013   Procedure: RIGHT HYDROCELECTOMY ADULT;  Surgeon: Ardis Hughs, MD;  Location: [facility];  Service: Urology;  Laterality: Right;   LYMPH NODE DISSECTION Bilateral 11/01/2016   Procedure: BILATERAL PELVIC LYMPH NODE DISSECTION;  Surgeon: Ardis Hughs, MD;  [facility];  Service: Urology;  Laterality: Bilateral;   POLYPECTOMY     ROBOT ASSISTED LAPAROSCOPIC RADICAL PROSTATECTOMY N/A 11/01/2016   Procedure: XI ROBOTIC ASSISTED LAPAROSCOPIC RADICAL PROSTATECTOMY;  Surgeon: Ardis Hughs, MD;  Location[facility];  Service: Urology;  Laterality: N/A;    Family History  Problem Relation Age of Onset   Coronary artery disease Father    Heart attack Father        2nd one fatal   Pulmonary embolism Father        ?   Dementia Father    Heart disease Father    Colon cancer Father    Dementia Mother    Hypertension Mother    Macular degeneration Mother    Kidney disease Mother    Diabetes Sister    Non-Hodgkin's lymphoma Sister    Colon cancer Maternal Grandfather    Pancreatic cancer Sister    Prostate cancer Neg Hx    Rectal cancer Neg Hx    Colon polyps Neg Hx    Esophageal cancer Neg Hx     Social History   Tobacco Use  Smoking status: Former    Types: Cigarettes    Quit date: 10/17/1995    Years since quitting: 26.7   Smokeless tobacco: Never  Substance Use Topics   Alcohol use: No    Subjective:   6 month follow up on hypertension; is concerned about blood pressure not being well controlled; has not lost any weight since last appointment;  Average blood pressure per patient's home reading is 144/75;  Objective:  Vitals:   07/18/22 0807  BP: 138/72  Pulse: 76  Resp: 18  Temp: (!) 97.3 F (36.3 C)  TempSrc: Temporal  SpO2: 96%  Weight: 242 lb 6.4 oz (110 kg)  Height: '5\' 6"'$  (1.676 m)    General: Well developed, well nourished, in no acute distress  Skin : Warm and dry.  Head: Normocephalic and atraumatic  Lungs:  Respirations unlabored; clear to auscultation bilaterally without wheeze, rales, rhonchi  CVS exam: normal rate and regular rhythm.  Neurologic: Alert and oriented; speech intact; face symmetrical; moves all extremities well; CNII-XII intact without focal deficit   Assessment:  1. Essential hypertension     Plan:  ? Control- increase Valsartan HCT to 320/25 1 tablet po qd; continue to monitor blood pressure and send readings back for review in 2 weeks.   No follow-ups on file.  No orders of the defined types were placed in this encounter.   Requested Prescriptions   Signed Prescriptions Disp Refills   valsartan-hydrochlorothiazide (DIOVAN-HCT) 320-25 MG tablet 90 tablet 1    Sig: Take 1 tablet by mouth daily.

## 2022-08-01 DIAGNOSIS — H2513 Age-related nuclear cataract, bilateral: Secondary | ICD-10-CM | POA: Diagnosis not present

## 2022-08-01 DIAGNOSIS — H43393 Other vitreous opacities, bilateral: Secondary | ICD-10-CM | POA: Diagnosis not present

## 2022-08-01 DIAGNOSIS — H524 Presbyopia: Secondary | ICD-10-CM | POA: Diagnosis not present

## 2022-08-01 DIAGNOSIS — H52223 Regular astigmatism, bilateral: Secondary | ICD-10-CM | POA: Diagnosis not present

## 2022-08-01 DIAGNOSIS — H5203 Hypermetropia, bilateral: Secondary | ICD-10-CM | POA: Diagnosis not present

## 2022-08-03 ENCOUNTER — Encounter: Payer: Self-pay | Admitting: Family

## 2022-08-15 ENCOUNTER — Ambulatory Visit (INDEPENDENT_AMBULATORY_CARE_PROVIDER_SITE_OTHER): Payer: Medicare Other

## 2022-08-15 ENCOUNTER — Ambulatory Visit: Payer: Self-pay

## 2022-08-15 ENCOUNTER — Ambulatory Visit (INDEPENDENT_AMBULATORY_CARE_PROVIDER_SITE_OTHER): Payer: Medicare Other | Admitting: Family Medicine

## 2022-08-15 VITALS — BP 130/74 | HR 74 | Ht 66.0 in | Wt 239.0 lb

## 2022-08-15 DIAGNOSIS — M25562 Pain in left knee: Secondary | ICD-10-CM

## 2022-08-15 DIAGNOSIS — G8929 Other chronic pain: Secondary | ICD-10-CM

## 2022-08-15 DIAGNOSIS — M1A0720 Idiopathic chronic gout, left ankle and foot, without tophus (tophi): Secondary | ICD-10-CM | POA: Diagnosis not present

## 2022-08-15 NOTE — Progress Notes (Signed)
[name], PhD, LAT, ATC acting as a scribe for [name], MD.  Luis Knapp is a 72 y.o. male who presents to [facility] at [facility] today for L knee pain. Pt was previously seen by Dr. Denyse Amass on 06/07/21 for a gout flare in his L Great toe.  Today, pt reports L knee pain ongoing for 2-3 years, worsening yesterday. Pt was taking a break from mowing his yard and then was unable to get back up. Pt locates pain to the anterior aspect of the L knee.  L Knee swelling: slight Mechanical symptoms: yes Aggravates: transitioning to standing, rotational motions Treatments tried: Voltaren gel  Dx testing: 11/17/21 labs  Pertinent review of systems: No fevers or chills  Relevant historical information: History of gout in the toe.   Exam:  BP 130/74   Pulse 74   Ht 5\' 6"  (1.676 m)   Wt 239 lb (108.4 kg)   SpO2 94%   BMI 38.58 kg/m  General: Well Developed, well nourished, and in no acute distress.   MSK: Left knee: Moderate effusion normal-appearing otherwise. Normal motion.  Pain present with flexion. Tender palpation medial joint line. Stable ligamentous exam. Mildly positive Murray's test. Intact strength.    Lab and Radiology Results  Procedure: Real-time Ultrasound Guided aspiration and injection of left knee joint superior lateral patellar space Device: Philips Affiniti 50G Images permanently stored and available for review in PACS Verbal informed consent obtained.  Discussed risks and benefits of procedure. Warned about infection, bleeding, hyperglycemia damage to structures among others. Patient expresses understanding and agreement Time-out conducted.   Noted no overlying erythema, induration, or other signs of local infection.   Skin prepped in a sterile fashion.   Local anesthesia: Topical Ethyl chloride.   With sterile technique and under real time ultrasound guidance: 2 mL of lidocaine injected into subcutaneous tissue overlying  planned aspiration tract achieving good anesthesia. Skin sterilized with isopropyl alcohol. 18-gauge needle used to access the knee joint and 30 mL of clear straw-colored fluid aspirated. Syringe was exchanged and 40 mg of Kenalog and 2 m Marcaine were injected into the knee joint. Completed without difficulty   Pain immediately resolved suggesting accurate placement of the medication.   Advised to call if fevers/chills, erythema, induration, drainage, or persistent bleeding.   Images permanently stored and available for review in the ultrasound unit.  Impression: Technically successful ultrasound guided injection.    X-ray images left knee obtained today personally and independently interpreted Minimal degenerative changes seen on x-ray.  No acute fractures. Await formal radiology review     Assessment and Plan: 72 y.o. 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  He does have a history of gout although today's pain does not seem consistent to me to be gout.  Knee aspiration was clear indicating that gout is very unlikely.  Will proceed with aspiration and steroid injection.  Will recheck his uric acid level as his last serum uric acid was 6.2 back in July 2023.  I suspect it is going to be the same or better as he is still on his medicine. Recommend Voltaren gel. Check back in 6 weeks.    PDMP not reviewed this encounter. Orders Placed This Encounter  Procedures   [place] LIMITED JOINT SPACE STRUCTURES LOW LEFT(NO LINKED CHARGES)    Order Specific Question:   Reason for Exam (SYMPTOM  OR DIAGNOSIS REQUIRED)    Answer:   left knee pain  Order Specific Question:   Preferred imaging location?    Answer:   [facility] Knee AP/LAT W/[place] Left    Standing Status:   Future    Number of Occurrences:   1    Standing Expiration Date:   08/15/2023    Order Specific Question:   Reason for Exam (SYMPTOM  OR DIAGNOSIS REQUIRED)    Answer:   eva  knee pain    Order Specific Question:   Preferred imaging location?    Answer:   [facility]   Uric acid    Standing Status:   Future    Number of Occurrences:   1    Standing Expiration Date:   08/15/2023   No orders of the defined types were placed in this encounter.    Discussed warning signs or symptoms. Please see discharge instructions. Patient expresses understanding.   The above documentation has been reviewed and is accurate and complete [name], M.D.

## 2022-08-15 NOTE — Patient Instructions (Addendum)
Thank you for coming in today.   Please get labs today before you leave   Please use Voltaren gel (Generic Diclofenac Gel) up to 4x daily for pain as needed.  This is available over-the-counter as both the name brand Voltaren gel and the generic diclofenac gel.   You received an injection today. Seek immediate medical attention if the joint becomes red, extremely painful, or is oozing fluid.     Please get an Xray today before you leave   Recheck in 6 weeks.

## 2022-08-16 LAB — URIC ACID: Uric Acid, Serum: 7 mg/dL (ref 4.0–7.8)

## 2022-08-17 MED ORDER — ALLOPURINOL 300 MG PO TABS: 300.0000 mg | ORAL_TABLET | Freq: Every day | ORAL | 1 refills | Status: DC

## 2022-08-17 NOTE — Addendum Note (Signed)
Addended by: Gregor Hams on: 08/17/2022 06:02 AM   Modules accepted: Orders

## 2022-08-17 NOTE — Progress Notes (Signed)
Left knee x-ray shows some minimal arthritis underneath the kneecap.

## 2022-08-17 NOTE — Progress Notes (Signed)
Uric acid level is higher than I would like at 7.0.  I am increasing allopurinol from 200 mg daily to 300 mg daily.  Going from two 100 mg pills to one 300 mg pill.  New prescription sent to the [facility] on [address].

## 2022-08-22 ENCOUNTER — Encounter: Payer: Self-pay | Admitting: Family

## 2022-09-07 ENCOUNTER — Ambulatory Visit (INDEPENDENT_AMBULATORY_CARE_PROVIDER_SITE_OTHER): Payer: Medicare Other | Admitting: *Deleted

## 2022-09-07 DIAGNOSIS — Z0000 Encounter for general adult medical examination without abnormal findings: Secondary | ICD-10-CM

## 2022-09-07 NOTE — Progress Notes (Signed)
Subjective:  Pt completed ADLs, Fall risk, and SDOH during e-check in on 08/31/22.  Answers verified with pt.    Luis Knapp is a 72 y.o. male who presents for Medicare Annual/Subsequent preventive examination.  I connected with  Luis Knapp on 09/07/22 by a audio enabled telemedicine application and verified that I am speaking with the correct person using two identifiers.  Patient Location: Home  Provider Location: Office/Clinic  I discussed the limitations of evaluation and management by telemedicine. The patient expressed understanding and agreed to proceed.   Review of Systems     Cardiac Risk Factors include: advanced age (>46men, >26 women);male gender;obesity (BMI >30kg/m2);dyslipidemia;hypertension     Objective:    There were no vitals filed for this visit. There is no height or weight on file to calculate BMI.     09/07/2022    8:22 AM 11/22/2021   11:00 AM 09/05/2021    3:59 PM 09/01/2020    9:19 AM 09/01/2019    9:08 AM 03/20/2018    9:26 AM 03/13/2017    8:53 AM  Advanced Directives  Does Patient Have a Medical Advance Directive? Yes Yes No Yes Yes Yes Yes  Type of Estate agent of [place];Living will Healthcare Power of [place];Living will  Living will;Healthcare Power of [employer] Power of [place];Living will Healthcare Power of [place];Living will Healthcare Power of [place];Living will  Does patient want to make changes to medical advance directive? No - Patient declined No - Patient declined  No - Patient declined No - Patient declined    Copy of Healthcare Power of Attorney in Chart? No - copy requested No - copy requested  No - copy requested No - copy requested      Current Medications (verified) Outpatient Encounter Medications as of 09/07/2022  Medication Sig   allopurinol (ZYLOPRIM) 300 MG tablet Take 1 tablet (300 mg total) by mouth daily.   Cholecalciferol (VITAMIN D-3) 1000 units CAPS Take 1 capsule by  mouth daily.   Coenzyme Q10 (COQ-10) 100 MG CAPS Take by mouth.   colchicine 0.6 MG tablet TAKE ONE TABLET BY MOUTH DAILY AS NEEDED FOR GOUT OR PSEUDO-GOUT PAIN   Multiple Vitamins-Minerals (MULTIVITAMIN ADULT PO) Take 1 tablet by mouth daily.   rosuvastatin (CRESTOR) 10 MG tablet Take 1 tablet (10 mg total) by mouth daily.   valsartan-hydrochlorothiazide (DIOVAN-HCT) 320-25 MG tablet Take 1 tablet by mouth daily.   No facility-administered encounter medications on file as of 09/07/2022.    Allergies (verified) Patient has no known allergies.   History: Past Medical History:  Diagnosis Date   Allergic rhinitis due to pollen    Allergy    Diverticulosis of colon (without mention of hemorrhage)    GERD (gastroesophageal reflux disease)    History of colon polyps    2008--  PRE CANEROUS/   2013-- ADENOMA   Prostate cancer 11/01/2016   prostate cancer   [name]    Unspecified essential hypertension    Wears glasses    Past Surgical History:  Procedure Laterality Date   COLONOSCOPY     COLONOSCOPY WITH PROPOFOL  10-30-2011  &  2008   POLYPECTOMY   HYDROCELE EXCISION Right 07/18/2013   Procedure: RIGHT HYDROCELECTOMY ADULT;  Surgeon: Crist Fat, MD;  Location: [facility];  Service: Urology;  Laterality: Right;   LYMPH NODE DISSECTION Bilateral 11/01/2016   Procedure: BILATERAL PELVIC LYMPH NODE DISSECTION;  Surgeon: Crist Fat, MD;  Location: [facility];  Service: Urology;  Laterality: Bilateral;   POLYPECTOMY     ROBOT ASSISTED LAPAROSCOPIC RADICAL PROSTATECTOMY N/A 11/01/2016   Procedure: XI ROBOTIC ASSISTED LAPAROSCOPIC RADICAL PROSTATECTOMY;  Surgeon: Crist Fat, MD;  Location: [facility];  Service: Urology;  Laterality: N/A;   Family History  Problem Relation Age of Onset   Coronary artery disease Father    Heart attack Father        2nd one fatal   Pulmonary embolism Father        ?   Dementia Father    Heart disease Father     Colon cancer Father    Dementia Mother    Hypertension Mother    Macular degeneration Mother    Kidney disease Mother    Diabetes Sister    Non-Hodgkin's lymphoma Sister    Colon cancer Maternal Grandfather    Pancreatic cancer Sister    Prostate cancer Neg Hx    Rectal cancer Neg Hx    Colon polyps Neg Hx    Esophageal cancer Neg Hx    Social History   Socioeconomic History   Marital status: Married    Spouse name: Not on file   Number of children: 2   Years of education: Not on file   Highest education level: 12th grade  Occupational History   Occupation: Retired - Probation officer: [employer]    Comment: [employer], trained as   Occupation: Catering manager firm    Comment: Printmaker  Tobacco Use   Smoking status: Former    Types: Cigarettes    Quit date: 10/17/1995    Years since quitting: 26.9   Smokeless tobacco: Never  Vaping Use   Vaping Use: Never used  Substance and Sexual Activity   Alcohol use: No   Drug use: No   Sexual activity: Yes    Comment: with aid of SIldenafil and VED  Other Topics Concern   Not on file  Social History Narrative   Married '77- 19 years divorced; married '02. 2 sons - '79, '80; 1 grandson. Things are ok. Work is OK - Quarry manager.    Fun/Hobby: Works at [employer] on a regular basis   Former Smoker, quit in 1997   Social Determinants of Health   Financial Resource Strain: Low Risk  (08/31/2022)   Overall Financial Resource Strain (CARDIA)    Difficulty of Paying Living Expenses: Not hard at all  Food Insecurity: No Food Insecurity (08/31/2022)   Hunger Vital Sign    Worried About Running Out of Food in the Last Year: Never true    Ran Out of Food in the Last Year: Never true  Transportation Needs: No Transportation Needs (08/31/2022)   PRAPARE - Administrator, Civil Service (Medical): No    Lack of Transportation (Non-Medical): No  Physical Activity: Sufficiently Active (08/31/2022)    Exercise Vital Sign    Days of Exercise per Week: 5 days    Minutes of Exercise per Session: 30 min  Stress: No Stress Concern Present (08/31/2022)   [employer] of Occupational Health - Occupational Stress Questionnaire    Feeling of Stress : Not at all  Social Connections: Unknown (08/31/2022)   Social Connection and Isolation Panel [NHANES]    Frequency of Communication with Friends and Family: Twice a week    Frequency of Social Gatherings with Friends and Family: Twice a week    Attends Religious Services: Not on file    Active  Member of Clubs or Organizations: Yes    Attends Engineer, structural: More than 4 times per year    Marital Status: Married    Tobacco Counseling Counseling given: Not Answered   Clinical Intake:  Pre-visit preparation completed: Yes  Pain : No/denies pain  Nutritional Risks: None Diabetes: No  How often do you need to have someone help you when you read instructions, pamphlets, or other written materials from your doctor or pharmacy?: 1 - Never   Activities of Daily Living    08/31/2022    8:49 AM  In your present state of health, do you have any difficulty performing the following activities:  Hearing? 0  Vision? 0  Difficulty concentrating or making decisions? 0  Walking or climbing stairs? 0  Dressing or bathing? 0  Doing errands, shopping? 0  Preparing Food and eating ? N  Using the Toilet? N  In the past six months, have you accidently leaked urine? N  Do you have problems with loss of bowel control? N  Managing your Medications? N  Managing your Finances? N  Housekeeping or managing your Housekeeping? N    Patient Care Team: Olive Bass, FNP as PCP - General (Internal Medicine) Crist Fat, MD as Attending Physician (Urology) Margaretmary Dys, MD as Consulting Physician (Radiation Oncology) Rodolph Bong, MD as Consulting Physician (Family Medicine)  Indicate any recent Medical Services you  may have received from other than [facility] providers in the past year (date may be approximate).     Assessment:   This is a routine wellness examination for [place].  Hearing/Vision screen No results found.  Dietary issues and exercise activities discussed: Current Exercise Habits: Home exercise routine, Type of exercise: walking, Time (Minutes): 30, Frequency (Times/Week): 5, Weekly Exercise (Minutes/Week): 150, Intensity: Moderate, Exercise limited by: orthopedic condition(s) (knee arthritis)   Goals Addressed   None    Depression Screen    09/07/2022    8:24 AM 07/18/2022    8:12 AM 03/02/2022    8:18 AM 09/05/2021    4:03 PM 09/07/2020    8:35 AM 09/01/2020    9:18 AM 09/01/2019    9:08 AM  PHQ 2/9 Scores  PHQ - 2 Score 0 0 0 0 0 0 0    Fall Risk    08/31/2022    8:49 AM 07/18/2022    8:12 AM 03/02/2022    8:18 AM 09/05/2021    4:01 PM 09/07/2020    8:34 AM  Fall Risk   Falls in the past year? 0 0 0 0 0  Number falls in past yr: 0 0 0 0 0  Injury with Fall? 0 0 0 0 0  Risk for fall due to : No Fall Risks No Fall Risks No Fall Risks  Other (Comment)  Follow up Falls evaluation completed Falls evaluation completed Falls evaluation completed Falls prevention discussed Falls evaluation completed    FALL RISK PREVENTION PERTAINING TO THE HOME:  Any stairs in or around the home? Yes  If so, are there any without handrails? No  Home free of loose throw rugs in walkways, pet beds, electrical cords, etc? Yes  Adequate lighting in your home to reduce risk of falls? Yes   ASSISTIVE DEVICES UTILIZED TO PREVENT FALLS:  Life alert? No  Use of a cane, walker or w/c? No  Grab bars in the bathroom? No  Shower chair or bench in shower? No  Elevated toilet seat or a handicapped toilet? No  TIMED UP AND GO:  Was the test performed?  No, audio visit .    Cognitive Function:        09/07/2022    8:28 AM 09/01/2019    9:10 AM  6CIT Screen  What Year? 0 points 0 points  What  month? 0 points 0 points  What time? 0 points 0 points  Count back from 20 0 points 0 points  Months in reverse 0 points 0 points  Repeat phrase 0 points 0 points  Total Score 0 points 0 points    Immunizations Immunization History  Administered Date(s) Administered   Fluad Quad(high Dose 65+) 03/02/2022   Influenza, High Dose Seasonal PF 01/17/2017   Influenza-Unspecified 03/11/2021   PFIZER(Purple Top)SARS-COV-2 Vaccination 07/19/2019, 08/09/2019, 03/05/2020   Pfizer Covid-19 Vaccine Bivalent Booster 44yrs & up 01/27/2021   Pneumococcal Conjugate-13 06/23/2013   Pneumococcal Polysaccharide-23 03/20/2012, 07/23/2018   Td 09/20/2007   Tdap 02/22/2018    TDAP status: Up to date  Flu Vaccine status: Up to date  Pneumococcal vaccine status: Up to date  Covid-19 vaccine status: Information provided on how to obtain vaccines.   Qualifies for Shingles Vaccine? Yes   Zostavax completed No   Shingrix Completed?: No.    Education has been provided regarding the importance of this vaccine. Patient has been advised to call insurance company to determine out of pocket expense if they have not yet received this vaccine. Advised may also receive vaccine at local pharmacy or Health Dept. Verbalized acceptance and understanding.  Screening Tests Health Maintenance  Topic Date Due   Zoster Vaccines- Shingrix (1 of 2) Never done   COVID-19 Vaccine (5 - 2023-24 season) 01/13/2022   Medicare Annual Wellness (AWV)  09/06/2022   INFLUENZA VACCINE  12/14/2022   COLONOSCOPY (Pts 45-14yrs Insurance coverage will need to be confirmed)  09/09/2025   DTaP/Tdap/Td (3 - Td or Tdap) 02/23/2028   Pneumonia Vaccine 22+ Years old  Completed   Hepatitis C Screening  Completed   HPV VACCINES  Aged Out    Health Maintenance  Health Maintenance Due  Topic Date Due   Zoster Vaccines- Shingrix (1 of 2) Never done   COVID-19 Vaccine (5 - 2023-24 season) 01/13/2022   Medicare Annual Wellness (AWV)   09/06/2022    Colorectal cancer screening: Type of screening: Colonoscopy. Completed 09/09/20. Repeat every 5 years  Lung Cancer Screening: (Low Dose CT Chest recommended if Age 80-80 years, 30 pack-year currently smoking OR have quit w/in 15years.) does not qualify.   Additional Screening:  Hepatitis C Screening: does qualify; Completed 10/18/15  Vision Screening: Recommended annual ophthalmology exams for early detection of glaucoma and other disorders of the eye. Is the patient up to date with their annual eye exam?  Yes  Who is the provider or what is the name of the office in which the patient attends annual eye exams? [facility] If pt is not established with a provider, would they like to be referred to a provider to establish care? No .   Dental Screening: Recommended annual dental exams for proper oral hygiene  Community Resource Referral / Chronic Care Management: CRR required this visit?  No   CCM required this visit?  No      Plan:     I have personally reviewed and noted the following in the patient's chart:   Medical and social history Use of alcohol, tobacco or illicit drugs  Current medications and supplements including opioid prescriptions. Patient is not currently  taking opioid prescriptions. Functional ability and status Nutritional status Physical activity Advanced directives List of other physicians Hospitalizations, surgeries, and ER visits in previous 12 months Vitals Screenings to include cognitive, depression, and falls Referrals and appointments  In addition, I have reviewed and discussed with patient certain preventive protocols, quality metrics, and best practice recommendations. A written personalized care plan for preventive services as well as general preventive health recommendations were provided to patient.   Due to this being a telephonic visit, the after visit summary with patients personalized plan was offered to patient via mail or  my-chart. Patient would like to access on my-chart.  Donne Anon, [place]   09/07/2022   Nurse Notes: None

## 2022-09-07 NOTE — Patient Instructions (Signed)
Luis Knapp , Thank you for taking time to come for your Medicare Wellness Visit. I appreciate your ongoing commitment to your health goals. Please review the following plan we discussed and let me know if I can assist you in the future.     This is a list of the screening recommended for you and due dates:  Health Maintenance  Topic Date Due   Zoster (Shingles) Vaccine (1 of 2) Never done   COVID-19 Vaccine (5 - 2023-24 season) 01/13/2022   Flu Shot  12/14/2022   Medicare Annual Wellness Visit  09/07/2023   Colon Cancer Screening  09/09/2025   DTaP/Tdap/Td vaccine (3 - Td or Tdap) 02/23/2028   Pneumonia Vaccine  Completed   Hepatitis C Screening: USPSTF Recommendation to screen - Ages 34-79 yo.  Completed   HPV Vaccine  Aged Out     Next appointment: Follow up in one year for your annual wellness visit.   Preventive Care 32 Years and Older, Male Preventive care refers to lifestyle choices and visits with your health care provider that can promote health and wellness. What does preventive care include? A yearly physical exam. This is also called an annual well check. Dental exams once or twice a year. Routine eye exams. Ask your health care provider how often you should have your eyes checked. Personal lifestyle choices, including: Daily care of your teeth and gums. Regular physical activity. Eating a healthy diet. Avoiding tobacco and drug use. Limiting alcohol use. Practicing safe sex. Taking low doses of aspirin every day. Taking vitamin and mineral supplements as recommended by your health care provider. What happens during an annual well check? The services and screenings done by your health care provider during your annual well check will depend on your age, overall health, lifestyle risk factors, and family history of disease. Counseling  Your health care provider may ask you questions about your: Alcohol use. Tobacco use. Drug use. Emotional well-being. Home and  relationship well-being. Sexual activity. Eating habits. History of falls. Memory and ability to understand (cognition). Work and work Astronomer. Screening  You may have the following tests or measurements: Height, weight, and BMI. Blood pressure. Lipid and cholesterol levels. These may be checked every 5 years, or more frequently if you are over 63 years old. Skin check. Lung cancer screening. You may have this screening every year starting at age 34 if you have a 30-pack-year history of smoking and currently smoke or have quit within the past 15 years. Fecal occult blood test (FOBT) of the stool. You may have this test every year starting at age 15. Flexible sigmoidoscopy or colonoscopy. You may have a sigmoidoscopy every 5 years or a colonoscopy every 10 years starting at age 23. Prostate cancer screening. Recommendations will vary depending on your family history and other risks. Hepatitis C blood test. Hepatitis B blood test. Sexually transmitted disease (STD) testing. Diabetes screening. This is done by checking your blood sugar (glucose) after you have not eaten for a while (fasting). You may have this done every 1-3 years. Abdominal aortic aneurysm (AAA) screening. You may need this if you are a current or former smoker. Osteoporosis. You may be screened starting at age 62 if you are at high risk. Talk with your health care provider about your test results, treatment options, and if necessary, the need for more tests. Vaccines  Your health care provider may recommend certain vaccines, such as: Influenza vaccine. This is recommended every year. Tetanus, diphtheria, and acellular pertussis (  Tdap, Td) vaccine. You may need a Td booster every 10 years. Zoster vaccine. You may need this after age 9. Pneumococcal 13-valent conjugate (PCV13) vaccine. One dose is recommended after age 48. Pneumococcal polysaccharide (PPSV23) vaccine. One dose is recommended after age 32. Talk to your  health care provider about which screenings and vaccines you need and how often you need them. This information is not intended to replace advice given to you by your health care provider. Make sure you discuss any questions you have with your health care provider. Document Released: 05/28/2015 Document Revised: 01/19/2016 Document Reviewed: 03/02/2015 Elsevier Interactive Patient Education  2017 [place] Prevention in the Home Falls can cause injuries. They can happen to people of all ages. There are many things you can do to make your home safe and to help prevent falls. What can I do on the outside of my home? Regularly fix the edges of walkways and driveways and fix any cracks. Remove anything that might make you trip as you walk through a door, such as a raised step or threshold. Trim any bushes or trees on the path to your home. Use bright outdoor lighting. Clear any walking paths of anything that might make someone trip, such as rocks or tools. Regularly check to see if handrails are loose or broken. Make sure that both sides of any steps have handrails. Any raised decks and porches should have guardrails on the edges. Have any leaves, snow, or ice cleared regularly. Use sand or salt on walking paths during winter. Clean up any spills in your garage right away. This includes oil or grease spills. What can I do in the bathroom? Use night lights. Install grab bars by the toilet and in the tub and shower. Do not use towel bars as grab bars. Use non-skid mats or decals in the tub or shower. If you need to sit down in the shower, use a plastic, non-slip stool. Keep the floor dry. Clean up any water that spills on the floor as soon as it happens. Remove soap buildup in the tub or shower regularly. Attach bath mats securely with double-sided non-slip rug tape. Do not have throw rugs and other things on the floor that can make you trip. What can I do in the bedroom? Use night  lights. Make sure that you have a light by your bed that is easy to reach. Do not use any sheets or blankets that are too big for your bed. They should not hang down onto the floor. Have a firm chair that has side arms. You can use this for support while you get dressed. Do not have throw rugs and other things on the floor that can make you trip. What can I do in the kitchen? Clean up any spills right away. Avoid walking on wet floors. Keep items that you use a lot in easy-to-reach places. If you need to reach something above you, use a strong step stool that has a grab bar. Keep electrical cords out of the way. Do not use floor polish or wax that makes floors slippery. If you must use wax, use non-skid floor wax. Do not have throw rugs and other things on the floor that can make you trip. What can I do with my stairs? Do not leave any items on the stairs. Make sure that there are handrails on both sides of the stairs and use them. Fix handrails that are broken or loose. Make sure that handrails are  as long as the stairways. Check any carpeting to make sure that it is firmly attached to the stairs. Fix any carpet that is loose or worn. Avoid having throw rugs at the top or bottom of the stairs. If you do have throw rugs, attach them to the floor with carpet tape. Make sure that you have a light switch at the top of the stairs and the bottom of the stairs. If you do not have them, ask someone to add them for you. What else can I do to help prevent falls? Wear shoes that: Do not have high heels. Have rubber bottoms. Are comfortable and fit you well. Are closed at the toe. Do not wear sandals. If you use a stepladder: Make sure that it is fully opened. Do not climb a closed stepladder. Make sure that both sides of the stepladder are locked into place. Ask someone to hold it for you, if possible. Clearly mark and make sure that you can see: Any grab bars or handrails. First and last  steps. Where the edge of each step is. Use tools that help you move around (mobility aids) if they are needed. These include: Canes. Walkers. Scooters. Crutches. Turn on the lights when you go into a dark area. Replace any light bulbs as soon as they burn out. Set up your furniture so you have a clear path. Avoid moving your furniture around. If any of your floors are uneven, fix them. If there are any pets around you, be aware of where they are. Review your medicines with your doctor. Some medicines can make you feel dizzy. This can increase your chance of falling. Ask your doctor what other things that you can do to help prevent falls. This information is not intended to replace advice given to you by your health care provider. Make sure you discuss any questions you have with your health care provider. Document Released: 02/25/2009 Document Revised: 10/07/2015 Document Reviewed: 06/05/2014 Elsevier Interactive Patient Education  2017 [employer].

## 2022-09-26 ENCOUNTER — Ambulatory Visit (INDEPENDENT_AMBULATORY_CARE_PROVIDER_SITE_OTHER): Payer: Medicare Other | Admitting: Family Medicine

## 2022-09-26 ENCOUNTER — Encounter: Payer: Self-pay | Admitting: Family Medicine

## 2022-09-26 VITALS — BP 156/82 | HR 71 | Ht 66.0 in | Wt 241.4 lb

## 2022-09-26 DIAGNOSIS — M1A0720 Idiopathic chronic gout, left ankle and foot, without tophus (tophi): Secondary | ICD-10-CM | POA: Diagnosis not present

## 2022-09-26 DIAGNOSIS — M25562 Pain in left knee: Secondary | ICD-10-CM | POA: Diagnosis not present

## 2022-09-26 DIAGNOSIS — G8929 Other chronic pain: Secondary | ICD-10-CM | POA: Diagnosis not present

## 2022-09-26 LAB — COMPREHENSIVE METABOLIC PANEL
ALT: 25 U/L (ref 0–53)
AST: 20 U/L (ref 0–37)
Albumin: 4.2 g/dL (ref 3.5–5.2)
Alkaline Phosphatase: 68 U/L (ref 39–117)
BUN: 20 mg/dL (ref 6–23)
CO2: 26 mEq/L (ref 19–32)
Calcium: 9.2 mg/dL (ref 8.4–10.5)
Chloride: 101 mEq/L (ref 96–112)
Creatinine, Ser: 1.09 mg/dL (ref 0.40–1.50)
GFR: 67.85 mL/min (ref >60.00)
Glucose, Bld: 105 mg/dL — ABNORMAL HIGH (ref 70–99)
Potassium: 3.8 mEq/L (ref 3.5–5.1)
Sodium: 137 mEq/L (ref 135–145)
Total Bilirubin: 0.4 mg/dL (ref 0.2–1.2)
Total Protein: 6.8 g/dL (ref 6.0–8.3)

## 2022-09-26 LAB — URIC ACID: Uric Acid, Serum: 5.5 mg/dL (ref 4.0–7.8)

## 2022-09-26 NOTE — Patient Instructions (Signed)
Thank you for coming in today.   You should hear from MRI scheduling within 1 week. If you do not hear please let me know.    Please get labs today before you leave   Recheck following the MRI results.

## 2022-09-26 NOTE — Progress Notes (Signed)
I, Stevenson Clinch, CMA acting as a scribe for [name], MD.  Luis Knapp is a 72 y.o. male who presents to [facility] at [facility] today for 6-wk f/u L knee pain. Pt was last seen by Dr. Denyse Amass on 08/15/22 and labs were obtained and his L knee was aspirated and injected and he was advised to use Voltaren gel. Based on uric acid results, he was advised to increase allopurinol dosage to 300mg .  Today, pt reports a few days of improvement following injection. Feels that he irritated the knee a few days after last visit while working in the yard. Sx causing night disturbance. Pain when getting in and out of the car. Pain with internal and external rotation while knee is flexed. Denies visible swellings. Mechanical sx present. Notes radiating pain into medial lower leg.  Overall he does not think the injection worked very well or lasted very long.  Dx testing: 08/15/22 L knee XR & labs 11/17/21 labs   Pertinent review of systems: No fevers or chills  Relevant historical information: Chronic gout currently on allopurinol 300 mg.  Dose was increased last month.   Exam:  BP (!) 156/82   Pulse 71   Ht 5\' 6"  (1.676 m)   Wt 241 lb 6.4 oz (109.5 kg)   SpO2 97%   BMI 38.96 kg/m  General: Well Developed, well nourished, and in no acute distress.   MSK: Left knee: Normal-appearing Normal motion with crepitation.  Tender palpation medial joint line. Pain without laxity with MCL stress test. Positive medial McMurray's test. Intact strength.    Lab and Radiology Results  EXAM: LEFT KNEE 3 VIEWS   COMPARISON:  None Available.   FINDINGS: Joint spaces are preserved. Minimal peripheral patellar degenerative spurs. No joint effusion. No acute fracture or dislocation. Normal bone mineralization.   IMPRESSION: Minimal patellofemoral osteoarthritis. No acute fracture.     Electronically Signed   By: Neita Garnet M.D.   On: 08/17/2022 08:25   I, [name],  personally (independently) visualized and performed the interpretation of the images attached in this note.     Assessment and Plan: 72 y.o. male with chronic left knee pain.  I am concerned that he may have a meniscus tear or worse arthritis than is visible on x-ray.  He has more significant knee pain and dysfunction and did not have good results with steroid injection.  I fear we are missing something.  Plan for MRI of the left knee to further evaluate source of pain and for potential surgical or other injection planning.  Additionally he has chronic gout.  Uric acid was 7.0 when checked a month ago.  Allopurinol was increased from 200 mg to 300 mg.  Will plan on rechecking uric acid today and will go ahead and check metabolic panel as that was last checked July 2023.   PDMP not reviewed this encounter. Orders Placed This Encounter  Procedures   MR Knee Left  Wo Contrast    Standing Status:   Future    Standing Expiration Date:   09/26/2023    Order Specific Question:   What is the patient's sedation requirement?    Answer:   No Sedation    Order Specific Question:   Does the patient have a pacemaker or implanted devices?    Answer:   No    Order Specific Question:   Preferred imaging location?    Answer:   [facility] (table limit-550lbs)  Uric acid    Standing Status:   Future    Number of Occurrences:   1    Standing Expiration Date:   09/26/2023   Comprehensive metabolic panel    Standing Status:   Future    Number of Occurrences:   1    Standing Expiration Date:   09/26/2023   No orders of the defined types were placed in this encounter.    Discussed warning signs or symptoms. Please see discharge instructions. Patient expresses understanding.   The above documentation has been reviewed and is accurate and complete [name], M.D.

## 2022-09-27 NOTE — Progress Notes (Signed)
Uric acid level is perfect at 5.5.  Continue current dose of allopurinol. Kidney labs look the same as they have done 10 months ago. Labs are either better or similar.

## 2022-10-04 ENCOUNTER — Ambulatory Visit
Admission: RE | Admit: 2022-10-04 | Discharge: 2022-10-04 | Disposition: A | Discharge status: Home / Self Care | Payer: Medicare Other | Source: Ambulatory Visit | Attending: Family Medicine | Admitting: Family Medicine

## 2022-10-04 DIAGNOSIS — M1712 Unilateral primary osteoarthritis, left knee: Secondary | ICD-10-CM | POA: Diagnosis not present

## 2022-10-04 DIAGNOSIS — M25462 Effusion, left knee: Secondary | ICD-10-CM | POA: Diagnosis not present

## 2022-10-04 DIAGNOSIS — G8929 Other chronic pain: Secondary | ICD-10-CM

## 2022-10-04 DIAGNOSIS — M23322 Other meniscus derangements, posterior horn of medial meniscus, left knee: Secondary | ICD-10-CM | POA: Diagnosis not present

## 2022-10-04 DIAGNOSIS — M25562 Pain in left knee: Secondary | ICD-10-CM | POA: Diagnosis not present

## 2022-10-12 ENCOUNTER — Encounter: Payer: Self-pay | Admitting: Family Medicine

## 2022-10-13 ENCOUNTER — Encounter: Payer: Self-pay | Admitting: Family Medicine

## 2022-10-13 NOTE — Progress Notes (Signed)
Left knee MRI shows worse arthritis than your x-ray showed.  You have areas of full-thickness cartilage loss at the weightbearing section of your knee.  This is much worse than was on x-ray.  This is very likely the main source of your pain.  You do have a meniscus tear which could be constipating surgery for this likely will be a knee replacement if we can get you better.  Once we get the next step injections authorized recommend return to clinic for injection as well as go over this MRI report and pictures.

## 2022-10-16 ENCOUNTER — Telehealth: Payer: Self-pay

## 2022-10-16 DIAGNOSIS — M1711 Unilateral primary osteoarthritis, right knee: Secondary | ICD-10-CM

## 2022-10-16 NOTE — Telephone Encounter (Signed)
Rodolph Bong, MD  Dierdre Searles, CMA Please Luis Knapp (preferred) and gel shots left knee

## 2022-10-16 NOTE — Telephone Encounter (Signed)
Luis Knapp, Luis S, MD  [name], CMA Please auth Zilretta (preferred) and gel shots left knee 

## 2022-10-16 NOTE — Telephone Encounter (Signed)
VOB initiated for ZILRETTA for LEFT knee OA.   Also checking Orthovisc.

## 2022-10-16 NOTE — Telephone Encounter (Signed)
VOB initiated for Orthovisc for LEFT knee OA.  

## 2022-10-17 NOTE — Telephone Encounter (Signed)
Orthovisc for LEFT knee OA (Also checking Zilretta)   Primary Insurance: Medicare Co-Pay: n/a Co-Insurance: 20% Deductible: $240 of $240 met Prior Auth: NOT required   Secondary Insurance: AARP Medicare Supp Plan C Co-Pay: n/a Co-Insurance: covers Medicare co-insurance Deductible: covers Medicare deductible of which $240 of $240 has been met Prior Auth: NOT required     Per MRI report 10/04/22:    Rodolph Bong, MD 10/13/2022  6:27 AM EDT      Left knee MRI shows worse arthritis than your x-ray showed.  You have areas of full-thickness cartilage loss at the weightbearing section of your knee.  This is much worse than was on x-ray.  This is very likely the main source of your pain.  You do have a meniscus tear which could be constipating surgery for this likely will be a knee replacement if we can get you better.  Once we get the next step injections authorized recommend return to clinic for injection as well as go over this MRI report and pictures.

## 2022-10-17 NOTE — Telephone Encounter (Addendum)
Zilretta for LEFT knee OA (Also checking Orthovisc)  Primary Insurance: Medicare Co-Pay: n/a Co-Insurance: 20% Deductible: $240 of $240 met Prior Auth: NOT required  Secondary Insurance: AARP Medicare Supp Plan C Co-Pay: n/a Co-Insurance: covers Medicare co-insurance Deductible: covers Medicare deductible of which $240 of $240 has been met Prior Auth: NOT required     Per MRI report 10/04/22:   Rodolph Bong, MD 10/13/2022  6:27 AM EDT     Left knee MRI shows worse arthritis than your x-ray showed.  You have areas of full-thickness cartilage loss at the weightbearing section of your knee.  This is much worse than was on x-ray.  This is very likely the main source of your pain.  You do have a meniscus tear which could be constipating surgery for this likely will be a knee replacement if we can get you better.  Once we get the next step injections authorized recommend return to clinic for injection as well as go over this MRI report and pictures.

## 2022-10-17 NOTE — Telephone Encounter (Signed)
Scheduled

## 2022-10-19 NOTE — Progress Notes (Signed)
Luis Payor, PhD, LAT, ATC acting as a scribe for [name], MD.  Luis Knapp is a 72 y.o. male who presents to [facility] at [facility] today for f/u L knee pain w/ MRI review. Pt was last seen by Dr. Denyse Amass on 09/26/22 and his uric acid was re-checked and he was advised to proceed to MRI.   Today, pt reports minimal change in sx since last visit. Intermittent pain and swelling. The knee is not giving out. No meds for sx since las visit. No relief with Voltaren. BioFreeze helped a little more.   Dx testing: 10/04/22 L knee MRI  09/26/22 Labs 08/15/22 L knee XR & labs 11/17/21 labs  Pertinent review of systems: No fevers or chills  Relevant historical information: Hypertension.  History of prostate cancer.   Exam:  BP (!) 144/70   Pulse 78   Wt 239 lb 9.6 oz (108.7 kg)   SpO2 96%   BMI 38.67 kg/m  General: Well Developed, well nourished, and in no acute distress.   MSK: Left knee mild effusion normal-appearing otherwise normal motion.    Lab and Radiology Results  Zilretta injection Procedure: Real-time Ultrasound Guided Injection of left knee joint superior lateral patellar space Device: Philips Affiniti 50G Images permanently stored and available for review in PACS Verbal informed consent obtained.  Discussed risks and benefits of procedure. Warned about infection, bleeding, hyperglycemia damage to structures among others. Patient expresses understanding and agreement Time-out conducted.   Noted no overlying erythema, induration, or other signs of local infection.   Skin prepped in a sterile fashion.   Local anesthesia: Topical Ethyl chloride.   With sterile technique and under real time ultrasound guidance: Zilretta 32 mg injected into knee joint. Fluid seen entering the joint capsule.   Completed without difficulty   Advised to call if fevers/chills, erythema, induration, drainage, or persistent bleeding.   Images permanently stored and  available for review in the ultrasound unit.  Impression: Technically successful ultrasound guided injection. Lot number: [id number]      EXAM: MRI OF THE LEFT KNEE WITHOUT CONTRAST   TECHNIQUE: Multiplanar, multisequence MR imaging of the left knee was performed. No intravenous contrast was administered.   COMPARISON:  Radiographs dated August 15, 2022   FINDINGS: MENISCI   Medial: Complex degenerative tear of the body/posterior horn junction of the medial meniscus.   Lateral: Intact.   LIGAMENTS   Cruciates: ACL and PCL are intact.   Collaterals: Mild edema along the medial collateral ligament suggesting ligamentous sprain without evidence of tear. Lateral collateral ligament complex is intact.   CARTILAGE   Patellofemoral: Focal full-thickness defect at the patellar apex. Focal full-thickness defect of the medial trochlea with subchondral cystic changes.   Medial:  Full-thickness cartilage loss at the weight-bearing area.   Lateral:  No chondral defect.   JOINT: Large joint effusion. Normal Hoffa's fat-pad. No plical thickening.   POPLITEAL FOSSA: Popliteus tendon is intact. No Baker's cyst.   EXTENSOR MECHANISM: Intact quadriceps tendon. Intact patellar tendon. Intact lateral patellar retinaculum. Intact medial patellar retinaculum. Intact MPFL.   BONES: No aggressive osseous lesion. No fracture or dislocation.   Other: No fluid collection or hematoma. Muscles are normal.   IMPRESSION: 1. Mild-to-moderate medial tibiofemoral osteoarthritis characterized by complex tear of the body/posterior horn of the medial meniscus and full-thickness cartilage loss at the weight-bearing area with mild subchondral edema. 2. Mild-to-moderate patellofemoral osteoarthritis. 3. Large suprapatellar joint effusion. 4. Cruciate and collateral ligaments are  intact. Quadriceps tendon and patellar tendon are also intact. 5. No evidence of fracture or osteonecrosis.      Electronically Signed   By: Larose Hires D.O.   On: 10/12/2022 21:32   I, [name], personally (independently) visualized and performed the interpretation of the images attached in this note.  Lab Results  Component Value Date   LABURIC 5.5 09/26/2022      Assessment and Plan: 72 y.o. male with left knee pain primarily due to the significant DJD seen on his MRI.  This is more than was apparent on prior x-ray.  He has areas of full-thickness cartilage loss especially in the weightbearing areas of the medial knee joint.  Plan for Zilretta injection.  This could be quite helpful.  If not better could consider gel shots.   Additionally during the last visit uric acid was checked and was found to be at a good level at 5.5.  PDMP not reviewed this encounter. Orders Placed This Encounter  Procedures   [place] LIMITED JOINT SPACE STRUCTURES LOW LEFT(NO LINKED CHARGES)    Order Specific Question:   Reason for Exam (SYMPTOM  OR DIAGNOSIS REQUIRED)    Answer:   left knee pain    Order Specific Question:   Preferred imaging location?    Answer:   Adult nurse [facility] ordered this encounter  Medications   Triamcinolone Acetonide (ZILRETTA) intra-articular injection 32 mg     Discussed warning signs or symptoms. Please see discharge instructions. Patient expresses understanding.   The above documentation has been reviewed and is accurate and complete [name], M.D.

## 2022-10-20 ENCOUNTER — Other Ambulatory Visit: Payer: Self-pay

## 2022-10-20 ENCOUNTER — Ambulatory Visit (INDEPENDENT_AMBULATORY_CARE_PROVIDER_SITE_OTHER): Payer: Medicare Other | Admitting: Family Medicine

## 2022-10-20 VITALS — BP 144/70 | HR 78 | Wt 239.6 lb

## 2022-10-20 DIAGNOSIS — G8929 Other chronic pain: Secondary | ICD-10-CM

## 2022-10-20 DIAGNOSIS — M25562 Pain in left knee: Secondary | ICD-10-CM | POA: Diagnosis not present

## 2022-10-20 DIAGNOSIS — M1712 Unilateral primary osteoarthritis, left knee: Secondary | ICD-10-CM

## 2022-10-20 MED ORDER — TRIAMCINOLONE ACETONIDE 32 MG IX SRER
32.0000 mg | Freq: Once | INTRA_ARTICULAR | Status: AC
  Administered 2022-10-20: 32 mg via INTRA_ARTICULAR

## 2022-10-20 NOTE — Patient Instructions (Addendum)
Thank you for coming in today.   You received an injection today. Seek immediate medical attention if the joint becomes red, extremely painful, or is oozing fluid.   We can try the knee brace if needed  Genicular Artery Embolization for Chronic Knee Pain

## 2022-10-23 NOTE — Telephone Encounter (Signed)
Pt received Zilretta inj 10/20/22.

## 2022-10-23 NOTE — Telephone Encounter (Signed)
Pt received Zilretta inj for LEFT knee OA 10/20/22.   Can consider repeat on or after 01/13/23

## 2022-10-24 DIAGNOSIS — R9721 Rising PSA following treatment for malignant neoplasm of prostate: Secondary | ICD-10-CM | POA: Diagnosis not present

## 2022-10-31 DIAGNOSIS — Z8546 Personal history of malignant neoplasm of prostate: Secondary | ICD-10-CM | POA: Diagnosis not present

## 2022-10-31 DIAGNOSIS — N393 Stress incontinence (female) (male): Secondary | ICD-10-CM | POA: Diagnosis not present

## 2022-10-31 DIAGNOSIS — N5231 Erectile dysfunction following radical prostatectomy: Secondary | ICD-10-CM | POA: Diagnosis not present

## 2022-11-08 NOTE — Telephone Encounter (Signed)
VOB initiated for Zilretta for RIGHT knee OA.  Already received inj for LEFT knee OA.

## 2022-11-08 NOTE — Telephone Encounter (Signed)
Will run VOB for RIGHT knee OA.

## 2022-11-08 NOTE — Telephone Encounter (Signed)
Per pt message:  Luis Knapp  [facility] (supporting Rodolph Bong, MD)22 hours ago (3:42 PM)    FYI My left knee is doing great since the injection.  I'd like to see if we could do the same thing on the other knee.   Also, I told my friend in [place] about the procedure you told me about for patients who can't have knee replacement.   He asked me for your name and I gave him your name and office number.  His name is Alisa Graff.  He's 72 years old and needs help getting around, uses an electric scooter most of the time.  I hope you can help him.

## 2022-11-09 NOTE — Telephone Encounter (Signed)
Zilretta for RIGHT knee OA (already received for LEFT knee)  Primary insurance: Medicare Co-pay: n/a Co-insurance: 20% Deductible: $240 of $240 met Prior Auth: NOT required  Secondary insurance: AARP Medicare Supp Plan C Co-pay: n/a Co-insurance: covers 20% Medicare co-insurance Deductible: covers $240 Medicare deductible Prior Auth: NOT required  Zilretta for LEFT knee OA 10/20/22

## 2022-11-14 NOTE — Telephone Encounter (Signed)
Scheduled

## 2022-11-21 ENCOUNTER — Telehealth: Payer: Self-pay

## 2022-11-21 NOTE — Telephone Encounter (Signed)
I called Luis Knapp to ask if he could come in today for Zilretta but I had to leave a VM. We are doing his other knee so if he can come in today we are good to go.

## 2022-11-23 ENCOUNTER — Other Ambulatory Visit: Payer: Self-pay

## 2022-11-23 ENCOUNTER — Ambulatory Visit: Payer: Medicare Other | Admitting: Family Medicine

## 2022-11-23 VITALS — BP 144/70 | HR 78 | Ht 66.0 in

## 2022-11-23 DIAGNOSIS — G8929 Other chronic pain: Secondary | ICD-10-CM | POA: Diagnosis not present

## 2022-11-23 DIAGNOSIS — M25561 Pain in right knee: Secondary | ICD-10-CM | POA: Diagnosis not present

## 2022-11-23 DIAGNOSIS — M1712 Unilateral primary osteoarthritis, left knee: Secondary | ICD-10-CM

## 2022-11-23 DIAGNOSIS — M1711 Unilateral primary osteoarthritis, right knee: Secondary | ICD-10-CM | POA: Diagnosis not present

## 2022-11-23 MED ORDER — TRIAMCINOLONE ACETONIDE 32 MG IX SRER
32.0000 mg | Freq: Once | INTRA_ARTICULAR | Status: AC
  Administered 2022-11-23: 32 mg via INTRA_ARTICULAR

## 2022-11-23 NOTE — Patient Instructions (Signed)
Thank you for coming in today.   You received an injection today. Seek immediate medical attention if the joint becomes red, extremely painful, or is oozing fluid.  

## 2022-11-23 NOTE — Progress Notes (Signed)
   [name], PhD, LAT, ATC acting as a scribe for [name], MD.  Luis Knapp is a 72 y.o. male who presents to [facility] at [facility] today for R knee pain and possible Zilretta injection. Pt was last seen by Dr. Denyse Amass on 10/20/22 and was given a L knee Zilretta injection.  Today, pt reports his L knee has been feeling good. He is wanting to proceed w/ the contralateral side today.  Dx testing: 10/04/22 L knee MRI             09/26/22 Labs 08/15/22 L knee XR & labs 11/17/21 labs 08/19/19 R knee XR   Pertinent review of systems: No fevers or chills  Relevant historical information: Hypertension.  History of prostate cancer.   Exam:  BP (!) 144/70   Pulse 78   Ht 5\' 6"  (1.676 m)   SpO2 96%   BMI 38.67 kg/m  General: Well Developed, well nourished, and in no acute distress.   MSK: Right knee mild effusion normal-appearing otherwise.  Normal motion with crepitation. Tender palpation medial joint line.    Lab and Radiology Results  Zilretta injection right knee Procedure: Real-time Ultrasound Guided Injection of right knee joint superior lateral patella space Device: Philips Affiniti 50G/GE Logiq Images permanently stored and available for review in PACS Verbal informed consent obtained.  Discussed risks and benefits of procedure. Warned about infection, bleeding, hyperglycemia damage to structures among others. Patient expresses understanding and agreement Time-out conducted.   Noted no overlying erythema, induration, or other signs of local infection.   Skin prepped in a sterile fashion.   Local anesthesia: Topical Ethyl chloride.   With sterile technique and under real time ultrasound guidance: Zilretta 32 mg injected into knee joint. Fluid seen entering the joint capsule.   Completed without difficulty   Advised to call if fevers/chills, erythema, induration, drainage, or persistent bleeding.   Images permanently stored and available for review  in the ultrasound unit.  Impression: Technically successful ultrasound guided injection. Lot number: [id number]      Assessment and Plan: 72 y.o. 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  Plan for Zilretta injection.  He had a left knee Zilretta injection last month that worked really well.  If we can stretch his left knee out for months we could do the right knee and the left knee at the same time in about 3 months if needed which would be October 11.   PDMP not reviewed this encounter. Orders Placed This Encounter  Procedures   [place] LIMITED JOINT SPACE STRUCTURES LOW LEFT(NO LINKED CHARGES)    Order Specific Question:   Reason for Exam (SYMPTOM  OR DIAGNOSIS REQUIRED)    Answer:   left knee pain    Order Specific Question:   Preferred imaging location?    Answer:   Adult nurse [facility] ordered this encounter  Medications   Triamcinolone Acetonide (ZILRETTA) intra-articular injection 32 mg     Discussed warning signs or symptoms. Please see discharge instructions. Patient expresses understanding.   The above documentation has been reviewed and is accurate and complete [name], M.D.

## 2022-11-28 ENCOUNTER — Encounter: Payer: Self-pay | Admitting: Family

## 2022-11-28 ENCOUNTER — Ambulatory Visit: Payer: Medicare Other | Admitting: Family

## 2022-11-28 VITALS — BP 146/72 | HR 79 | Ht 66.0 in | Wt 237.8 lb

## 2022-11-28 DIAGNOSIS — I10 Essential (primary) hypertension: Secondary | ICD-10-CM | POA: Diagnosis not present

## 2022-11-28 MED ORDER — AMLODIPINE BESYLATE 5 MG PO TABS: 5.0000 mg | ORAL_TABLET | Freq: Every evening | ORAL | 1 refills | Status: DC

## 2022-11-28 NOTE — Progress Notes (Signed)
Luis Knapp is a 72 y.o. 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 90 tablet 1   amLODipine (NORVASC) 5 MG tablet Take 1 tablet (5 mg total) by mouth at bedtime. 90 tablet 1   Cholecalciferol (VITAMIN D-3) 1000 units CAPS Take 1 capsule by mouth daily.     Coenzyme Q10 (COQ-10) 100 MG CAPS Take by mouth.     colchicine 0.6 MG tablet TAKE ONE TABLET BY MOUTH DAILY AS NEEDED FOR GOUT OR PSEUDO-GOUT PAIN 30 tablet 2   Multiple Vitamins-Minerals (MULTIVITAMIN ADULT PO) Take 1 tablet by mouth daily.     rosuvastatin (CRESTOR) 10 MG tablet Take 1 tablet (10 mg total) by mouth daily. 90 tablet 3   valsartan-hydrochlorothiazide (DIOVAN-HCT) 320-25 MG tablet Take 1 tablet by mouth daily. 90 tablet 1   No current facility-administered medications for this visit.    Allergies: Patient has no known allergies.  Past Medical History:  Diagnosis Date   Allergic rhinitis due to pollen    Allergy    Diverticulosis of colon (without mention of hemorrhage)    GERD (gastroesophageal reflux disease)    History of colon polyps    2008--  PRE CANEROUS/   2013-- ADENOMA   Prostate cancer (HCC) 11/01/2016   prostate cancer   [name]    Unspecified essential hypertension    Wears glasses     Past Surgical History:  Procedure Laterality  Date   COLONOSCOPY     COLONOSCOPY WITH PROPOFOL  10-30-2011  &  2008   POLYPECTOMY   HYDROCELE EXCISION Right 07/18/2013   Procedure: RIGHT HYDROCELECTOMY ADULT;  Surgeon: Crist Fat, MD;  Location: [facility];  Service: Urology;  Laterality: Right;   LYMPH NODE DISSECTION Bilateral 11/01/2016   Procedure: BILATERAL PELVIC LYMPH NODE DISSECTION;  Surgeon: Crist Fat, MD;  Location: [facility];  Service: Urology;  Laterality: Bilateral;   POLYPECTOMY     ROBOT ASSISTED LAPAROSCOPIC RADICAL PROSTATECTOMY N/A 11/01/2016   Procedure: XI ROBOTIC ASSISTED LAPAROSCOPIC RADICAL PROSTATECTOMY;  Surgeon: Crist Fat, MD;  Location: [facility];  Service: Urology;  Laterality: N/A;    Family History  Problem Relation Age of Onset   Coronary artery disease Father    Heart attack Father        2nd one fatal   Pulmonary embolism Father        ?   Dementia Father    Heart disease Father    Colon cancer Father    Dementia Mother    Hypertension Mother    Macular degeneration Mother    Kidney disease Mother    Diabetes Sister    Non-Hodgkin's lymphoma Sister    Colon cancer Maternal Grandfather    Pancreatic cancer Sister    Prostate cancer Neg Hx    Rectal cancer Neg Hx  Colon polyps Neg Hx    Esophageal cancer Neg Hx     Social History   Tobacco Use   Smoking status: Former    Current packs/day: 0.00    Types: Cigarettes    Quit date: 10/17/1995    Years since quitting: 27.1   Smokeless tobacco: Never  Substance Use Topics   Alcohol use: No    Subjective:  Presents for blood pressure follow up; currently on Valsartan HCT; concerned that pressure not as well controlled as needs to be; readings are consistent with what is seen in the office today;  notes that has been having increased knee pain and affecting ability to walk which in turn has caused weight gain; Denies any chest pain, shortness of breath, blurred vision or headache.    Objective:   Vitals:   11/28/22 0803  BP: (!) 146/72  Pulse: 79  SpO2: 98%  Weight: 237 lb 12.8 oz (107.9 kg)  Height: 5\' 6"  (1.676 m)    General: Well developed, well nourished, in no acute distress  Skin : Warm and dry.  Head: Normocephalic and atraumatic  Eyes: Sclera and conjunctiva clear; pupils round and reactive to light; extraocular movements intact  Ears: External normal; canals clear; tympanic membranes normal  Oropharynx: Pink, supple. No suspicious lesions  Neck: Supple without thyromegaly, adenopathy  Lungs: Respirations unlabored; clear to auscultation bilaterally without wheeze, rales, rhonchi  CVS exam: normal rate and regular rhythm.  Neurologic: Alert and oriented; speech intact; face symmetrical; moves all extremities well; CNII-XII intact without focal deficit   Assessment:  1. Essential hypertension     Plan:  Add Amlodipine 5 mg; he will take this at night and continue his Valsartan HCT in the am; he will send MyChart message in about 2 weeks with response; plan for in office follow up in 4 weeks.   No follow-ups on file.  No orders of the defined types were placed in this encounter.   Requested Prescriptions   Signed Prescriptions Disp Refills   amLODipine (NORVASC) 5 MG tablet 90 tablet 1    Sig: Take 1 tablet (5 mg total) by mouth at bedtime.

## 2022-11-28 NOTE — Telephone Encounter (Signed)
Pt received Zilretta inj for RIGHT knee OA on 11/23/22. Can consider repeat on or after 02/16/23.

## 2023-01-01 ENCOUNTER — Encounter: Payer: Self-pay | Admitting: Family

## 2023-01-03 ENCOUNTER — Other Ambulatory Visit: Payer: Self-pay | Admitting: Family

## 2023-01-24 DIAGNOSIS — Z23 Encounter for immunization: Secondary | ICD-10-CM | POA: Diagnosis not present

## 2023-02-09 ENCOUNTER — Other Ambulatory Visit: Payer: Self-pay | Admitting: Family Medicine

## 2023-02-09 NOTE — Telephone Encounter (Signed)
Rx refill request approved per Dr. Corey's orders. 

## 2023-02-22 DIAGNOSIS — L570 Actinic keratosis: Secondary | ICD-10-CM | POA: Diagnosis not present

## 2023-02-22 DIAGNOSIS — L821 Other seborrheic keratosis: Secondary | ICD-10-CM | POA: Diagnosis not present

## 2023-02-22 DIAGNOSIS — L738 Other specified follicular disorders: Secondary | ICD-10-CM | POA: Diagnosis not present

## 2023-02-26 ENCOUNTER — Telehealth: Payer: Self-pay | Admitting: Family Medicine

## 2023-02-26 NOTE — Telephone Encounter (Signed)
Patient called requesting authorization for bilateral Zilretta injections.  Please advise.

## 2023-02-26 NOTE — Telephone Encounter (Addendum)
Morphies, Carson J7 hours ago (8:47 AM)   CM Patient called requesting authorization for bilateral Zilretta injections.   Please advise.          VOB initiated for Zilretta for BILAT knee OA.  Had Zilretta for RIGHT knee OA 11/23/22 Had Zilretta for LEFT knee OA 10/20/22

## 2023-02-26 NOTE — Telephone Encounter (Signed)
See Zilretta VOB telephone encounter.

## 2023-02-27 NOTE — Telephone Encounter (Signed)
Pending

## 2023-03-06 NOTE — Telephone Encounter (Signed)
No Prior Auth required for [employer]  Primary: Medicare     Secondary: UHC AARP

## 2023-03-06 NOTE — Telephone Encounter (Signed)
Holding until stock comes in.

## 2023-03-06 NOTE — Telephone Encounter (Signed)
ZILRETTA for BILAT knee OA  OK to schedule for BILAT knee OA on or after 02/16/23  Primary Insurance: Medicare Co-pay: n/a Co-insurance: 20% Deductible: $240 of $240 met Prior Auth: NOT required  Secondary: AARP Medicare Supplement Plan C Co-pay: n/a Co-insurance: covers 20% Medicare co-insurance Deductible: does not apply Prior Auth NOT required   Knee Injection History 08/22/19 - Kenalog RIGHT 08/15/22 - Kenalog LEFT 10/20/22 - Zilretta LEFT 11/23/22 - Zilretta RIGHT

## 2023-03-09 NOTE — Telephone Encounter (Signed)
ZILRETTA for BILAT knee OA  OK to schedule for BILAT knee OA on or after 02/16/23  Primary Insurance: Medicare Co-pay: n/a Co-insurance: 20% Deductible: $240 of $240 met Prior Auth: NOT required  Secondary: AARP Medicare Supplement Plan C Co-pay: n/a Co-insurance: covers 20% Medicare co-insurance Deductible: does not apply Prior Auth NOT required   Knee Injection History 08/22/19 - Kenalog RIGHT 08/15/22 - Kenalog LEFT 10/20/22 - Zilretta LEFT 11/23/22 - Zilretta RIGHT

## 2023-03-12 NOTE — Telephone Encounter (Signed)
Scheduled

## 2023-03-15 ENCOUNTER — Ambulatory Visit (INDEPENDENT_AMBULATORY_CARE_PROVIDER_SITE_OTHER): Payer: Medicare Other | Admitting: Family Medicine

## 2023-03-15 ENCOUNTER — Other Ambulatory Visit: Payer: Self-pay

## 2023-03-15 VITALS — BP 162/68 | HR 78 | Ht 66.0 in | Wt 240.0 lb

## 2023-03-15 DIAGNOSIS — G8929 Other chronic pain: Secondary | ICD-10-CM

## 2023-03-15 DIAGNOSIS — M25562 Pain in left knee: Secondary | ICD-10-CM

## 2023-03-15 DIAGNOSIS — M170 Bilateral primary osteoarthritis of knee: Secondary | ICD-10-CM | POA: Diagnosis not present

## 2023-03-15 DIAGNOSIS — M25561 Pain in right knee: Secondary | ICD-10-CM

## 2023-03-15 MED ORDER — TRIAMCINOLONE ACETONIDE 32 MG IX SRER
32.0000 mg | Freq: Once | INTRA_ARTICULAR | Status: AC
  Administered 2023-03-15: 32 mg via INTRA_ARTICULAR

## 2023-03-15 NOTE — Progress Notes (Signed)
[name], PhD, LAT, ATC acting as a scribe for [name], MD.  Luis Knapp is a 72 y.o. male who presents to [facility] at [facility] today for cont'd bilat knee pain. Pt was last seen by Dr. Denyse Amass on 11/23/22 and was given a R knee Zilretta injection. Last L knee Zilretta injection was on 10/20/22.  Today, pt reports both knees started hurting about 2-3 wks ago. He is wanting to do Zilretta bilaterally today.  Dx testing: 10/04/22 L knee MRI             09/26/22 Labs 08/15/22 L knee XR & labs 11/17/21 labs 08/19/19 R knee XR  Pertinent review of systems: No fevers or chills  Relevant historical information: Hypertension.  History of prostate cancer.   Exam:  BP (!) 162/68   Pulse 78   Ht 5\' 6"  (1.676 m)   Wt 240 lb (108.9 kg)   SpO2 96%   BMI 38.74 kg/m  General: Well Developed, well nourished, and in no acute distress.   MSK: Bilateral knees moderate effusion.  Normal motion.    Lab and Radiology Results   Zilretta injection bilateral knee Procedure: Real-time Ultrasound Guided Injection of right knee joint superior lateral patellar space Device: Philips Affiniti 50G Images permanently stored and available for review in PACS Verbal informed consent obtained.  Discussed risks and benefits of procedure. Warned about infection, hyperglycemia bleeding, damage to structures among others. Patient expresses understanding and agreement Time-out conducted.   Noted no overlying erythema, induration, or other signs of local infection.   Skin prepped in a sterile fashion.   Local anesthesia: Topical Ethyl chloride.   With sterile technique and under real time ultrasound guidance: Zilretta 32 mg injected into knee joint. Fluid seen entering the joint capsule.   Completed without difficulty   Advised to call if fevers/chills, erythema, induration, drainage, or persistent bleeding.   Images permanently stored and available for review in the ultrasound  unit.  Impression: Technically successful ultrasound guided injection.   Procedure: Real-time Ultrasound Guided Injection of left knee joint superior lateral patellar space Device: Philips Affiniti 50G Images permanently stored and available for review in PACS Verbal informed consent obtained.  Discussed risks and benefits of procedure. Warned about infection, hyperglycemia bleeding, damage to structures among others. Patient expresses understanding and agreement Time-out conducted.   Noted no overlying erythema, induration, or other signs of local infection.   Skin prepped in a sterile fashion.   Local anesthesia: Topical Ethyl chloride.   With sterile technique and under real time ultrasound guidance: Zilretta 32 mg injected into knee joint. Fluid seen entering the joint capsule.   Completed without difficulty   Advised to call if fevers/chills, erythema, induration, drainage, or persistent bleeding.   Images permanently stored and available for review in the ultrasound unit.  Impression: Technically successful ultrasound guided injection.  Lot number: [id number]      Assessment and Plan: 72 y.o. male with bilateral knee pain due to DJD.  Plan for repeat Zilretta injection today.  Recheck as needed.  Could consider repeat injection in 3 months.  He will notify us before injection for authorization.   PDMP not reviewed this encounter. Orders Placed This Encounter  Procedures   [place] LIMITED JOINT SPACE STRUCTURES LOW BILAT(NO LINKED CHARGES)    Order Specific Question:   Reason for Exam (SYMPTOM  OR DIAGNOSIS REQUIRED)    Answer:   bilateral knee pain    Order Specific Question:  Preferred imaging location?    Answer:    [facility] ordered this encounter  Medications   Triamcinolone Acetonide (ZILRETTA) intra-articular injection 32 mg   Triamcinolone Acetonide (ZILRETTA) intra-articular injection 32 mg     Discussed warning signs or symptoms.  Please see discharge instructions. Patient expresses understanding.   The above documentation has been reviewed and is accurate and complete [name], M.D.

## 2023-03-15 NOTE — Patient Instructions (Signed)
Thank you for coming in today.   Call or go to the ER if you develop a large red swollen joint with extreme pain or oozing puss.   Let us know when you are ready for the next shot in about 3 months.

## 2023-03-15 NOTE — Telephone Encounter (Signed)
Pt received Zilretta inj for BILAT knee OA 03/15/23.  Can consider repeat injections on or after 06/08/23.

## 2023-04-03 ENCOUNTER — Encounter: Payer: Self-pay | Admitting: Family

## 2023-04-03 ENCOUNTER — Ambulatory Visit (INDEPENDENT_AMBULATORY_CARE_PROVIDER_SITE_OTHER): Payer: Medicare Other | Admitting: Family

## 2023-04-03 VITALS — BP 138/84 | HR 90 | Resp 18 | Ht 66.0 in | Wt 238.6 lb

## 2023-04-03 DIAGNOSIS — I10 Essential (primary) hypertension: Secondary | ICD-10-CM

## 2023-04-03 DIAGNOSIS — R7989 Other specified abnormal findings of blood chemistry: Secondary | ICD-10-CM

## 2023-04-03 DIAGNOSIS — R252 Cramp and spasm: Secondary | ICD-10-CM

## 2023-04-03 DIAGNOSIS — E785 Hyperlipidemia, unspecified: Secondary | ICD-10-CM

## 2023-04-03 DIAGNOSIS — J209 Acute bronchitis, unspecified: Secondary | ICD-10-CM

## 2023-04-03 LAB — CBC WITH DIFFERENTIAL/PLATELET
Basophils Absolute: 0.1 10*3/uL (ref 0.0–0.1)
Basophils Relative: 0.7 % (ref 0.0–3.0)
Eosinophils Absolute: 0.3 10*3/uL (ref 0.0–0.7)
Eosinophils Relative: 4 % (ref 0.0–5.0)
HCT: 43.5 % (ref 39.0–52.0)
Hemoglobin: 14.5 g/dL (ref 13.0–17.0)
Lymphocytes Relative: 8.8 % — ABNORMAL LOW (ref 12.0–46.0)
Lymphs Abs: 0.7 10*3/uL (ref 0.7–4.0)
MCHC: 33.4 g/dL (ref 30.0–36.0)
MCV: 91.1 fL (ref 78.0–100.0)
Monocytes Absolute: 0.7 10*3/uL (ref 0.1–1.0)
Monocytes Relative: 9.1 % (ref 3.0–12.0)
Neutro Abs: 5.9 10*3/uL (ref 1.4–7.7)
Neutrophils Relative %: 77.4 % — ABNORMAL HIGH (ref 43.0–77.0)
Platelets: 256 10*3/uL (ref 150.0–400.0)
RBC: 4.78 Mil/uL (ref 4.22–5.81)
RDW: 13.5 % (ref 11.5–15.5)
WBC: 7.6 10*3/uL (ref 4.0–10.5)

## 2023-04-03 LAB — LIPID PANEL
Cholesterol: 118 mg/dL (ref 0–200)
HDL: 52.7 mg/dL (ref >39.00)
LDL Cholesterol: 38 mg/dL (ref 0–99)
NonHDL: 64.88
Total CHOL/HDL Ratio: 2
Triglycerides: 135 mg/dL (ref 0.0–149.0)
VLDL: 27 mg/dL (ref 0.0–40.0)

## 2023-04-03 LAB — COMPREHENSIVE METABOLIC PANEL
ALT: 27 U/L (ref 0–53)
AST: 23 U/L (ref 0–37)
Albumin: 4.5 g/dL (ref 3.5–5.2)
Alkaline Phosphatase: 77 U/L (ref 39–117)
BUN: 20 mg/dL (ref 6–23)
CO2: 31 meq/L (ref 19–32)
Calcium: 9.4 mg/dL (ref 8.4–10.5)
Chloride: 97 meq/L (ref 96–112)
Creatinine, Ser: 1.09 mg/dL (ref 0.40–1.50)
GFR: 67.6 mL/min (ref >60.00)
Glucose, Bld: 108 mg/dL — ABNORMAL HIGH (ref 70–99)
Potassium: 3.6 meq/L (ref 3.5–5.1)
Sodium: 139 meq/L (ref 135–145)
Total Bilirubin: 0.6 mg/dL (ref 0.2–1.2)
Total Protein: 6.8 g/dL (ref 6.0–8.3)

## 2023-04-03 LAB — VITAMIN B12: Vitamin B-12: 465 pg/mL (ref 211–911)

## 2023-04-03 LAB — MAGNESIUM: Magnesium: 2.2 mg/dL (ref 1.5–2.5)

## 2023-04-03 MED ORDER — VALSARTAN-HYDROCHLOROTHIAZIDE 320-25 MG PO TABS: 1.0000 | ORAL_TABLET | Freq: Every day | ORAL | 3 refills | Status: AC

## 2023-04-03 MED ORDER — AMLODIPINE BESYLATE 5 MG PO TABS: 5.0000 mg | ORAL_TABLET | Freq: Every evening | ORAL | 3 refills | Status: DC

## 2023-04-03 MED ORDER — AZITHROMYCIN 250 MG PO TABS: ORAL_TABLET | ORAL | 0 refills | Status: DC

## 2023-04-03 MED ORDER — ROSUVASTATIN CALCIUM 10 MG PO TABS: 10.0000 mg | ORAL_TABLET | Freq: Every day | ORAL | 3 refills | Status: DC

## 2023-04-03 NOTE — Progress Notes (Signed)
Luis Knapp is a 72 y.o. male with the following history as recorded in EpicCare:  Patient Active Problem List   Diagnosis Date Noted   Chronic gout 06/07/2021   Mixed hyperlipidemia 06/07/2021   Eosinophil count raised 06/07/2021   Prostate cancer (HCC) 11/01/2016   Medicare annual wellness visit, initial 10/18/2015   Hydrocele, right 06/24/2013   Routine general medical examination at a health care facility 12/04/2010   Morbid obesity (HCC) 05/18/2010   SCIATICA 05/18/2010   POLYP, COLON 08/03/2007   Essential hypertension 08/03/2007   Diverticulosis of colon 08/03/2007    Current Outpatient Medications  Medication Sig Dispense Refill   allopurinol (ZYLOPRIM) 300 MG tablet TAKE 1 TABLET BY MOUTH DAILY 90 tablet 1   azithromycin (ZITHROMAX Z-PAK) 250 MG tablet Take 2 tablets (500 mg) PO today, then 1 tablet (250 mg) PO daily x4 days. 6 tablet 0   Cholecalciferol (VITAMIN D-3) 1000 units CAPS Take 1 capsule by mouth daily.     Coenzyme Q10 (COQ-10) 100 MG CAPS Take 200 mg by mouth.     colchicine 0.6 MG tablet TAKE ONE TABLET BY MOUTH DAILY AS NEEDED FOR GOUT OR PSEUDO-GOUT PAIN 30 tablet 2   Multiple Vitamins-Minerals (MULTIVITAMIN ADULT PO) Take 1 tablet by mouth daily.     amLODipine (NORVASC) 5 MG tablet Take 1 tablet (5 mg total) by mouth at bedtime. 90 tablet 3   rosuvastatin (CRESTOR) 10 MG tablet Take 1 tablet (10 mg total) by mouth daily. 90 tablet 3   valsartan-hydrochlorothiazide (DIOVAN-HCT) 320-25 MG tablet Take 1 tablet by mouth daily. 90 tablet 3   No current facility-administered medications for this visit.    Allergies: Patient has no known allergies.  Past Medical History:  Diagnosis Date   Allergic rhinitis due to pollen    Allergy    Diverticulosis of colon (without mention of hemorrhage)    GERD (gastroesophageal reflux disease)    History of colon polyps    2008--  PRE CANEROUS/   2013-- ADENOMA   Prostate cancer (HCC) 11/01/2016   prostate cancer    [name]    Unspecified essential hypertension    Wears glasses     Past Surgical History:  Procedure Laterality Date   COLONOSCOPY     COLONOSCOPY WITH PROPOFOL  10-30-2011  &  2008   POLYPECTOMY   HYDROCELE EXCISION Right 07/18/2013   Procedure: RIGHT HYDROCELECTOMY ADULT;  Surgeon: Crist Fat, MD;  [facility]  Service: Urology;  Laterality: Right;   LYMPH NODE DISSECTION Bilateral 11/01/2016   Procedure: BILATERAL PELVIC LYMPH NODE DISSECTION;  Surgeon: Crist Fat, MD;  Location: [facility];  Service: Urology;  Laterality: Bilateral;   POLYPECTOMY     ROBOT ASSISTED LAPAROSCOPIC RADICAL PROSTATECTOMY N/A 11/01/2016   Procedure: XI ROBOTIC ASSISTED LAPAROSCOPIC RADICAL PROSTATECTOMY;  Surgeon: Crist Fat, MD;  Location: [facility];  Service: Urology;  Laterality: N/A;    Family History  Problem Relation Age of Onset   Coronary artery disease Father    Heart attack Father        2nd one fatal   Pulmonary embolism Father        ?   Dementia Father    Heart disease Father    Colon cancer Father    Dementia Mother    Hypertension Mother    Macular degeneration Mother    Kidney disease Mother    Diabetes Sister    Non-Hodgkin's lymphoma Sister  Colon cancer Maternal Grandfather    Pancreatic cancer Sister    Prostate cancer Neg Hx    Rectal cancer Neg Hx    Colon polyps Neg Hx    Esophageal cancer Neg Hx     Social History   Tobacco Use   Smoking status: Former    Current packs/day: 0.00    Types: Cigarettes    Quit date: 10/17/1995    Years since quitting: 27.4   Smokeless tobacco: Never  Substance Use Topics   Alcohol use: No    Subjective:   4 month follow up on hypertension; at last OV, Amlodipine was added at night; has continued his Diovan HCT; Denies any chest pain, shortness of breath, blurred vision or headache  Is continuing to follow up with urology every 6 months for monitoring of PSA/ follow up on  prostate cancer;   Objective:  Vitals:   04/03/23 0812  BP: 138/84  Pulse: 90  Resp: 18  SpO2: 96%  Weight: 238 lb 9.6 oz (108.2 kg)  Height: 5\' 6"  (1.676 m)    General: Well developed, well nourished, in no acute distress  Skin : Warm and dry.  Head: Normocephalic and atraumatic  Eyes: Sclera and conjunctiva clear; pupils round and reactive to light; extraocular movements intact  Ears: External normal; canals clear; tympanic membranes normal  Oropharynx: Pink, supple. No suspicious lesions  Neck: Supple without thyromegaly, adenopathy  Lungs: Respirations unlabored; coarse breath sounds in upper lobes;  CVS exam: normal rate and regular rhythm.  Neurologic: Alert and oriented; speech intact; face symmetrical; moves all extremities well; CNII-XII intact without focal deficit   Assessment:  1. Muscle cramps   2. Low vitamin B12 level   3. Hyperlipidemia, unspecified hyperlipidemia type   4. Essential hypertension   5. Acute bronchitis, unspecified organism     Plan:   Discussed need to hydrate; check CMP, magnesium; follow up to be determined; Check B12 level; Check lipid panel; refill updated; Stable; refill updated; Rx for Z-pak #1 take as directed; increase fluids, rest and follow up worse, no better.   No follow-ups on file.  Orders Placed This Encounter  Procedures   Comp Met (CMET)   Magnesium   B12   Lipid panel    Requested Prescriptions   Signed Prescriptions Disp Refills   azithromycin (ZITHROMAX Z-PAK) 250 MG tablet 6 tablet 0    Sig: Take 2 tablets (500 mg) PO today, then 1 tablet (250 mg) PO daily x4 days.   amLODipine (NORVASC) 5 MG tablet 90 tablet 3    Sig: Take 1 tablet (5 mg total) by mouth at bedtime.   rosuvastatin (CRESTOR) 10 MG tablet 90 tablet 3    Sig: Take 1 tablet (10 mg total) by mouth daily.   valsartan-hydrochlorothiazide (DIOVAN-HCT) 320-25 MG tablet 90 tablet 3    Sig: Take 1 tablet by mouth daily.

## 2023-04-18 ENCOUNTER — Encounter: Payer: Self-pay | Admitting: Family

## 2023-04-23 ENCOUNTER — Encounter: Payer: Self-pay | Admitting: Family Medicine

## 2023-04-23 ENCOUNTER — Encounter: Payer: Self-pay | Admitting: Family

## 2023-04-23 NOTE — Telephone Encounter (Signed)
Pt had bilateral Zilretta inj 03/15/23. Forwarding to Dr. Denyse Amass to review and advise.

## 2023-04-24 ENCOUNTER — Telehealth: Payer: Self-pay

## 2023-04-24 NOTE — Telephone Encounter (Signed)
VOB initiated for BILAT knee OA.

## 2023-04-24 NOTE — Telephone Encounter (Signed)
Luis Knapp  P [facility] (supporting Rodolph Bong, MD)6 hours ago (7:14 AM)    I'm willing to give the gel injections a try.  Go ahead and get them ready.    Rodolph Bong, MD  Marlene Lard hours ago (7:05 AM)    The left knee has a fair amount of arthritis on MRI.  That is why it is hurting.  The right knee we have a most recent x-ray from 3 years ago that did not show a lot of arthritis.  I suspect you do have more arthritis than we realize.  The only thing we have left to do before a knee replacement is gel injections which may help.  However I fear that you are heading towards knee replacement before too long.  Would you like me to try to get gel injections ready?    You  Rodolph Bong, MDYesterday (7:31 AM)    Pt had bilateral Zilretta inj 03/15/23. Forwarding to Dr. Denyse Amass to review and advise.       Note   Luis Knapp  P [facility] Clinical (supporting Rodolph Bong, MD)Yesterday (7:21 AM)    I'm having pain in both knees, mainly the left knee.   I was doing fine after the shots but after a few rounds of yard work, I'm hobbling around and it doesn't seem to be easing up.  Is there anything else we can do?

## 2023-04-25 NOTE — Telephone Encounter (Signed)
BUY AND BILL  GELSYN for BILAT knee OA   Primary Insurance: Medicare Co-pay: n/a Co-insurance: 20% Deductible: $240 of $240 met Prior Auth: NOT required  Secondary Insurance: AARP Medicare Adv Plan C Co-pay: n/a Co-insurance: covers Medicare co-insurance Deductible: covers Medicare deductible Prior Auth: NOT required    Knee Injection History 08/22/19 - Kenalog RIGHT 08/15/22 - Kenalog LEFT 10/20/22 - Zilretta LEFT 11/23/22 - Zilretta RIGHT 03/15/23 - Zilretta RIGHT

## 2023-04-25 NOTE — Telephone Encounter (Signed)
Prior Auth NOT required for GELSYN  BUY AND BILL - pa not REQUIRED   SPECIALTY PHARMACY - not COVERED    Primary Medicare       Secondary: AARP Medicare Supplement

## 2023-04-27 ENCOUNTER — Other Ambulatory Visit: Payer: Self-pay

## 2023-04-27 DIAGNOSIS — M170 Bilateral primary osteoarthritis of knee: Secondary | ICD-10-CM

## 2023-04-27 NOTE — Telephone Encounter (Signed)
Supply ordered. Holding until it arrives.

## 2023-04-27 NOTE — Telephone Encounter (Signed)
Scheduled

## 2023-04-30 ENCOUNTER — Other Ambulatory Visit: Payer: Self-pay

## 2023-04-30 ENCOUNTER — Ambulatory Visit (INDEPENDENT_AMBULATORY_CARE_PROVIDER_SITE_OTHER): Payer: Medicare Other | Admitting: Family Medicine

## 2023-04-30 VITALS — BP 138/84 | HR 90 | Ht 66.0 in | Wt 247.0 lb

## 2023-04-30 DIAGNOSIS — G8929 Other chronic pain: Secondary | ICD-10-CM

## 2023-04-30 DIAGNOSIS — M25562 Pain in left knee: Secondary | ICD-10-CM | POA: Diagnosis not present

## 2023-04-30 DIAGNOSIS — M25561 Pain in right knee: Secondary | ICD-10-CM

## 2023-04-30 DIAGNOSIS — M170 Bilateral primary osteoarthritis of knee: Secondary | ICD-10-CM

## 2023-04-30 DIAGNOSIS — Z8546 Personal history of malignant neoplasm of prostate: Secondary | ICD-10-CM | POA: Diagnosis not present

## 2023-04-30 MED ORDER — SODIUM HYALURONATE (VISCOSUP) 16.8 MG/2ML IX SOSY
16.8000 mg | PREFILLED_SYRINGE | Freq: Once | INTRA_ARTICULAR | Status: AC
  Administered 2023-04-30: 16.8 mg via INTRA_ARTICULAR

## 2023-04-30 NOTE — Progress Notes (Signed)
Luis Payor, PhD, LAT, ATC acting as a scribe for Luis Graham, MD.  Luis Knapp is a 72 y.o. male who presents to [facility] at [facility] today for cont'd bilat knee pain. Pt was last seen by Dr. Denyse Amass on 03/15/23 and was given bilat Zilretta injections.   Today, pt reports prior Zilretta injections not lasting very long. He notes his knees were feeling pretty good, but then pain was exacerbated by doing some yard work. L>R.   He has an appointment scheduled with Dr. Magnus Ivan on January 13 to talk about potential total knee replacement for his left knee.  Dx testing: 10/04/22 L knee MRI             09/26/22 Labs 08/15/22 L knee XR & labs 11/17/21 labs 08/19/19 R knee XR   Pertinent review of systems: No fevers or chills  Relevant historical information: Hypertension.  History of prostate cancer.   Exam:  BP 138/84   Pulse 90   Ht 5\' 6"  (1.676 m)   Wt 247 lb (112 kg)   SpO2 96%   BMI 39.87 kg/m  General: Well Developed, well nourished, and in no acute distress.   MSK: Knees bilaterally mild effusion normal motion with crepitation.    Lab and Radiology Results  Luis Knapp presents to clinic today for Gelsyn injection bilateral knee 1/3 Procedure: Real-time Ultrasound Guided Injection of right knee joint superior lateral patellar space Device: Philips Affiniti 50G/GE Logiq Images permanently stored and available for review in PACS Verbal informed consent obtained.  Discussed risks and benefits of procedure. Warned about infection, bleeding, damage to structures among others. Patient expresses understanding and agreement Time-out conducted.   Noted no overlying erythema, induration, or other signs of local infection.   Skin prepped in a sterile fashion.   Local anesthesia: Topical Ethyl chloride.   With sterile technique and under real time ultrasound guidance: Gelsyn 2 mL injected into knee joint. Fluid seen entering the joint capsule.    Completed without difficulty   Advised to call if fevers/chills, erythema, induration, drainage, or persistent bleeding.   Images permanently stored and available for review in the ultrasound unit.  Impression: Technically successful ultrasound guided injection.  Procedure: Real-time Ultrasound Guided Injection of left knee joint superior lateral patellar space Device: Philips Affiniti 50G/GE Logiq Images permanently stored and available for review in PACS Verbal informed consent obtained.  Discussed risks and benefits of procedure. Warned about infection, bleeding, damage to structures among others. Patient expresses understanding and agreement Time-out conducted.   Noted no overlying erythema, induration, or other signs of local infection.   Skin prepped in a sterile fashion.   Local anesthesia: Topical Ethyl chloride.   With sterile technique and under real time ultrasound guidance: Gelsyn 2 mL injected into knee joint. Fluid seen entering the joint capsule.   Completed without difficulty   Advised to call if fevers/chills, erythema, induration, drainage, or persistent bleeding.   Images permanently stored and available for review in the ultrasound unit.  Impression: Technically successful ultrasound guided injection.  Gelsyn Lot number: [id number] both knees  Assessment and Plan: 72 y.o. male with bilateral knee pain due to DJD.  Left knee is worse than right.  This is a chronic ongoing issue with an acute exacerbation.  Unfortunately Zilretta injection did not work very well when last provided on October 31.  Plan for Gelsyn series starting today.  He is scheduled to see Dr. Magnus Ivan orthopedics to talk about knee  replacement on January 16 which is appropriate.  Return following Christmas holiday for [place] series #2 PDMP not reviewed this encounter. Orders Placed This Encounter  Procedures   [place] LIMITED JOINT SPACE STRUCTURES LOW BILAT(NO LINKED CHARGES)    Reason for Exam (SYMPTOM   OR DIAGNOSIS REQUIRED):   bilateral knee pain    Preferred imaging location?:   [facility] ordered this encounter  Medications   sodium hyaluronate (viscosup) (GELSYN-3) intra-articular injection 16.8 mg   sodium hyaluronate (viscosup) (GELSYN-3) intra-articular injection 16.8 mg     Discussed warning signs or symptoms. Please see discharge instructions. Patient expresses understanding.   The above documentation has been reviewed and is accurate and complete Luis Knapp, M.D.

## 2023-04-30 NOTE — Patient Instructions (Addendum)
Thank you for coming in today.   You received an injection today. Seek immediate medical attention if the joint becomes red, extremely painful, or is oozing fluid.   Schedule the 2nd Gelsyn injection for the week after Christmas and the 3rd for the week after New Years

## 2023-05-07 DIAGNOSIS — N393 Stress incontinence (female) (male): Secondary | ICD-10-CM | POA: Diagnosis not present

## 2023-05-07 DIAGNOSIS — N5231 Erectile dysfunction following radical prostatectomy: Secondary | ICD-10-CM | POA: Diagnosis not present

## 2023-05-07 DIAGNOSIS — Z8546 Personal history of malignant neoplasm of prostate: Secondary | ICD-10-CM | POA: Diagnosis not present

## 2023-05-08 ENCOUNTER — Other Ambulatory Visit (HOSPITAL_COMMUNITY): Payer: Self-pay | Admitting: Urology

## 2023-05-08 DIAGNOSIS — R9721 Rising PSA following treatment for malignant neoplasm of prostate: Secondary | ICD-10-CM

## 2023-05-10 NOTE — Telephone Encounter (Signed)
Gelsyn injection scheduled for BILAT knee OA #1 - 04/30/23 #2 - scheduled 05/14/23 #3 - scheduled 05/21/23   Prior Auth: NOT required

## 2023-05-14 ENCOUNTER — Ambulatory Visit (INDEPENDENT_AMBULATORY_CARE_PROVIDER_SITE_OTHER): Payer: Medicare Other | Admitting: Family Medicine

## 2023-05-14 ENCOUNTER — Other Ambulatory Visit: Payer: Self-pay

## 2023-05-14 DIAGNOSIS — M170 Bilateral primary osteoarthritis of knee: Secondary | ICD-10-CM

## 2023-05-14 MED ORDER — SODIUM HYALURONATE (VISCOSUP) 16.8 MG/2ML IX SOSY
16.8000 mg | PREFILLED_SYRINGE | Freq: Once | INTRA_ARTICULAR | Status: AC
  Administered 2023-05-14: 16.8 mg via INTRA_ARTICULAR

## 2023-05-14 NOTE — Telephone Encounter (Signed)
Will re-run benefits on or after 05/16/23.

## 2023-05-14 NOTE — Progress Notes (Signed)
Luis Knapp presents to clinic today for Gelsyn injection bilateral knee 2/3 Procedure: Real-time Ultrasound Guided Injection of right knee joint superior lateral patellar space Device: Philips Affiniti 50G/GE Logiq Images permanently stored and available for review in PACS Verbal informed consent obtained.  Discussed risks and benefits of procedure. Warned about infection, bleeding, damage to structures among others. Patient expresses understanding and agreement Time-out conducted.   Noted no overlying erythema, induration, or other signs of local infection.   Skin prepped in a sterile fashion.   Local anesthesia: Topical Ethyl chloride.   With sterile technique and under real time ultrasound guidance: Gelsyn 2 mL injected into knee joint. Fluid seen entering the joint capsule.   Completed without difficulty   Advised to call if fevers/chills, erythema, induration, drainage, or persistent bleeding.   Images permanently stored and available for review in the ultrasound unit.  Impression: Technically successful ultrasound guided injection.  Procedure: Real-time Ultrasound Guided Injection of left knee joint superior lateral patellar space Device: Philips Affiniti 50G/GE Logiq Images permanently stored and available for review in PACS Verbal informed consent obtained.  Discussed risks and benefits of procedure. Warned about infection, bleeding, damage to structures among others. Patient expresses understanding and agreement Time-out conducted.   Noted no overlying erythema, induration, or other signs of local infection.   Skin prepped in a sterile fashion.   Local anesthesia: Topical Ethyl chloride.   With sterile technique and under real time ultrasound guidance: Gelsyn 2 mL injected into knee joint. Fluid seen entering the joint capsule.   Completed without difficulty   Advised to call if fevers/chills, erythema, induration, drainage, or persistent bleeding.   Images permanently  stored and available for review in the ultrasound unit.  Impression: Technically successful ultrasound guided injection. Lot number: [id number] Return in 1 week for Gelsyn injection bilateral knees 3/3

## 2023-05-14 NOTE — Telephone Encounter (Signed)
Need to re-run benefits for 2025 prior to next injection.

## 2023-05-14 NOTE — Patient Instructions (Signed)
Thank you for coming in today.   You received an injection today. Seek immediate medical attention if the joint becomes red, extremely painful, or is oozing fluid.  

## 2023-05-17 NOTE — Telephone Encounter (Signed)
 VOB initiated for 2025.

## 2023-05-21 ENCOUNTER — Ambulatory Visit (INDEPENDENT_AMBULATORY_CARE_PROVIDER_SITE_OTHER): Payer: Medicare Other | Admitting: Family Medicine

## 2023-05-21 ENCOUNTER — Other Ambulatory Visit: Payer: Self-pay

## 2023-05-21 ENCOUNTER — Encounter (HOSPITAL_COMMUNITY)
Admission: RE | Admit: 2023-05-21 | Discharge: 2023-05-21 | Disposition: A | Discharge status: Home / Self Care | Payer: Medicare Other | Source: Ambulatory Visit | Attending: Urology | Admitting: Urology

## 2023-05-21 DIAGNOSIS — R9721 Rising PSA following treatment for malignant neoplasm of prostate: Secondary | ICD-10-CM | POA: Diagnosis not present

## 2023-05-21 DIAGNOSIS — M170 Bilateral primary osteoarthritis of knee: Secondary | ICD-10-CM

## 2023-05-21 DIAGNOSIS — C61 Malignant neoplasm of prostate: Secondary | ICD-10-CM | POA: Diagnosis not present

## 2023-05-21 MED ORDER — SODIUM HYALURONATE (VISCOSUP) 16.8 MG/2ML IX SOSY
16.8000 mg | PREFILLED_SYRINGE | Freq: Once | INTRA_ARTICULAR | Status: AC
  Administered 2023-05-21: 16.8 mg via INTRA_ARTICULAR

## 2023-05-21 MED ORDER — FLOTUFOLASTAT F 18 GALLIUM 296-5846 MBQ/ML IV SOLN
7.4460 | Freq: Once | INTRAVENOUS | Status: AC
  Administered 2023-05-21: 7.446 via INTRAVENOUS

## 2023-05-21 NOTE — Patient Instructions (Signed)
 Thank you for coming in today.   You received an injection today. Seek immediate medical attention if the joint becomes red, extremely painful, or is oozing fluid.

## 2023-05-21 NOTE — Progress Notes (Signed)
 Luis Knapp presents to clinic today for Gelsyn injection bilateral knee 3/3 Procedure: Real-time Ultrasound Guided Injection of right knee joint superior lateral patellar space Device: Philips Affiniti 50G/GE Logiq Images permanently stored and available for review in PACS Verbal informed consent obtained.  Discussed risks and benefits of procedure. Warned about infection, bleeding, damage to structures among others. Patient expresses understanding and agreement Time-out conducted.   Noted no overlying erythema, induration, or other signs of local infection.   Skin prepped in a sterile fashion.   Local anesthesia: Topical Ethyl chloride.   With sterile technique and under real time ultrasound guidance: Gelsyn 2 mL injected into knee joint. Fluid seen entering the joint capsule.   Completed without difficulty   Advised to call if fevers/chills, erythema, induration, drainage, or persistent bleeding.   Images permanently stored and available for review in the ultrasound unit.  Impression: Technically successful ultrasound guided injection.  Procedure: Real-time Ultrasound Guided Injection of left knee joint superior lateral patellar space Device: Philips Affiniti 50G/GE Logiq Images permanently stored and available for review in PACS Verbal informed consent obtained.  Discussed risks and benefits of procedure. Warned about infection, bleeding, damage to structures among others. Patient expresses understanding and agreement Time-out conducted.   Noted no overlying erythema, induration, or other signs of local infection.   Skin prepped in a sterile fashion.   Local anesthesia: Topical Ethyl chloride.   With sterile technique and under real time ultrasound guidance: Gelsyn 2 mL injected into knee joint. Fluid seen entering the joint capsule.   Completed without difficulty   Advised to call if fevers/chills, erythema, induration, drainage, or persistent bleeding.   Images permanently  stored and available for review in the ultrasound unit.  Impression: Technically successful ultrasound guided injection. Lot number: [id number] Patient is seeing Dr. Vernetta next week for surgical consultation.  He is feeling a bit better but not fully better.

## 2023-05-23 NOTE — Telephone Encounter (Signed)
 Medical Buy and Bill  Prior Authorization NOT required   Primary: Medicare     Secondary: AARP Medicare Supplement plan C

## 2023-05-23 NOTE — Telephone Encounter (Signed)
 Medical Buy and Bill  GELSYN for BILAT knee OA  Primary Insurance: Medicare Co-Pay: n/a Co-Insurance: 20% Deductible: $0 of $257 met  Prior Authorization: NOT required  Secondary Insurance: AARP Medicare Supplement Plan C Co-Pay: n/a Co-Insurance: Covers Medicare Part B co-insurance Deductible:  Covered by secondary  Prior Authorization: NOT required   Knee Injection History 08/22/19 - Kenalog  RIGHT 08/15/22 - Kenalog  LEFT 10/20/22 - Zilretta  LEFT 11/23/22 - Zilretta  RIGHT 03/15/23 - Zilretta  BILAT 04/30/23 - Gelsyn #1 BILAT 05/14/23 - Gelsyn #2 BILAT 05/21/23 - Gelsyn #3 BILAT  Can consider repeat injections on or after 11/19/23

## 2023-05-23 NOTE — Telephone Encounter (Signed)
 Medical Buy and [employer] - Prior Authorization NOT required  [employer] - Prior Authorization NOT required   Primary: Medicare      Secondary: AARP Medicare Supplement

## 2023-05-23 NOTE — Addendum Note (Signed)
 Addended by: Dierdre Searles on: 05/23/2023 04:21 PM   Modules accepted: Orders

## 2023-05-28 ENCOUNTER — Ambulatory Visit (INDEPENDENT_AMBULATORY_CARE_PROVIDER_SITE_OTHER): Payer: Medicare Other | Admitting: Orthopaedic Surgery

## 2023-05-28 VITALS — Ht 66.0 in | Wt 247.0 lb

## 2023-05-28 DIAGNOSIS — M25562 Pain in left knee: Secondary | ICD-10-CM

## 2023-05-28 DIAGNOSIS — G8929 Other chronic pain: Secondary | ICD-10-CM | POA: Diagnosis not present

## 2023-05-28 DIAGNOSIS — M25561 Pain in right knee: Secondary | ICD-10-CM | POA: Diagnosis not present

## 2023-05-28 DIAGNOSIS — M1712 Unilateral primary osteoarthritis, left knee: Secondary | ICD-10-CM | POA: Insufficient documentation

## 2023-05-28 NOTE — Progress Notes (Signed)
 The patient is an active 73 year old gentleman sent over from Dr. Artist Lloyd to evaluate and treat chronic knee pain and known osteoarthritis.  He actually a week ago and has completed injection #3 of a series of 3 hyaluronic acid injections in both knees.  His left knee is what bothers him the most and is gotten significantly worse over the last 2 to 3 years.  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  He said the last time he did a lot of work it did put more pressure on his left knee and has had more pain since then.  He actually has plain films in the canopy system and a MRI of his left knee.  I was able to review all of his past medical history and medications within epic.  He has had robotic prostatectomy back in 2018 and is recently had a rising PSA.  He had a PET scan that the results showed something according the patient that he is following up soon with Dr. Cam from [facility] urology.  The patient has had steroid injections in both knees in the past as well as recently hyaluronic acid injections as a series of 3 just finished again last week by Dr. Lloyd.  He points the medial aspect of his knee as the main source of his pain.  It is detrimentally that he is mobility, his quality of life and his actives daily living.  He is to tolerate daily walks with his wife but she is having problems doing now.  Examination of his left knee does show varus malalignment that is correctable.  There is significant medial joint line tenderness and mild effusion.  There is patellofemoral crepitation good range of motion of his left knee and it is ligamentously stable.  The MRI does show areas of full-thickness cartilage loss of the weightbearing surface of the left knee medially and patellofemoral.  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  I did go over knee replacement surgery and showed him a knee replacement model.  We talked about what to  expect from an intraoperative and postoperative standpoint and discussed the risk and benefits of this type of surgery.  He has my surgery scheduler's card and he is going to reach out to Dr. Cam and go from there in terms of what further treatments he may need and the timing of this prior to considering knee replacement surgery.  He said he would reach back out to us  as well.  All questions and concerns were addressed and answered.

## 2023-06-06 ENCOUNTER — Encounter: Payer: Self-pay | Admitting: Radiation Oncology

## 2023-06-06 NOTE — Progress Notes (Signed)
GU Location of Tumor / Histology: Prostate Ca (biochemical recurrence)  Prostatectomy (2018)  Patient underwent bilateral nerve sparing approach.  Gleason score was 3 (90%0 + 4 (10%)= 7.  Pretreatment PSA:  4.58.  PSA 2.45 on 04/30/2023 PSA 1.81 on 10/24/2022    Luis Knapp was referral from Dr. Jasmine Awe office for discussion of oligometastatic radiation.  05/21/2023 Dr. Berniece Salines NM PET (PSMA) Skull to Mid Thigh CLINICAL DATA: Prostate carcinoma with biochemical recurrence.   IMPRESSION: 1. Two tiny foci of radiotracer activity in the pelvis are concerning for prostate cancer metastasis. 2. No evidence of local prostate cancer recurrence in the prostate bed. 3. No evidence of visceral metastasis or skeletal metastasis.   Past/Anticipated interventions by urology, if any:     Past/Anticipated interventions by medical oncology, if any: NA  Weight changes, if any:  No  IPSS:  4 SHIM: 22  Bowel/Bladder complaints, if any:  No  Nausea/Vomiting, if any: No  Pain issues, if any:  5/10 bilateral knees  SAFETY ISSUES: Prior radiation? Yes, 12/24/17 - 02/14/18: The prostatic fossa was treated to 68.4 Gy in 38 fractions of 1.8 Gy. Pacemaker/ICD? No Possible current pregnancy? Male Is the patient on methotrexate? No  Current Complaints / other details:

## 2023-06-08 ENCOUNTER — Other Ambulatory Visit: Payer: Self-pay

## 2023-06-10 NOTE — Progress Notes (Signed)
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 Prostate carcinoma, recurrent (HCC)  C61     2. Malignant neoplasm of prostate metastatic to intrapelvic lymph node [facility])  C61    C77.5       HISTORY OF PRESENT ILLNESS: Luis Knapp is a 73 y.o. male seen at the request of Dr. Marlou Porch for oligo-recurrent prostate cancer.  In summary, he was initially diagnosed with Gleason 3+4 prostate cancer in 08/2016. He opted to proceed with prostatectomy on 11/01/16 under Dr. Marlou Porch, and final surgical pathology confirmed pT2 N0, Gleason 3+4 prostate cancer with a focal right posterior margin but no lymph node or seminal vesicle involvement. His initial postoperative PSA was undetectable, but subsequent levels showed a rise, reaching 0.047 by 11/2017. He was referred to [place] on 12/10/17 to discuss potential salvage radiation treatment and went on to complete a 7.5 week course of daily external beam radiation to the prostatic fossa only in November 2019.  Unfortunately, his PSA continued to rise despite the salvage radiation, going from 0.2 in 06/2019 to 2.45 most recently in 04/2023. His prior surveillance PSMA PET scans in 10/2020 and 10/2021 were both negative for any evidence of local recurrence or metastatic disease. However, he recently underwent repeat PSMA PET scan on 05/21/23 showing two tiny foci of radiotracer activity in pelvic lymph nodes, concerning for prostate cancer metastasis but no evidence of local recurrence in the prostate bed, visceral metastasis, or skeletal  metastasis.  He has reviewed the lab and imaging results with his urologist and has been kindly referred back to [place] today to discuss the role for metastasis directed radiotherapy in the setting of oligo metastases.  PREVIOUS RADIATION THERAPY: Yes  12/24/17 - 02/14/18: The prostatic fossa only was treated to 68.4 Gy in 38 fractions of 1.8 Gy   PAST MEDICAL HISTORY:  Past Medical History:  Diagnosis Date   Allergic rhinitis due to pollen    Allergy    Constipation 2018   Diverticulosis of colon (without mention of hemorrhage)    ED (erectile dysfunction)    Elevated PSA    GERD (gastroesophageal reflux disease)    History of colon polyps    2008--  PRE CANEROUS/   2013-- ADENOMA   Low back pain 2018   Muscle spasm 2018   Muscle weakness 2018   Post-void dribbling 2018   Prostate cancer (HCC) 11/01/2016   prostate cancer   [name]    Stress incontinence 2018   Unspecified essential hypertension    Urinary retention 2018   Wears glasses       PAST SURGICAL HISTORY: Past Surgical History:  Procedure Laterality Date   COLONOSCOPY     COLONOSCOPY WITH PROPOFOL  10-30-2011  &  2008   POLYPECTOMY   HYDROCELE EXCISION Right 07/18/2013   Procedure: RIGHT HYDROCELECTOMY ADULT;  Surgeon: Crist Fat, MD;  Location: [facility];  Service: Urology;  Laterality: Right;   LYMPH NODE DISSECTION Bilateral 11/01/2016   Procedure: BILATERAL PELVIC LYMPH NODE DISSECTION;  Surgeon: Crist Fat,  MD;  Location: [facility];  Service: Urology;  Laterality: Bilateral;   POLYPECTOMY     PROSTATE BIOPSY     ROBOT ASSISTED LAPAROSCOPIC RADICAL PROSTATECTOMY N/A 11/01/2016   Procedure: XI ROBOTIC ASSISTED LAPAROSCOPIC RADICAL PROSTATECTOMY;  Surgeon: Crist Fat, MD;  Location: [facility];  Service: Urology;  Laterality: N/A;    FAMILY HISTORY:  Family History  Problem Relation Age of Onset   Coronary artery disease Father    Heart attack Father        2nd  one fatal   Pulmonary embolism Father        ?   Dementia Father    Heart disease Father    Colon cancer Father    Dementia Mother    Hypertension Mother    Macular degeneration Mother    Kidney disease Mother    Diabetes Sister    Non-Hodgkin's lymphoma Sister    Colon cancer Maternal Grandfather    Pancreatic cancer Sister    Prostate cancer Neg Hx    Rectal cancer Neg Hx    Colon polyps Neg Hx    Esophageal cancer Neg Hx     SOCIAL HISTORY:  Social History   Socioeconomic History   Marital status: Married    Spouse name: Not on file   Number of children: 2   Years of education: Not on file   Highest education level: 12th grade  Occupational History   Occupation: Retired - Probation officer: [employer]    Comment: [employer], trained as   Occupation: Catering manager firm    Comment: Printmaker  Tobacco Use   Smoking status: Former    Current packs/day: 0.00    Types: Cigarettes    Quit date: 10/17/1995    Years since quitting: 27.6   Smokeless tobacco: Never  Vaping Use   Vaping status: Never Used  Substance and Sexual Activity   Alcohol use: No   Drug use: No   Sexual activity: Yes    Comment: with aid of SIldenafil and VED  Other Topics Concern   Not on file  Social History Narrative   Married '77- 19 years divorced; married '02. 2 sons - '79, '80; 1 grandson. Things are ok. Work is OK - Quarry manager.    Fun/Hobby: Works at [employer] on a regular basis   Former Smoker, quit in 1997   Social Drivers of [employer]: Low Risk  (03/27/2023)   Overall Financial Resource Strain (CARDIA)    Difficulty of Paying Living Expenses: Not hard at all  Food Insecurity: No Food Insecurity (06/11/2023)   Hunger Vital Sign    Worried About Running Out of Food in the Last Year: Never true    Ran Out of Food in the Last Year: Never true  Transportation Needs: No Transportation Needs (06/11/2023)   PRAPARE -  Administrator, Civil Service (Medical): No    Lack of Transportation (Non-Medical): No  Physical Activity: Sufficiently Active (03/27/2023)   Exercise Vital Sign    Days of Exercise per Week: 5 days    Minutes of Exercise per Session: 30 min  Stress: No Stress Concern Present (03/27/2023)   Luis Knapp of Occupational Health - Occupational Stress Questionnaire    Feeling of Stress : Not at all  Social Connections: Moderately Isolated (03/27/2023)   Social Connection and Isolation Panel [NHANES]    Frequency of Communication with Friends and Family: Never  Frequency of Social Gatherings with Friends and Family: Twice a week    Attends Religious Services: Never    Database administrator or Organizations: No    Attends Engineer, structural: More than 4 times per year    Marital Status: Married  Catering manager Violence: Not At Risk (06/11/2023)   Humiliation, Afraid, Rape, and Kick questionnaire    Fear of Current or Ex-Partner: No    Emotionally Abused: No    Physically Abused: No    Sexually Abused: No    ALLERGIES: Patient has no known allergies.  MEDICATIONS:  Current Outpatient Medications  Medication Sig Dispense Refill   allopurinol (ZYLOPRIM) 300 MG tablet TAKE 1 TABLET BY MOUTH DAILY 90 tablet 1   amLODipine (NORVASC) 5 MG tablet Take 1 tablet (5 mg total) by mouth at bedtime. 90 tablet 3   Cholecalciferol (VITAMIN D-3) 1000 units CAPS Take 1 capsule by mouth daily.     Coenzyme Q10 (COQ-10) 100 MG CAPS Take 200 mg by mouth.     rosuvastatin (CRESTOR) 10 MG tablet Take 1 tablet (10 mg total) by mouth daily. 90 tablet 3   Turmeric 500 MG TABS Take 500 mg by mouth daily.     valsartan-hydrochlorothiazide (DIOVAN-HCT) 320-25 MG tablet Take 1 tablet by mouth daily. 90 tablet 3   No current facility-administered medications for this encounter.    REVIEW OF SYSTEMS:  On review of systems, the patient reports that he is doing well overall. He  denies any chest pain, shortness of breath, cough, fevers, chills, night sweats, or unintended weight changes. He denies any bowel or bladder disturbances, and denies abdominal pain, nausea or vomiting. He denies any new musculoskeletal or joint aches or pains.  He has chronic knee pain and is planning an upcoming total knee arthroplasty in the next few months.  A complete review of systems is obtained and is otherwise negative.    PHYSICAL EXAM:  Wt Readings from Last 3 Encounters:  06/11/23 240 lb 12.8 oz (109.2 kg)  05/28/23 247 lb (112 kg)  04/30/23 247 lb (112 kg)   Temp Readings from Last 3 Encounters:  06/11/23 (!) 97.5 F (36.4 C)  07/18/22 (!) 97.3 F (36.3 C) (Temporal)  03/02/22 98 F (36.7 C) (Oral)   BP Readings from Last 3 Encounters:  06/11/23 (!) 148/71  04/30/23 138/84  04/03/23 138/84   Pulse Readings from Last 3 Encounters:  06/11/23 90  04/30/23 90  04/03/23 90   Pain Assessment Pain Score: 5  Pain Loc: Knee (bilateral)/10  In general this is a well appearing Caucasian man in no acute distress. He's alert and oriented x4 and appropriate throughout the examination. Cardiopulmonary assessment is negative for acute distress and he exhibits normal effort.     KPS = 100  100 - Normal; no complaints; no evidence of disease. 90   - Able to carry on normal activity; minor signs or symptoms of disease. 80   - Normal activity with effort; some signs or symptoms of disease. 39   - Cares for self; unable to carry on normal activity or to do active work. 60   - Requires occasional assistance, but is able to care for most of his personal needs. 50   - Requires considerable assistance and frequent medical care. 40   - Disabled; requires special care and assistance. 30   - Severely disabled; hospital admission is indicated although death not imminent. 20   - Very sick; hospital admission  necessary; active supportive treatment necessary. 10   - Moribund; fatal  processes progressing rapidly. 0     - Dead  Karnofsky DA, Abelmann WH, Craver LS and Burchenal [facility] [phone]) The use of the nitrogen mustards in the palliative treatment of carcinoma: with particular reference to bronchogenic carcinoma Cancer 1 634-56  LABORATORY DATA:  Lab Results  Component Value Date   WBC 7.6 04/03/2023   HGB 14.5 04/03/2023   HCT 43.5 04/03/2023   MCV 91.1 04/03/2023   PLT 256.0 04/03/2023   Lab Results  Component Value Date   NA 139 04/03/2023   K 3.6 04/03/2023   CL 97 04/03/2023   CO2 31 04/03/2023   Lab Results  Component Value Date   ALT 27 04/03/2023   AST 23 04/03/2023   ALKPHOS 77 04/03/2023   BILITOT 0.6 04/03/2023     RADIOGRAPHY: NM PET (PSMA) SKULL TO MID THIGH Result Date: 05/26/2023 CLINICAL DATA:  Prostate carcinoma with biochemical recurrence. EXAM: NUCLEAR MEDICINE PET SKULL BASE TO THIGH TECHNIQUE: 7.5 mCi Flotufolastat (Posluma) was injected intravenously. Full-ring PET imaging was performed from the skull base to thigh after the radiotracer. CT data was obtained and used for attenuation correction and anatomic localization. COMPARISON:  None Available. FINDINGS: NECK No radiotracer activity in neck lymph nodes. Incidental CT finding: None. CHEST No radiotracer accumulation within mediastinal or hilar lymph nodes. No suspicious pulmonary nodules on the CT scan. Incidental CT finding: None. ABDOMEN/PELVIS Prostate: No focal activity in the prostate bed. Lymph nodes: There is a focus of radiotracer activity in the presacral space just RIGHT of midline with SUV max equal 4.9 (image 164). There is a subtle soft tissue density measuring approximately 4 mm at this site (image 165/series 4) The second tiny focus radiotracer activity along the LEFT internal iliac artery in the deep pelvis on image 180 with SUV max equal 3.4. No clear CT correlation. Liver: No evidence of liver metastasis. Incidental CT finding: None. SKELETON No focal activity to suggest  skeletal metastasis. IMPRESSION: 1. Two tiny foci of radiotracer activity in the pelvis are concerning for prostate cancer metastasis. 2. No evidence of local prostate cancer recurrence in the prostate bed. 3. No evidence of visceral metastasis or skeletal metastasis. Electronically Signed   By: Genevive Bi M.D.   On: 05/26/2023 17:12   [place] LIMITED JOINT SPACE STRUCTURES LOW BILAT(NO LINKED CHARGES) Result Date: 05/22/2023 Heidi Lemay presents to clinic today for Gelsyn injection bilateral knee 3/3 Procedure: Real-time Ultrasound Guided Injection of right knee joint superior lateral patellar space Device: Philips Affiniti 50G/GE Logiq Images permanently stored and available for review in PACS Verbal informed consent obtained.  Discussed risks and benefits of procedure. Warned about infection, bleeding, damage to structures among others. Patient expresses understanding and agreement Time-out conducted.  Noted no overlying erythema, induration, or other signs of local infection.  Skin prepped in a sterile fashion.  Local anesthesia: Topical Ethyl chloride.  With sterile technique and under real time ultrasound guidance: Gelsyn 2 mL injected into knee joint. Fluid seen entering the joint capsule.  Completed without difficulty  Advised to call if fevers/chills, erythema, induration, drainage, or persistent bleeding.  Images permanently stored and available for review in the ultrasound unit. Impression: Technically successful ultrasound guided injection.  Procedure: Real-time Ultrasound Guided Injection of left knee joint superior lateral patellar space Device: Philips Affiniti 50G/GE Logiq Images permanently stored and available for review in PACS Verbal informed consent obtained.  Discussed risks and benefits of procedure. Warned  about infection, bleeding, damage to structures among others. Patient expresses understanding and agreement Time-out conducted.  Noted no overlying erythema, induration, or other  signs of local infection.  Skin prepped in a sterile fashion.  Local anesthesia: Topical Ethyl chloride.  With sterile technique and under real time ultrasound guidance: Gelsyn 2 mL injected into knee joint. Fluid seen entering the joint capsule.  Completed without difficulty  Advised to call if fevers/chills, erythema, induration, drainage, or persistent bleeding.  Images permanently stored and available for review in the ultrasound unit. Impression: Technically successful ultrasound guided injection. Lot number: [id number] Patient is seeing Dr. Magnus Ivan next week for surgical consultation.  He is feeling a bit better but not fully better.   [place] LIMITED JOINT SPACE STRUCTURES LOW BILAT(NO LINKED CHARGES) Result Date: 05/22/2023 Tramane Gorum presents to clinic today for Gelsyn injection bilateral knee 2/3 Procedure: Real-time Ultrasound Guided Injection of right knee joint superior lateral patellar space Device: Philips Affiniti 50G/GE Logiq Images permanently stored and available for review in PACS Verbal informed consent obtained.  Discussed risks and benefits of procedure. Warned about infection, bleeding, damage to structures among others. Patient expresses understanding and agreement Time-out conducted.  Noted no overlying erythema, induration, or other signs of local infection.  Skin prepped in a sterile fashion.  Local anesthesia: Topical Ethyl chloride.  With sterile technique and under real time ultrasound guidance: Gelsyn 2 mL injected into knee joint. Fluid seen entering the joint capsule.  Completed without difficulty  Advised to call if fevers/chills, erythema, induration, drainage, or persistent bleeding.  Images permanently stored and available for review in the ultrasound unit. Impression: Technically successful ultrasound guided injection.  Procedure: Real-time Ultrasound Guided Injection of left knee joint superior lateral patellar space Device: Philips Affiniti 50G/GE Logiq Images permanently  stored and available for review in PACS Verbal informed consent obtained.  Discussed risks and benefits of procedure. Warned about infection, bleeding, damage to structures among others. Patient expresses understanding and agreement Time-out conducted.  Noted no overlying erythema, induration, or other signs of local infection.  Skin prepped in a sterile fashion.  Local anesthesia: Topical Ethyl chloride.  With sterile technique and under real time ultrasound guidance: Gelsyn 2 mL injected into knee joint. Fluid seen entering the joint capsule.  Completed without difficulty  Advised to call if fevers/chills, erythema, induration, drainage, or persistent bleeding.  Images permanently stored and available for review in the ultrasound unit. Impression: Technically successful ultrasound guided injection. Lot number: [id number] Return in 1 week for Gelsyn injection bilateral knees 3/3      IMPRESSION/PLAN: 1. 73 y.o. 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  Today, we talked to the patient and his wife about the findings and workup thus far. We discussed the natural history of prostate cancer and general treatment, highlighting the role of radiotherapy in the management of oligometastatic disease. We discussed the available radiation techniques, and focused on the details and logistics of delivery.  The recommendation is for a 10 fraction course of daily ultra hypofractionated radiotherapy (UHRT) to the two PET positive pelvic lymph nodes and nearby nodal echelons. We reviewed the anticipated acute and late sequelae associated with radiation in this setting. The patient was encouraged to ask questions that were answered to his satisfaction.  He did specifically inquire about timing of scheduling a total knee arthroplasty and whether this would potentially put him at  a higher risk for postoperative  infection.  Fortunately, we will be treating up in the pelvis, well away from the knee and therefore, will not place him at any increased risk for immunocompromise in the knee region but did recommend that he wait approximately 1 month after completing the radiation, to allow recovery from the effects of the pelvic radiation.  At the end of our conversation, the patient is in agreement to proceed with the recommended 10 fraction course of daily ultra hypofractionated radiotherapy (UHRT) to the two PET positive pelvic lymph nodes and nearby nodal echelons.  He has freely signed written consent to proceed today in the office and a copy of this document will be placed in his medical record.  We will share our discussion with Dr. Marlou Porch and proceed with treatment planning accordingly.  He is tentatively scheduled for CT simulation/treatment planning at 9 AM on Wednesday, 06/13/2023, in anticipation of beginning his daily treatments in the near future.  We enjoyed meeting with him and his wife again today and look forward to continuing to participate in his care.  We personally spent 60 minutes in this encounter including chart review, reviewing radiological studies, meeting face-to-face with the patient, entering orders and completing documentation.    Marguarite Arbour, PA-C    Margaretmary Dys, MD  [facility]  Radiation Oncology Direct Dial: [phone]  Fax: [phone] [email or web address]  Skype  LinkedIn   This document serves as a record of services personally performed by Margaretmary Dys, MD and Marcello Fennel, PA-C. It was created on their behalf by Mickie Bail, a trained medical scribe. The creation of this record is based on the scribe's personal observations and the provider's statements to them. This document has been checked and approved by the attending provider.

## 2023-06-11 ENCOUNTER — Encounter: Payer: Self-pay | Admitting: Radiation Oncology

## 2023-06-11 ENCOUNTER — Ambulatory Visit: Payer: Medicare Other | Admitting: Radiation Oncology

## 2023-06-11 ENCOUNTER — Ambulatory Visit
Admission: RE | Admit: 2023-06-11 | Discharge: 2023-06-11 | Disposition: A | Discharge status: Home / Self Care | Payer: Medicare Other | Source: Ambulatory Visit | Attending: Radiation Oncology | Admitting: Radiation Oncology

## 2023-06-11 ENCOUNTER — Ambulatory Visit: Payer: Medicare Other

## 2023-06-11 VITALS — BP 148/71 | HR 90 | Temp 97.5°F | Resp 20 | Ht 66.0 in | Wt 240.8 lb

## 2023-06-11 DIAGNOSIS — Z800 Family history of malignant neoplasm of digestive organs: Secondary | ICD-10-CM | POA: Diagnosis not present

## 2023-06-11 DIAGNOSIS — C775 Secondary and unspecified malignant neoplasm of intrapelvic lymph nodes: Secondary | ICD-10-CM | POA: Diagnosis not present

## 2023-06-11 DIAGNOSIS — Z923 Personal history of irradiation: Secondary | ICD-10-CM | POA: Diagnosis not present

## 2023-06-11 DIAGNOSIS — Z79899 Other long term (current) drug therapy: Secondary | ICD-10-CM | POA: Insufficient documentation

## 2023-06-11 DIAGNOSIS — R339 Retention of urine, unspecified: Secondary | ICD-10-CM | POA: Insufficient documentation

## 2023-06-11 DIAGNOSIS — I10 Essential (primary) hypertension: Secondary | ICD-10-CM | POA: Diagnosis not present

## 2023-06-11 DIAGNOSIS — Z860101 Personal history of adenomatous and serrated colon polyps: Secondary | ICD-10-CM | POA: Diagnosis not present

## 2023-06-11 DIAGNOSIS — K219 Gastro-esophageal reflux disease without esophagitis: Secondary | ICD-10-CM | POA: Diagnosis not present

## 2023-06-11 DIAGNOSIS — C61 Malignant neoplasm of prostate: Secondary | ICD-10-CM | POA: Insufficient documentation

## 2023-06-11 DIAGNOSIS — Z191 Hormone sensitive malignancy status: Secondary | ICD-10-CM | POA: Diagnosis not present

## 2023-06-11 HISTORY — DX: Male erectile dysfunction, unspecified: N52.9

## 2023-06-11 HISTORY — DX: Elevated prostate specific antigen (PSA): R97.20

## 2023-06-11 NOTE — Progress Notes (Signed)
Introduced myself to the patient as the prostate nurse navigator.  No barriers to care identified at this time.  He is here to discuss his radiation treatment options.  I gave him my business card and asked him to call me with questions or concerns.  Verbalized understanding.  ?

## 2023-06-12 NOTE — Telephone Encounter (Signed)
Zilretta for BILAT knee OA   [employer]  Primary Insurance: Medicare Co-pay: n/a Co-insurance: 20% Deductible: $0 of $257 met Prior Auth: NOT required  Secondary Insurance: UHC AARP [employer] Supplement Plan C Co-pay: n/a Co-insurance: Covers Medicare Part B co-insurance Deductible:  Covered by secondary Prior Auth: NOT required  Pharmacy benefit - [employer]  Primary Insurance: [employer]  Co-pay: [phone]  Co-insurance: undisclosed Deductible: does not apply Prior Auth: NOT required    Knee Injection History 08/22/19 - Kenalog RIGHT 08/15/22 - Kenalog LEFT 10/20/22 - Zilretta LEFT 11/23/22 - Zilretta RIGHT 03/15/23 - Zilretta BILAT 04/30/23 - Gelsyn #1 BILAT 05/14/23 - Gelsyn #2 BILAT 05/21/23 - Gelsyn #3 BILAT

## 2023-06-13 ENCOUNTER — Ambulatory Visit
Admission: RE | Admit: 2023-06-13 | Discharge: 2023-06-13 | Disposition: A | Discharge status: Home / Self Care | Payer: Medicare Other | Source: Ambulatory Visit | Attending: Radiation Oncology | Admitting: Radiation Oncology

## 2023-06-13 DIAGNOSIS — C61 Malignant neoplasm of prostate: Secondary | ICD-10-CM | POA: Diagnosis not present

## 2023-06-13 DIAGNOSIS — Z191 Hormone sensitive malignancy status: Secondary | ICD-10-CM | POA: Diagnosis not present

## 2023-06-13 NOTE — Progress Notes (Signed)
  Radiation Oncology         [phone]) [phone] ________________________________  Name: Luis Knapp MRN: [id number]  Date: 06/13/2023  DOB: Mar 12, 1951  ULTRAHYPOFRACTIONATED RADIOTHERAPY  SIMULATION AND TREATMENT PLANNING NOTE    ICD-10-CM   1. 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  Identity was confirmed.  All relevant records and images related to the planned course of therapy were reviewed.  The patient freely provided informed written consent to proceed with treatment after reviewing the details related to the planned course of therapy. The consent form was witnessed and verified by the simulation staff.  Then, the patient was set-up in a stable reproducible  supine position for radiation therapy.  A BodyFix immobilization pillow was fabricated for reproducible positioning.  Surface markings were placed.  The CT images were loaded into the planning software.  The gross target volumes (GTV) and planning target volumes (PTV) were delinieated, and avoidance structures were contoured.  Treatment planning then occurred.  The radiation prescription was entered and confirmed.  A total of two complex treatment devices were fabricated in the form of the BodyFix immobilization pillow and a neck accuform cushion.  I have requested : 3D Simulation  I have requested a DVH of the following structures: targets and all normal structures near the target including bowel, bladder, skin and others as noted on the radiation plan to maintain doses in adherence with established limits  SPECIAL TREATMENT PROCEDURE:  The planned course of therapy using radiation constitutes a special treatment procedure. Special care  is required in the management of this patient for the following reasons. High dose per fraction requiring special monitoring for increased toxicities of treatment including daily imaging..  The special nature of the planned course of radiotherapy will require increased physician supervision and oversight to ensure patient's safety with optimal treatment outcomes.    This requires extended time and effort.    PLAN:  The patient will receive 50 Gy in 10 fractions to the PET positive nodes and and 30 Gy to nearby nodal echelons.  ________________________________  Artist Pais Kathrynn Running, M.D.

## 2023-06-13 NOTE — Telephone Encounter (Signed)
Holding until needed. Patient completed Gelsyn series.

## 2023-06-15 DIAGNOSIS — C61 Malignant neoplasm of prostate: Secondary | ICD-10-CM | POA: Diagnosis not present

## 2023-06-15 DIAGNOSIS — Z191 Hormone sensitive malignancy status: Secondary | ICD-10-CM | POA: Diagnosis not present

## 2023-06-20 DIAGNOSIS — Z23 Encounter for immunization: Secondary | ICD-10-CM | POA: Diagnosis not present

## 2023-06-21 ENCOUNTER — Other Ambulatory Visit: Payer: Self-pay

## 2023-06-21 ENCOUNTER — Ambulatory Visit
Admission: RE | Admit: 2023-06-21 | Discharge: 2023-06-21 | Disposition: A | Discharge status: Home / Self Care | Payer: Medicare Other | Source: Ambulatory Visit | Attending: Radiation Oncology | Admitting: Radiation Oncology

## 2023-06-21 DIAGNOSIS — C61 Malignant neoplasm of prostate: Secondary | ICD-10-CM | POA: Insufficient documentation

## 2023-06-21 DIAGNOSIS — Z191 Hormone sensitive malignancy status: Secondary | ICD-10-CM | POA: Diagnosis not present

## 2023-06-21 DIAGNOSIS — Z510 Encounter for antineoplastic radiation therapy: Secondary | ICD-10-CM | POA: Diagnosis not present

## 2023-06-21 LAB — RAD ONC ARIA SESSION SUMMARY
Course Elapsed Days: 0
Plan Fractions Treated to Date: 1
Plan Prescribed Dose Per Fraction: 5 Gy
Plan Total Fractions Prescribed: 10
Plan Total Prescribed Dose: 50 Gy
Reference Point Dosage Given to Date: 5 Gy
Reference Point Session Dosage Given: 5 Gy
Session Number: 1

## 2023-06-22 ENCOUNTER — Telehealth: Payer: Self-pay

## 2023-06-22 ENCOUNTER — Other Ambulatory Visit: Payer: Self-pay

## 2023-06-22 ENCOUNTER — Ambulatory Visit
Admission: RE | Admit: 2023-06-22 | Discharge: 2023-06-22 | Disposition: A | Discharge status: Home / Self Care | Payer: Medicare Other | Source: Ambulatory Visit | Attending: Radiation Oncology | Admitting: Radiation Oncology

## 2023-06-22 DIAGNOSIS — Z191 Hormone sensitive malignancy status: Secondary | ICD-10-CM | POA: Diagnosis not present

## 2023-06-22 DIAGNOSIS — Z510 Encounter for antineoplastic radiation therapy: Secondary | ICD-10-CM | POA: Diagnosis not present

## 2023-06-22 DIAGNOSIS — C61 Malignant neoplasm of prostate: Secondary | ICD-10-CM | POA: Diagnosis not present

## 2023-06-22 LAB — RAD ONC ARIA SESSION SUMMARY
Course Elapsed Days: 1
Plan Fractions Treated to Date: 2
Plan Prescribed Dose Per Fraction: 5 Gy
Plan Total Fractions Prescribed: 10
Plan Total Prescribed Dose: 50 Gy
Reference Point Dosage Given to Date: 10 Gy
Reference Point Session Dosage Given: 5 Gy
Session Number: 2

## 2023-06-22 NOTE — Telephone Encounter (Signed)
 I called patient to return his call (voice mail) about scheduling surgery.  Left voice mail message for return call.

## 2023-06-25 ENCOUNTER — Ambulatory Visit
Admission: RE | Admit: 2023-06-25 | Discharge: 2023-06-25 | Disposition: A | Discharge status: Home / Self Care | Payer: Medicare Other | Source: Ambulatory Visit | Attending: Radiation Oncology

## 2023-06-25 ENCOUNTER — Other Ambulatory Visit: Payer: Self-pay

## 2023-06-25 DIAGNOSIS — Z191 Hormone sensitive malignancy status: Secondary | ICD-10-CM | POA: Diagnosis not present

## 2023-06-25 DIAGNOSIS — C61 Malignant neoplasm of prostate: Secondary | ICD-10-CM | POA: Diagnosis not present

## 2023-06-25 DIAGNOSIS — Z510 Encounter for antineoplastic radiation therapy: Secondary | ICD-10-CM | POA: Diagnosis not present

## 2023-06-25 LAB — RAD ONC ARIA SESSION SUMMARY
Course Elapsed Days: 4
Plan Fractions Treated to Date: 3
Plan Prescribed Dose Per Fraction: 5 Gy
Plan Total Fractions Prescribed: 10
Plan Total Prescribed Dose: 50 Gy
Reference Point Dosage Given to Date: 15 Gy
Reference Point Session Dosage Given: 5 Gy
Session Number: 3

## 2023-06-26 ENCOUNTER — Other Ambulatory Visit: Payer: Self-pay

## 2023-06-26 ENCOUNTER — Ambulatory Visit
Admission: RE | Admit: 2023-06-26 | Discharge: 2023-06-26 | Disposition: A | Discharge status: Home / Self Care | Payer: Medicare Other | Source: Ambulatory Visit | Attending: Radiation Oncology

## 2023-06-26 DIAGNOSIS — Z510 Encounter for antineoplastic radiation therapy: Secondary | ICD-10-CM | POA: Diagnosis not present

## 2023-06-26 DIAGNOSIS — C61 Malignant neoplasm of prostate: Secondary | ICD-10-CM | POA: Diagnosis not present

## 2023-06-26 DIAGNOSIS — Z191 Hormone sensitive malignancy status: Secondary | ICD-10-CM | POA: Diagnosis not present

## 2023-06-26 LAB — RAD ONC ARIA SESSION SUMMARY
Course Elapsed Days: 5
Plan Fractions Treated to Date: 4
Plan Prescribed Dose Per Fraction: 5 Gy
Plan Total Fractions Prescribed: 10
Plan Total Prescribed Dose: 50 Gy
Reference Point Dosage Given to Date: 20 Gy
Reference Point Session Dosage Given: 5 Gy
Session Number: 4

## 2023-06-27 ENCOUNTER — Ambulatory Visit
Admission: RE | Admit: 2023-06-27 | Discharge: 2023-06-27 | Disposition: A | Discharge status: Home / Self Care | Payer: Medicare Other | Source: Ambulatory Visit | Attending: Radiation Oncology

## 2023-06-27 ENCOUNTER — Other Ambulatory Visit: Payer: Self-pay

## 2023-06-27 DIAGNOSIS — Z191 Hormone sensitive malignancy status: Secondary | ICD-10-CM | POA: Diagnosis not present

## 2023-06-27 DIAGNOSIS — C61 Malignant neoplasm of prostate: Secondary | ICD-10-CM | POA: Diagnosis not present

## 2023-06-27 DIAGNOSIS — Z510 Encounter for antineoplastic radiation therapy: Secondary | ICD-10-CM | POA: Diagnosis not present

## 2023-06-27 LAB — RAD ONC ARIA SESSION SUMMARY
Course Elapsed Days: 6
Plan Fractions Treated to Date: 5
Plan Prescribed Dose Per Fraction: 5 Gy
Plan Total Fractions Prescribed: 10
Plan Total Prescribed Dose: 50 Gy
Reference Point Dosage Given to Date: 25 Gy
Reference Point Session Dosage Given: 5 Gy
Session Number: 5

## 2023-06-28 ENCOUNTER — Ambulatory Visit
Admission: RE | Admit: 2023-06-28 | Discharge: 2023-06-28 | Disposition: A | Discharge status: Home / Self Care | Payer: Medicare Other | Source: Ambulatory Visit | Attending: Radiation Oncology | Admitting: Radiation Oncology

## 2023-06-28 ENCOUNTER — Other Ambulatory Visit: Payer: Self-pay

## 2023-06-28 DIAGNOSIS — Z191 Hormone sensitive malignancy status: Secondary | ICD-10-CM | POA: Diagnosis not present

## 2023-06-28 DIAGNOSIS — C61 Malignant neoplasm of prostate: Secondary | ICD-10-CM | POA: Diagnosis not present

## 2023-06-28 DIAGNOSIS — Z510 Encounter for antineoplastic radiation therapy: Secondary | ICD-10-CM | POA: Diagnosis not present

## 2023-06-28 LAB — RAD ONC ARIA SESSION SUMMARY
Course Elapsed Days: 7
Plan Fractions Treated to Date: 6
Plan Prescribed Dose Per Fraction: 5 Gy
Plan Total Fractions Prescribed: 10
Plan Total Prescribed Dose: 50 Gy
Reference Point Dosage Given to Date: 30 Gy
Reference Point Session Dosage Given: 5 Gy
Session Number: 6

## 2023-06-29 ENCOUNTER — Other Ambulatory Visit: Payer: Self-pay

## 2023-06-29 ENCOUNTER — Ambulatory Visit
Admission: RE | Admit: 2023-06-29 | Discharge: 2023-06-29 | Disposition: A | Discharge status: Home / Self Care | Payer: Medicare Other | Source: Ambulatory Visit | Attending: Radiation Oncology | Admitting: Radiation Oncology

## 2023-06-29 DIAGNOSIS — Z510 Encounter for antineoplastic radiation therapy: Secondary | ICD-10-CM | POA: Diagnosis not present

## 2023-06-29 DIAGNOSIS — C61 Malignant neoplasm of prostate: Secondary | ICD-10-CM | POA: Diagnosis not present

## 2023-06-29 DIAGNOSIS — Z191 Hormone sensitive malignancy status: Secondary | ICD-10-CM | POA: Diagnosis not present

## 2023-06-29 LAB — RAD ONC ARIA SESSION SUMMARY
Course Elapsed Days: 8
Plan Fractions Treated to Date: 7
Plan Prescribed Dose Per Fraction: 5 Gy
Plan Total Fractions Prescribed: 10
Plan Total Prescribed Dose: 50 Gy
Reference Point Dosage Given to Date: 35 Gy
Reference Point Session Dosage Given: 5 Gy
Session Number: 7

## 2023-07-02 ENCOUNTER — Ambulatory Visit
Admission: RE | Admit: 2023-07-02 | Discharge: 2023-07-02 | Disposition: A | Discharge status: Home / Self Care | Payer: Medicare Other | Source: Ambulatory Visit | Attending: Radiation Oncology | Admitting: Radiation Oncology

## 2023-07-02 ENCOUNTER — Other Ambulatory Visit: Payer: Self-pay

## 2023-07-02 DIAGNOSIS — Z191 Hormone sensitive malignancy status: Secondary | ICD-10-CM | POA: Diagnosis not present

## 2023-07-02 DIAGNOSIS — Z510 Encounter for antineoplastic radiation therapy: Secondary | ICD-10-CM | POA: Diagnosis not present

## 2023-07-02 DIAGNOSIS — C61 Malignant neoplasm of prostate: Secondary | ICD-10-CM | POA: Diagnosis not present

## 2023-07-02 LAB — RAD ONC ARIA SESSION SUMMARY
Course Elapsed Days: 11
Plan Fractions Treated to Date: 8
Plan Prescribed Dose Per Fraction: 5 Gy
Plan Total Fractions Prescribed: 10
Plan Total Prescribed Dose: 50 Gy
Reference Point Dosage Given to Date: 40 Gy
Reference Point Session Dosage Given: 5 Gy
Session Number: 8

## 2023-07-03 ENCOUNTER — Ambulatory Visit
Admission: RE | Admit: 2023-07-03 | Discharge: 2023-07-03 | Disposition: A | Discharge status: Home / Self Care | Payer: Medicare Other | Source: Ambulatory Visit | Attending: Radiation Oncology

## 2023-07-03 ENCOUNTER — Other Ambulatory Visit: Payer: Self-pay

## 2023-07-03 DIAGNOSIS — Z191 Hormone sensitive malignancy status: Secondary | ICD-10-CM | POA: Diagnosis not present

## 2023-07-03 DIAGNOSIS — Z510 Encounter for antineoplastic radiation therapy: Secondary | ICD-10-CM | POA: Diagnosis not present

## 2023-07-03 DIAGNOSIS — C61 Malignant neoplasm of prostate: Secondary | ICD-10-CM | POA: Diagnosis not present

## 2023-07-03 LAB — RAD ONC ARIA SESSION SUMMARY
Course Elapsed Days: 12
Plan Fractions Treated to Date: 9
Plan Prescribed Dose Per Fraction: 5 Gy
Plan Total Fractions Prescribed: 10
Plan Total Prescribed Dose: 50 Gy
Reference Point Dosage Given to Date: 45 Gy
Reference Point Session Dosage Given: 5 Gy
Session Number: 9

## 2023-07-04 ENCOUNTER — Other Ambulatory Visit: Payer: Self-pay

## 2023-07-04 ENCOUNTER — Ambulatory Visit
Admission: RE | Admit: 2023-07-04 | Discharge: 2023-07-04 | Disposition: A | Discharge status: Home / Self Care | Payer: Medicare Other | Source: Ambulatory Visit | Attending: Radiation Oncology | Admitting: Radiation Oncology

## 2023-07-04 DIAGNOSIS — Z510 Encounter for antineoplastic radiation therapy: Secondary | ICD-10-CM | POA: Diagnosis not present

## 2023-07-04 DIAGNOSIS — C61 Malignant neoplasm of prostate: Secondary | ICD-10-CM | POA: Diagnosis not present

## 2023-07-04 DIAGNOSIS — Z191 Hormone sensitive malignancy status: Secondary | ICD-10-CM | POA: Diagnosis not present

## 2023-07-04 LAB — RAD ONC ARIA SESSION SUMMARY
Course Elapsed Days: 13
Plan Fractions Treated to Date: 10
Plan Prescribed Dose Per Fraction: 5 Gy
Plan Total Fractions Prescribed: 10
Plan Total Prescribed Dose: 50 Gy
Reference Point Dosage Given to Date: 50 Gy
Reference Point Session Dosage Given: 5 Gy
Session Number: 10

## 2023-07-05 NOTE — Radiation Completion Notes (Signed)
 Patient Name: Luis Knapp, Luis Knapp MRN: [id number] Date of Birth: Aug 11, 1950 Referring Physician: Berniece Salines, M.D. Date of Service: 2023-07-05 Radiation Oncologist: Margaretmary Bayley, M.D. [facility] [place]                             RADIATION ONCOLOGY END OF TREATMENT NOTE     Diagnosis: C61 Malignant neoplasm of prostate Intent: Curative     ==========DELIVERED PLANS==========  First Treatment Date: 2023-06-21 Last Treatment Date: 2023-07-04   Plan Name: Pelvis_UHRT Site: Pelvis Technique: IMRT Mode: Photon Dose Per Fraction: 5 Gy Prescribed Dose (Delivered / Prescribed): 50 Gy / 50 Gy Prescribed Fxs (Delivered / Prescribed): 10 / 10     ==========ON TREATMENT VISIT DATES========== 2023-06-22, 2023-06-29     ==========UPCOMING VISITS========== 10/02/2023 [facility] OFFICE VISIT Olive Bass, FNP  08/23/2023 [facility] OP Kathryne Hitch, MD        ==========APPENDIX - ON TREATMENT VISIT NOTES==========   See weekly On Treatment Notes in Epic for details in the Media tab (listed as Progress notes on the On Treatment Visit Dates listed above).

## 2023-07-24 ENCOUNTER — Other Ambulatory Visit: Payer: Self-pay

## 2023-07-29 ENCOUNTER — Encounter: Payer: Self-pay | Admitting: Family

## 2023-08-02 NOTE — Patient Instructions (Signed)
 DUE TO COVID-19 ONLY TWO VISITORS  (aged 74 and older)  ARE ALLOWED TO COME WITH YOU AND STAY IN THE WAITING ROOM ONLY DURING PRE OP AND PROCEDURE.   **NO VISITORS ARE ALLOWED IN THE SHORT STAY AREA OR RECOVERY ROOM!!**  IF YOU WILL BE ADMITTED INTO THE HOSPITAL YOU ARE ALLOWED ONLY FOUR SUPPORT PEOPLE DURING VISITATION HOURS ONLY (7 AM -8PM)   The support person(s) must pass our screening, gel in and out, and wear a mask at all times, including in the patient's room. Patients must also wear a mask when staff or their support person are in the room. Visitors GUEST BADGE MUST BE WORN VISIBLY  One adult visitor may remain with you overnight and MUST be in the room by 8 P.M.     Your procedure is scheduled on: 08/10/23   Report to [facility] Main Entrance    Report to admitting at: 11:30 AM   Call this number if you have problems the morning of surgery [phone]   Do not eat food :After Midnight.   After Midnight you may have the following liquids until : 11:00 AM DAY OF SURGERY  Water Black Coffee (sugar ok, NO MILK/CREAM OR CREAMERS)  Tea (sugar ok, NO MILK/CREAM OR CREAMERS) regular and decaf                             Plain Jell-O (NO RED)                                           Fruit ices (not with fruit pulp, NO RED)                                     Popsicles (NO RED)                                                                  Juice: apple, WHITE grape, WHITE cranberry Sports drinks like Gatorade (NO RED)   The day of surgery:  Drink ONE (1) Pre-Surgery Clear Ensure at : 11:00 AM the morning of surgery. Drink in one sitting. Do not sip.  This drink was given to you during your hospital  pre-op appointment visit. Nothing else to drink after completing the  Pre-Surgery Clear Ensure or G2.          If you have questions, please contact your surgeon's office.  FOLLOW ANY ADDITIONAL PRE OP INSTRUCTIONS YOU RECEIVED FROM YOUR SURGEON'S OFFICE!!!   Oral  Hygiene is also important to reduce your risk of infection.                                    Remember - BRUSH YOUR TEETH THE MORNING OF SURGERY WITH YOUR REGULAR TOOTHPASTE  DENTURES WILL BE REMOVED PRIOR TO SURGERY PLEASE DO NOT APPLY "Poly grip" OR ADHESIVES!!!   Do NOT smoke after Midnight   Take these medicines the morning of surgery with  A SIP OF WATER: allopurinol.                              You may not have any metal on your body including hair pins, jewelry, and body piercing             Do not wear lotions, powders, perfumes/cologne, or deodorant              Men may shave face and neck.   Do not bring valuables to the hospital. [facility] IS NOT             RESPONSIBLE   FOR VALUABLES.   Contacts, glasses, or bridgework may not be worn into surgery.   Bring small overnight bag day of surgery.   DO NOT BRING YOUR HOME MEDICATIONS TO THE HOSPITAL. PHARMACY WILL DISPENSE MEDICATIONS LISTED ON YOUR MEDICATION LIST TO YOU DURING YOUR ADMISSION IN THE HOSPITAL!    Patients discharged on the day of surgery will not be allowed to drive home.  Someone NEEDS to stay with you for the first 24 hours after anesthesia.   Special Instructions: Bring a copy of your healthcare power of attorney and living will documents         the day of surgery if you haven't scanned them before.              Please read over the following fact sheets you were given: IF YOU HAVE QUESTIONS ABOUT YOUR PRE-OP INSTRUCTIONS PLEASE CALL [phone]      Pre-operative 5 CHG Bath Instructions   You can play a key role in reducing the risk of infection after surgery. Your skin needs to be as free of germs as possible. You can reduce the number of germs on your skin by washing with CHG (chlorhexidine gluconate) soap before surgery. CHG is an antiseptic soap that kills germs and continues to kill germs even after washing.   DO NOT use if you have an allergy to chlorhexidine/CHG or antibacterial soaps. If  your skin becomes reddened or irritated, stop using the CHG and notify one of our RNs at [phone].   Please shower with the CHG soap starting 4 days before surgery using the following schedule:     Please keep in mind the following:  DO NOT shave, including legs and underarms, starting the day of your first shower.   You may shave your face at any point before/day of surgery.  Place clean sheets on your bed the day you start using CHG soap. Use a clean washcloth (not used since being washed) for each shower. DO NOT sleep with pets once you start using the CHG.   CHG Shower Instructions:  If you choose to wash your hair and private area, wash first with your normal shampoo/soap.  After you use shampoo/soap, rinse your hair and body thoroughly to remove shampoo/soap residue.  Turn the water OFF and apply about 3 tablespoons (45 ml) of CHG soap to a CLEAN washcloth.  Apply CHG soap ONLY FROM YOUR NECK DOWN TO YOUR TOES (washing for 3-5 minutes)  DO NOT use CHG soap on face, private areas, open wounds, or sores.  Pay special attention to the area where your surgery is being performed.  If you are having back surgery, having someone wash your back for you may be helpful. Wait 2 minutes after CHG soap is applied, then you may rinse off the CHG soap.  Pat dry with a clean towel  Put on clean clothes/pajamas   If you choose to wear lotion, please use ONLY the CHG-compatible lotions on the back of this paper.     Additional instructions for the day of surgery: DO NOT APPLY any lotions, deodorants, cologne, or perfumes.   Put on clean/comfortable clothes.  Brush your teeth.  Ask your nurse before applying any prescription medications to the skin.   CHG Compatible Lotions   Aveeno Moisturizing lotion  Cetaphil Moisturizing Cream  Cetaphil Moisturizing Lotion  Clairol Herbal Essence Moisturizing Lotion, Dry Skin  Clairol Herbal Essence Moisturizing Lotion, Extra Dry Skin  Clairol  Herbal Essence Moisturizing Lotion, Normal Skin  Curel Age Defying Therapeutic Moisturizing Lotion with Alpha Hydroxy  Curel Extreme Care Body Lotion  Curel Soothing Hands Moisturizing Hand Lotion  Curel Therapeutic Moisturizing Cream, Fragrance-Free  Curel Therapeutic Moisturizing Lotion, Fragrance-Free  Curel Therapeutic Moisturizing Lotion, Original Formula  Eucerin Daily Replenishing Lotion  Eucerin Dry Skin Therapy Plus Alpha Hydroxy Crme  Eucerin Dry Skin Therapy Plus Alpha Hydroxy Lotion  Eucerin Original Crme  Eucerin Original Lotion  Eucerin Plus Crme Eucerin Plus Lotion  Eucerin TriLipid Replenishing Lotion  Keri Anti-Bacterial Hand Lotion  Keri Deep Conditioning Original Lotion Dry Skin Formula Softly Scented  Keri Deep Conditioning Original Lotion, Fragrance Free Sensitive Skin Formula  Keri Lotion Fast Absorbing Fragrance Free Sensitive Skin Formula  Keri Lotion Fast Absorbing Softly Scented Dry Skin Formula  Keri Original Lotion  Keri Skin Renewal Lotion Keri Silky Smooth Lotion  Keri Silky Smooth Sensitive Skin Lotion  Nivea Body Creamy Conditioning Oil  Nivea Body Extra Enriched Lotion  Nivea Body Original Lotion  Nivea Body Sheer Moisturizing Lotion Nivea Crme  Nivea Skin Firming Lotion  NutraDerm 30 Skin Lotion  NutraDerm Skin Lotion  NutraDerm Therapeutic Skin Cream  NutraDerm Therapeutic Skin Lotion  ProShield Protective Hand Cream  Provon moisturizing lotion   Incentive Spirometer  An incentive spirometer is a tool that can help keep your lungs clear and active. This tool measures how well you are filling your lungs with each breath. Taking long deep breaths may help reverse or decrease the chance of developing breathing (pulmonary) problems (especially infection) following: A long period of time when you are unable to move or be active. BEFORE THE PROCEDURE  If the spirometer includes an indicator to show your best effort, your nurse or respiratory  therapist will set it to a desired goal. If possible, sit up straight or lean slightly forward. Try not to slouch. Hold the incentive spirometer in an upright position. INSTRUCTIONS FOR USE  Sit on the edge of your bed if possible, or sit up as far as you can in bed or on a chair. Hold the incentive spirometer in an upright position. Breathe out normally. Place the mouthpiece in your mouth and seal your lips tightly around it. Breathe in slowly and as deeply as possible, raising the piston or the ball toward the top of the column. Hold your breath for 3-5 seconds or for as long as possible. Allow the piston or ball to fall to the bottom of the column. Remove the mouthpiece from your mouth and breathe out normally. Rest for a few seconds and repeat Steps 1 through 7 at least 10 times every 1-2 hours when you are awake. Take your time and take a few normal breaths between deep breaths. The spirometer may include an indicator to show your best effort. Use the indicator as a goal to  work toward during each repetition. After each set of 10 deep breaths, practice coughing to be sure your lungs are clear. If you have an incision (the cut made at the time of surgery), support your incision when coughing by placing a pillow or rolled up towels firmly against it. Once you are able to get out of bed, walk around indoors and cough well. You may stop using the incentive spirometer when instructed by your caregiver.  RISKS AND COMPLICATIONS Take your time so you do not get dizzy or light-headed. If you are in pain, you may need to take or ask for pain medication before doing incentive spirometry. It is harder to take a deep breath if you are having pain. AFTER USE Rest and breathe slowly and easily. It can be helpful to keep track of a log of your progress. Your caregiver can provide you with a simple table to help with this. If you are using the spirometer at home, follow these instructions: SEEK MEDICAL  CARE IF:  You are having difficultly using the spirometer. You have trouble using the spirometer as often as instructed. Your pain medication is not giving enough relief while using the spirometer. You develop fever of 100.5 F (38.1 C) or higher. SEEK IMMEDIATE MEDICAL CARE IF:  You cough up bloody sputum that had not been present before. You develop fever of 102 F (38.9 C) or greater. You develop worsening pain at or near the incision site. MAKE SURE YOU:  Understand these instructions. Will watch your condition. Will get help right away if you are not doing well or get worse. Document Released: 09/11/2006 Document Revised: 07/24/2011 Document Reviewed: 11/12/2006 [facility] Patient Information 2014 [place], [place].   ________________________________________________________________________

## 2023-08-03 ENCOUNTER — Encounter (HOSPITAL_COMMUNITY)
Admission: RE | Admit: 2023-08-03 | Discharge: 2023-08-03 | Disposition: A | Discharge status: Home / Self Care | Source: Ambulatory Visit | Attending: Orthopaedic Surgery | Admitting: Orthopaedic Surgery

## 2023-08-03 ENCOUNTER — Other Ambulatory Visit: Payer: Self-pay

## 2023-08-03 ENCOUNTER — Encounter (HOSPITAL_COMMUNITY): Payer: Self-pay

## 2023-08-03 VITALS — BP 137/80 | HR 88 | Temp 98.5°F | Ht 66.0 in | Wt 234.0 lb

## 2023-08-03 DIAGNOSIS — Z01818 Encounter for other preprocedural examination: Secondary | ICD-10-CM | POA: Diagnosis not present

## 2023-08-03 DIAGNOSIS — I10 Essential (primary) hypertension: Secondary | ICD-10-CM | POA: Diagnosis not present

## 2023-08-03 DIAGNOSIS — M1712 Unilateral primary osteoarthritis, left knee: Secondary | ICD-10-CM | POA: Insufficient documentation

## 2023-08-03 DIAGNOSIS — Z01812 Encounter for preprocedural laboratory examination: Secondary | ICD-10-CM | POA: Diagnosis present

## 2023-08-03 DIAGNOSIS — R9431 Abnormal electrocardiogram [ECG] [EKG]: Secondary | ICD-10-CM | POA: Diagnosis not present

## 2023-08-03 DIAGNOSIS — Z01810 Encounter for preprocedural cardiovascular examination: Secondary | ICD-10-CM | POA: Diagnosis present

## 2023-08-03 HISTORY — DX: Unspecified osteoarthritis, unspecified site: M19.90

## 2023-08-03 LAB — CBC
HCT: 43.6 % (ref 39.0–52.0)
Hemoglobin: 14.8 g/dL (ref 13.0–17.0)
MCH: 31 pg (ref 26.0–34.0)
MCHC: 33.9 g/dL (ref 30.0–36.0)
MCV: 91.2 fL (ref 80.0–100.0)
Platelets: 284 10*3/uL (ref 150–400)
RBC: 4.78 MIL/uL (ref 4.22–5.81)
RDW: 13.4 % (ref 11.5–15.5)
WBC: 7.2 10*3/uL (ref 4.0–10.5)
nRBC: 0 % (ref 0.0–0.2)

## 2023-08-03 LAB — BASIC METABOLIC PANEL
Anion gap: 10 (ref 5–15)
BUN: 19 mg/dL (ref 8–23)
CO2: 29 mmol/L (ref 22–32)
Calcium: 9.5 mg/dL (ref 8.9–10.3)
Chloride: 98 mmol/L (ref 98–111)
Creatinine, Ser: 1.11 mg/dL (ref 0.61–1.24)
GFR, Estimated: >60 mL/min (ref >60)
Glucose, Bld: 113 mg/dL — ABNORMAL HIGH (ref 70–99)
Potassium: 3.5 mmol/L (ref 3.5–5.1)
Sodium: 137 mmol/L (ref 135–145)

## 2023-08-03 LAB — SURGICAL PCR SCREEN
MRSA, PCR: NEGATIVE
Staphylococcus aureus: NEGATIVE

## 2023-08-03 NOTE — Progress Notes (Signed)
 For Anesthesia: PCP - Olive Bass, FNP  Cardiologist - N/A  Bowel Prep reminder:  Chest x-ray -  EKG - 08/03/23 Stress Test -  ECHO -  Cardiac Cath -  Pacemaker/ICD device last checked: Pacemaker orders received: Device Rep notified:  Spinal Cord Stimulator:N/A  Sleep Study - N/A CPAP -   Fasting Blood Sugar - N/A Checks Blood Sugar _____ times a day Date and result of last Hgb A1c-  Last dose of GLP1 agonist- N/A GLP1 instructions:   Last dose of SGLT-2 inhibitors- N/A SGLT-2 instructions:   Blood Thinner Instructions:N/A Aspirin Instructions: Last Dose:  Activity level: Can go up a flight of stairs and activities of daily living without stopping and without chest pain and/or shortness of breath   Able to exercise without chest pain and/or shortness of breath  Anesthesia review:   Patient denies shortness of breath, fever, cough and chest pain at PAT appointment   Patient verbalized understanding of instructions that were given to them at the PAT appointment. Patient was also instructed that they will need to review over the PAT instructions again at home before surgery.

## 2023-08-06 ENCOUNTER — Other Ambulatory Visit: Payer: Self-pay | Admitting: Family Medicine

## 2023-08-06 NOTE — Telephone Encounter (Signed)
 Last OV 05/21/23 Next OV not scheduled   Last refill 02/09/23 Qty #90/1

## 2023-08-09 ENCOUNTER — Telehealth: Payer: Self-pay | Admitting: *Deleted

## 2023-08-09 NOTE — Telephone Encounter (Signed)
 [facility] pre-op call completed.

## 2023-08-09 NOTE — H&P (Signed)
 TOTAL KNEE ADMISSION H&P  Patient is being admitted for left total knee arthroplasty.  Subjective:  Chief Complaint:left knee pain.  HPI: Luis Knapp, 73 y.o. male, has a history of pain and functional disability in the left knee due to arthritis and has failed non-surgical conservative treatments for greater than 12 weeks to includeNSAID's and/or analgesics, corticosteriod injections, viscosupplementation injections, and activity modification.  Onset of symptoms was gradual, starting several years ago with gradually worsening course since that time. The patient noted no past surgery on the left knee(s).  Patient currently rates pain in the left knee(s) at 10 out of 10 with activity. Patient has night pain, worsening of pain with activity and weight bearing, pain that interferes with activities of daily living, pain with passive range of motion, crepitus, and joint swelling.  Patient has evidence of subchondral sclerosis, periarticular osteophytes, and joint space narrowing by imaging studies. There is no active infection.  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   Medicare annual wellness visit, initial 10/18/2015   Hydrocele, right 06/24/2013   Routine general medical examination at a health care facility 12/04/2010   Morbid obesity (HCC) 05/18/2010   SCIATICA 05/18/2010   POLYP, COLON 08/03/2007   Essential hypertension 08/03/2007   Diverticulosis of colon 08/03/2007   Past Medical History:  Diagnosis Date   Allergic rhinitis due to pollen    Allergy    Arthritis    Constipation 2018   Diverticulosis of colon (without mention of hemorrhage)    ED (erectile dysfunction)    Elevated PSA    GERD (gastroesophageal reflux disease)     History of colon polyps    2008--  PRE CANEROUS/   2013-- ADENOMA   Low back pain 2018   Muscle spasm 2018   Muscle weakness 2018   Post-void dribbling 2018   Prostate cancer (HCC) 11/01/2016   prostate cancer   [name]    Stress incontinence 2018   Unspecified essential hypertension    Urinary retention 2018   Wears glasses     Past Surgical History:  Procedure Laterality Date   COLONOSCOPY     COLONOSCOPY WITH PROPOFOL  10-30-2011  &  2008   POLYPECTOMY   HYDROCELE EXCISION Right 07/18/2013   Procedure: RIGHT HYDROCELECTOMY ADULT;  Surgeon: Crist Fat, MD;  Location: [facility];  Service: Urology;  Laterality: Right;   LYMPH NODE DISSECTION Bilateral 11/01/2016   Procedure: BILATERAL PELVIC LYMPH NODE DISSECTION;  Surgeon: Crist Fat, MD;  Location: [facility];  Service: Urology;  Laterality: Bilateral;   POLYPECTOMY     PROSTATE BIOPSY     ROBOT ASSISTED LAPAROSCOPIC RADICAL PROSTATECTOMY N/A 11/01/2016   Procedure: XI ROBOTIC ASSISTED LAPAROSCOPIC RADICAL PROSTATECTOMY;  Surgeon: Crist Fat, MD;  Location: [facility];  Service: Urology;  Laterality: N/A;    No current facility-administered medications for this encounter.   Current Outpatient Medications  Medication Sig Dispense Refill Last Dose/Taking   amLODipine (NORVASC) 5 MG tablet Take 1 tablet (5 mg total) by mouth at bedtime. 90 tablet 3 Taking   Cholecalciferol (VITAMIN D-3) 1000 units CAPS Take 1,000 Units by mouth daily.   Taking   Coenzyme Q10 (COQ-10) 100 MG CAPS Take 100 mg by mouth 2 (two) times daily.  Taking   rosuvastatin (CRESTOR) 10 MG tablet Take 1 tablet (10 mg total) by mouth daily. 90 tablet 3 Taking   TURMERIC PO Take 538 mg by mouth daily.   Taking   valsartan-hydrochlorothiazide (DIOVAN-HCT) 320-25 MG tablet Take 1 tablet by mouth daily. 90 tablet 3 Taking   allopurinol (ZYLOPRIM) 300 MG tablet TAKE 1 TABLET BY MOUTH DAILY 90 tablet 1    No Known  Allergies  Social History   Tobacco Use   Smoking status: Former    Current packs/day: 0.00    Types: Cigarettes    Quit date: 10/17/1995    Years since quitting: 27.8   Smokeless tobacco: Never  Substance Use Topics   Alcohol use: Not Currently    Family History  Problem Relation Age of Onset   Coronary artery disease Father    Heart attack Father        2nd one fatal   Pulmonary embolism Father        ?   Dementia Father    Heart disease Father    Colon cancer Father    Dementia Mother    Hypertension Mother    Macular degeneration Mother    Kidney disease Mother    Diabetes Sister    Non-Hodgkin's lymphoma Sister    Colon cancer Maternal Grandfather    Pancreatic cancer Sister    Prostate cancer Neg Hx    Rectal cancer Neg Hx    Colon polyps Neg Hx    Esophageal cancer Neg Hx      Review of Systems  Objective:  Physical Exam Vitals reviewed.  Constitutional:      Appearance: Normal appearance. He is obese.  HENT:     Head: Normocephalic and atraumatic.  Eyes:     Extraocular Movements: Extraocular movements intact.     Pupils: Pupils are equal, round, and reactive to light.  Cardiovascular:     Rate and Rhythm: Normal rate.  Abdominal:     Palpations: Abdomen is soft.  Musculoskeletal:     Cervical back: Normal range of motion.     Left knee: Effusion and bony tenderness present. Decreased range of motion. Tenderness present over the medial joint line, lateral joint line and patellar tendon. Abnormal alignment and abnormal meniscus.  Neurological:     Mental Status: He is oriented to person, place, and time.  Psychiatric:        Behavior: Behavior normal.     Vital signs in last 24 hours:    Labs:   Estimated body mass index is 37.77 kg/m as calculated from the following:   Height as of 08/03/23: 5\' 6"  (1.676 m).   Weight as of 08/03/23: 106.1 kg.   Imaging Review Plain radiographs demonstrate moderate degenerative joint disease of the left  knee(s). However, a MRI of the knee shows full-thickness cartilage loss. The overall alignment ismild varus. The bone quality appears to be excellent for age and reported activity level.      Assessment/Plan:  End stage arthritis, left knee   The patient history, physical examination, clinical judgment of the provider and imaging studies are consistent with end stage degenerative joint disease of the left knee(s) and total knee arthroplasty is deemed medically necessary. The treatment options including medical management, injection therapy arthroscopy and arthroplasty were discussed at length. The risks and benefits of total knee arthroplasty were presented and reviewed. The risks due to aseptic loosening, infection, stiffness, patella tracking problems, thromboembolic complications and other imponderables were discussed. The  patient acknowledged the explanation, agreed to proceed with the plan and consent was signed. Patient is being admitted for inpatient treatment for surgery, pain control, PT, OT, prophylactic antibiotics, VTE prophylaxis, progressive ambulation and ADL's and discharge planning. The patient is planning to be discharged home with home health services

## 2023-08-09 NOTE — Care Plan (Signed)
 [facility] RNCM call to patient to discuss his upcoming Left total knee arthroplasty with Dr. Magnus Ivan on 08/10/23 at [facility]. He lives with his wife and his plan is to return home with her assistance. He has a rollator that he has purchased. Anticipate HHPT will be needed after a short hospital stay. Referral made to [facility] after choice provided. Reviewed all post op care instructions and questions answered. Will continue to follow for needs.

## 2023-08-10 ENCOUNTER — Encounter (HOSPITAL_COMMUNITY)
Admission: RE | Disposition: A | Discharge status: Home Health | Payer: Self-pay | Source: Home / Self Care | Attending: Orthopaedic Surgery

## 2023-08-10 ENCOUNTER — Ambulatory Visit (HOSPITAL_COMMUNITY): Admitting: Anesthesiology

## 2023-08-10 ENCOUNTER — Encounter (HOSPITAL_COMMUNITY): Payer: Self-pay | Admitting: Orthopaedic Surgery

## 2023-08-10 ENCOUNTER — Observation Stay (HOSPITAL_COMMUNITY)

## 2023-08-10 ENCOUNTER — Other Ambulatory Visit: Payer: Self-pay

## 2023-08-10 ENCOUNTER — Inpatient Hospital Stay (HOSPITAL_COMMUNITY)
Admission: RE | Admit: 2023-08-10 | Discharge: 2023-08-13 | DRG: 470 | Disposition: A | Discharge status: Home Health | Attending: Orthopaedic Surgery | Admitting: Orthopaedic Surgery

## 2023-08-10 DIAGNOSIS — Z79899 Other long term (current) drug therapy: Secondary | ICD-10-CM

## 2023-08-10 DIAGNOSIS — M1712 Unilateral primary osteoarthritis, left knee: Secondary | ICD-10-CM | POA: Diagnosis not present

## 2023-08-10 DIAGNOSIS — Z841 Family history of disorders of kidney and ureter: Secondary | ICD-10-CM | POA: Diagnosis not present

## 2023-08-10 DIAGNOSIS — Z8249 Family history of ischemic heart disease and other diseases of the circulatory system: Secondary | ICD-10-CM | POA: Diagnosis not present

## 2023-08-10 DIAGNOSIS — Z807 Family history of other malignant neoplasms of lymphoid, hematopoietic and related tissues: Secondary | ICD-10-CM

## 2023-08-10 DIAGNOSIS — Z833 Family history of diabetes mellitus: Secondary | ICD-10-CM | POA: Diagnosis not present

## 2023-08-10 DIAGNOSIS — Z87891 Personal history of nicotine dependence: Secondary | ICD-10-CM

## 2023-08-10 DIAGNOSIS — Z6837 Body mass index (BMI) 37.0-37.9, adult: Secondary | ICD-10-CM

## 2023-08-10 DIAGNOSIS — M1A9XX0 Chronic gout, unspecified, without tophus (tophi): Secondary | ICD-10-CM | POA: Diagnosis present

## 2023-08-10 DIAGNOSIS — Z800 Family history of malignant neoplasm of digestive organs: Secondary | ICD-10-CM | POA: Diagnosis not present

## 2023-08-10 DIAGNOSIS — Z8546 Personal history of malignant neoplasm of prostate: Secondary | ICD-10-CM | POA: Diagnosis not present

## 2023-08-10 DIAGNOSIS — Z471 Aftercare following joint replacement surgery: Secondary | ICD-10-CM | POA: Diagnosis not present

## 2023-08-10 DIAGNOSIS — R609 Edema, unspecified: Secondary | ICD-10-CM | POA: Diagnosis not present

## 2023-08-10 DIAGNOSIS — E669 Obesity, unspecified: Secondary | ICD-10-CM | POA: Diagnosis present

## 2023-08-10 DIAGNOSIS — E782 Mixed hyperlipidemia: Secondary | ICD-10-CM | POA: Diagnosis present

## 2023-08-10 DIAGNOSIS — G8918 Other acute postprocedural pain: Secondary | ICD-10-CM | POA: Diagnosis not present

## 2023-08-10 DIAGNOSIS — F05 Delirium due to known physiological condition: Secondary | ICD-10-CM | POA: Diagnosis not present

## 2023-08-10 DIAGNOSIS — Z96652 Presence of left artificial knee joint: Secondary | ICD-10-CM

## 2023-08-10 DIAGNOSIS — I10 Essential (primary) hypertension: Secondary | ICD-10-CM | POA: Diagnosis present

## 2023-08-10 HISTORY — PX: TOTAL KNEE ARTHROPLASTY: SHX125

## 2023-08-10 SURGERY — ARTHROPLASTY, KNEE, TOTAL
Anesthesia: Monitor Anesthesia Care | Site: Knee | Laterality: Left

## 2023-08-10 MED ORDER — BUPIVACAINE-EPINEPHRINE 0.25% -1:200000 IJ SOLN
INTRAMUSCULAR | Status: DC | PRN
  Administered 2023-08-10: 30 mL

## 2023-08-10 MED ORDER — METOCLOPRAMIDE HCL 5 MG PO TABS: 5.0000 mg | ORAL_TABLET | Freq: Three times a day (TID) | ORAL | Status: DC | PRN

## 2023-08-10 MED ORDER — IRBESARTAN 150 MG PO TABS
300.0000 mg | ORAL_TABLET | Freq: Every day | ORAL | Status: DC
  Administered 2023-08-11 – 2023-08-12 (×2): 300 mg via ORAL
  Filled 2023-08-10 (×2): qty 2

## 2023-08-10 MED ORDER — ALUM & MAG HYDROXIDE-SIMETH 200-200-20 MG/5ML PO SUSP: 30.0000 mL | ORAL | Status: DC | PRN

## 2023-08-10 MED ORDER — TRANEXAMIC ACID-NACL 1000-0.7 MG/100ML-% IV SOLN
1000.0000 mg | INTRAVENOUS | Status: AC
  Administered 2023-08-10: 1000 mg via INTRAVENOUS
  Filled 2023-08-10: qty 100

## 2023-08-10 MED ORDER — HYDROMORPHONE HCL 1 MG/ML IJ SOLN
0.5000 mg | INTRAMUSCULAR | Status: DC | PRN
  Administered 2023-08-10 – 2023-08-11 (×4): 1 mg via INTRAVENOUS
  Filled 2023-08-10 (×4): qty 1

## 2023-08-10 MED ORDER — LIDOCAINE 2% (20 MG/ML) 5 ML SYRINGE
INTRAMUSCULAR | Status: DC | PRN
  Administered 2023-08-10: 40 mg via INTRAVENOUS

## 2023-08-10 MED ORDER — METOCLOPRAMIDE HCL 5 MG/ML IJ SOLN
5.0000 mg | Freq: Three times a day (TID) | INTRAMUSCULAR | Status: DC | PRN
  Administered 2023-08-11 (×2): 10 mg via INTRAVENOUS
  Filled 2023-08-10 (×2): qty 2

## 2023-08-10 MED ORDER — SODIUM CHLORIDE 0.9 % IV SOLN: INTRAVENOUS | Status: AC

## 2023-08-10 MED ORDER — OXYCODONE HCL 5 MG PO TABS
ORAL_TABLET | ORAL | Status: AC
  Filled 2023-08-10: qty 1

## 2023-08-10 MED ORDER — OXYCODONE HCL 5 MG PO TABS
10.0000 mg | ORAL_TABLET | ORAL | Status: DC | PRN
  Administered 2023-08-10 – 2023-08-11 (×3): 10 mg via ORAL
  Filled 2023-08-10: qty 2

## 2023-08-10 MED ORDER — FENTANYL CITRATE PF 50 MCG/ML IJ SOSY
50.0000 ug | PREFILLED_SYRINGE | INTRAMUSCULAR | Status: DC
  Administered 2023-08-10: 100 ug via INTRAVENOUS
  Filled 2023-08-10: qty 2

## 2023-08-10 MED ORDER — CEFAZOLIN SODIUM-DEXTROSE 2-4 GM/100ML-% IV SOLN
2.0000 g | INTRAVENOUS | Status: AC
  Administered 2023-08-10: 2 g via INTRAVENOUS
  Filled 2023-08-10: qty 100

## 2023-08-10 MED ORDER — HYDROMORPHONE HCL 1 MG/ML IJ SOLN
0.2500 mg | INTRAMUSCULAR | Status: DC | PRN
  Administered 2023-08-10: 0.5 mg via INTRAVENOUS
  Administered 2023-08-10 (×2): 0.25 mg via INTRAVENOUS
  Filled 2023-08-10: qty 1

## 2023-08-10 MED ORDER — HYDROCHLOROTHIAZIDE 25 MG PO TABS
25.0000 mg | ORAL_TABLET | Freq: Every day | ORAL | Status: DC
  Administered 2023-08-11 – 2023-08-12 (×2): 25 mg via ORAL
  Filled 2023-08-10 (×2): qty 1

## 2023-08-10 MED ORDER — BUPIVACAINE-EPINEPHRINE (PF) 0.25% -1:200000 IJ SOLN
INTRAMUSCULAR | Status: AC
  Filled 2023-08-10: qty 30

## 2023-08-10 MED ORDER — METHOCARBAMOL 1000 MG/10ML IJ SOLN: 500.0000 mg | Freq: Four times a day (QID) | INTRAMUSCULAR | Status: DC | PRN

## 2023-08-10 MED ORDER — ACETAMINOPHEN 325 MG PO TABS
325.0000 mg | ORAL_TABLET | Freq: Four times a day (QID) | ORAL | Status: DC | PRN
  Administered 2023-08-12 – 2023-08-13 (×3): 650 mg via ORAL
  Filled 2023-08-10 (×3): qty 2

## 2023-08-10 MED ORDER — PANTOPRAZOLE SODIUM 40 MG PO TBEC
40.0000 mg | DELAYED_RELEASE_TABLET | Freq: Every day | ORAL | Status: DC
  Administered 2023-08-11 – 2023-08-12 (×2): 40 mg via ORAL
  Filled 2023-08-10 (×2): qty 1

## 2023-08-10 MED ORDER — ASPIRIN 81 MG PO CHEW
81.0000 mg | CHEWABLE_TABLET | Freq: Two times a day (BID) | ORAL | Status: DC
  Administered 2023-08-11 – 2023-08-12 (×4): 81 mg via ORAL
  Filled 2023-08-10 (×4): qty 1

## 2023-08-10 MED ORDER — ONDANSETRON HCL 4 MG PO TABS
4.0000 mg | ORAL_TABLET | Freq: Four times a day (QID) | ORAL | Status: DC | PRN
  Administered 2023-08-11: 4 mg via ORAL
  Filled 2023-08-10: qty 1

## 2023-08-10 MED ORDER — SODIUM CHLORIDE 0.9 % IR SOLN
Status: DC | PRN
  Administered 2023-08-10: 1000 mL

## 2023-08-10 MED ORDER — AMISULPRIDE (ANTIEMETIC) 5 MG/2ML IV SOLN: 10.0000 mg | Freq: Once | INTRAVENOUS | Status: DC | PRN

## 2023-08-10 MED ORDER — OXYCODONE HCL 5 MG PO TABS
5.0000 mg | ORAL_TABLET | Freq: Once | ORAL | Status: AC | PRN
  Administered 2023-08-10: 5 mg via ORAL

## 2023-08-10 MED ORDER — ONDANSETRON HCL 4 MG/2ML IJ SOLN: 4.0000 mg | Freq: Once | INTRAMUSCULAR | Status: DC | PRN

## 2023-08-10 MED ORDER — PHENYLEPHRINE HCL-NACL 20-0.9 MG/250ML-% IV SOLN
INTRAVENOUS | Status: DC | PRN
  Administered 2023-08-10: 15 ug/min via INTRAVENOUS

## 2023-08-10 MED ORDER — MENTHOL 3 MG MT LOZG: 1.0000 | LOZENGE | OROMUCOSAL | Status: DC | PRN

## 2023-08-10 MED ORDER — METHOCARBAMOL 500 MG PO TABS
500.0000 mg | ORAL_TABLET | Freq: Four times a day (QID) | ORAL | Status: DC | PRN
  Administered 2023-08-10 – 2023-08-13 (×6): 500 mg via ORAL
  Filled 2023-08-10 (×5): qty 1

## 2023-08-10 MED ORDER — DIPHENHYDRAMINE HCL 12.5 MG/5ML PO ELIX
12.5000 mg | ORAL_SOLUTION | ORAL | Status: DC | PRN
  Administered 2023-08-12: 25 mg via ORAL
  Filled 2023-08-10: qty 10

## 2023-08-10 MED ORDER — VALSARTAN-HYDROCHLOROTHIAZIDE 320-25 MG PO TABS
1.0000 | ORAL_TABLET | Freq: Every day | ORAL | Status: DC
Start: 2023-08-10 — End: 2023-08-10

## 2023-08-10 MED ORDER — ORAL CARE MOUTH RINSE: 15.0000 mL | Freq: Once | OROMUCOSAL | Status: AC

## 2023-08-10 MED ORDER — DEXAMETHASONE SODIUM PHOSPHATE 10 MG/ML IJ SOLN
INTRAMUSCULAR | Status: DC | PRN
  Administered 2023-08-10: 8 mg via INTRAVENOUS

## 2023-08-10 MED ORDER — STERILE WATER FOR IRRIGATION IR SOLN
Status: DC | PRN
  Administered 2023-08-10: 1000 mL

## 2023-08-10 MED ORDER — POVIDONE-IODINE 10 % EX SWAB: 2.0000 | Freq: Once | CUTANEOUS | Status: DC

## 2023-08-10 MED ORDER — ROSUVASTATIN CALCIUM 10 MG PO TABS
10.0000 mg | ORAL_TABLET | Freq: Every day | ORAL | Status: DC
  Administered 2023-08-11 – 2023-08-12 (×2): 10 mg via ORAL
  Filled 2023-08-10 (×2): qty 1

## 2023-08-10 MED ORDER — BUPIVACAINE IN DEXTROSE 0.75-8.25 % IT SOLN
INTRATHECAL | Status: DC | PRN
  Administered 2023-08-10: 1.8 mL via INTRATHECAL

## 2023-08-10 MED ORDER — ONDANSETRON HCL 4 MG/2ML IJ SOLN
4.0000 mg | Freq: Four times a day (QID) | INTRAMUSCULAR | Status: DC | PRN
  Administered 2023-08-10 – 2023-08-11 (×2): 4 mg via INTRAVENOUS
  Filled 2023-08-10 (×2): qty 2

## 2023-08-10 MED ORDER — 0.9 % SODIUM CHLORIDE (POUR BTL) OPTIME
TOPICAL | Status: DC | PRN
  Administered 2023-08-10: 1000 mL

## 2023-08-10 MED ORDER — OXYCODONE HCL 5 MG/5ML PO SOLN: 5.0000 mg | Freq: Once | ORAL | Status: AC | PRN

## 2023-08-10 MED ORDER — PROPOFOL 500 MG/50ML IV EMUL
INTRAVENOUS | Status: DC | PRN
  Administered 2023-08-10: 95 ug/kg/min via INTRAVENOUS

## 2023-08-10 MED ORDER — METHOCARBAMOL 500 MG PO TABS
ORAL_TABLET | ORAL | Status: AC
  Filled 2023-08-10: qty 1

## 2023-08-10 MED ORDER — VITAMIN D 25 MCG (1000 UNIT) PO TABS
1000.0000 [IU] | ORAL_TABLET | Freq: Every day | ORAL | Status: DC
  Administered 2023-08-11 – 2023-08-12 (×2): 1000 [IU] via ORAL
  Filled 2023-08-10 (×2): qty 1

## 2023-08-10 MED ORDER — AMLODIPINE BESYLATE 5 MG PO TABS
5.0000 mg | ORAL_TABLET | Freq: Every day | ORAL | Status: DC
  Administered 2023-08-10 – 2023-08-12 (×3): 5 mg via ORAL
  Filled 2023-08-10 (×3): qty 1

## 2023-08-10 MED ORDER — ALLOPURINOL 300 MG PO TABS
300.0000 mg | ORAL_TABLET | Freq: Every day | ORAL | Status: DC
  Administered 2023-08-10 – 2023-08-12 (×3): 300 mg via ORAL
  Filled 2023-08-10 (×3): qty 1

## 2023-08-10 MED ORDER — ONDANSETRON HCL 4 MG/2ML IJ SOLN
INTRAMUSCULAR | Status: DC | PRN
Start: 2023-08-10 — End: 2023-08-10
  Administered 2023-08-10: 4 mg via INTRAVENOUS

## 2023-08-10 MED ORDER — FAMOTIDINE IN NACL 20-0.9 MG/50ML-% IV SOLN
20.0000 mg | Freq: Once | INTRAVENOUS | Status: AC
  Administered 2023-08-10: 20 mg via INTRAVENOUS
  Filled 2023-08-10: qty 50

## 2023-08-10 MED ORDER — MIDAZOLAM HCL 2 MG/2ML IJ SOLN
1.0000 mg | INTRAMUSCULAR | Status: DC
  Administered 2023-08-10: 2 mg via INTRAVENOUS
  Filled 2023-08-10: qty 2

## 2023-08-10 MED ORDER — CEFAZOLIN SODIUM-DEXTROSE 2-4 GM/100ML-% IV SOLN
2.0000 g | Freq: Four times a day (QID) | INTRAVENOUS | Status: AC
  Administered 2023-08-10 – 2023-08-11 (×2): 2 g via INTRAVENOUS
  Filled 2023-08-10 (×2): qty 100

## 2023-08-10 MED ORDER — CHLORHEXIDINE GLUCONATE 0.12 % MT SOLN
15.0000 mL | Freq: Once | OROMUCOSAL | Status: AC
  Administered 2023-08-10: 15 mL via OROMUCOSAL

## 2023-08-10 MED ORDER — DOCUSATE SODIUM 100 MG PO CAPS
100.0000 mg | ORAL_CAPSULE | Freq: Two times a day (BID) | ORAL | Status: DC
  Administered 2023-08-10 – 2023-08-12 (×5): 100 mg via ORAL
  Filled 2023-08-10 (×5): qty 1

## 2023-08-10 MED ORDER — PROPOFOL 10 MG/ML IV BOLUS
INTRAVENOUS | Status: DC | PRN
  Administered 2023-08-10: 20 mg via INTRAVENOUS

## 2023-08-10 MED ORDER — OXYCODONE HCL 5 MG PO TABS
5.0000 mg | ORAL_TABLET | ORAL | Status: DC | PRN
  Administered 2023-08-10: 10 mg via ORAL
  Administered 2023-08-11: 5 mg via ORAL
  Administered 2023-08-11: 10 mg via ORAL
  Filled 2023-08-10 (×5): qty 2

## 2023-08-10 MED ORDER — ACETAMINOPHEN 500 MG PO TABS
1000.0000 mg | ORAL_TABLET | Freq: Once | ORAL | Status: AC
  Administered 2023-08-10: 1000 mg via ORAL
  Filled 2023-08-10: qty 2

## 2023-08-10 MED ORDER — PHENOL 1.4 % MT LIQD
1.0000 | OROMUCOSAL | Status: DC | PRN
  Filled 2023-08-10: qty 177

## 2023-08-10 MED ORDER — LACTATED RINGERS IV SOLN: INTRAVENOUS | Status: DC

## 2023-08-10 SURGICAL SUPPLY — 55 items
BAG COUNTER SPONGE SURGICOUNT (BAG) IMPLANT
BAG ZIPLOCK 12X15 (MISCELLANEOUS) ×2 IMPLANT
BENZOIN TINCTURE PRP APPL 2/3 (GAUZE/BANDAGES/DRESSINGS) IMPLANT
BLADE SAG 18X100X1.27 (BLADE) ×2 IMPLANT
BLADE SURG SZ10 CARB STEEL (BLADE) IMPLANT
BNDG ELASTIC 6INX 5YD STR LF (GAUZE/BANDAGES/DRESSINGS) ×4 IMPLANT
BOWL SMART MIX CTS (DISPOSABLE) IMPLANT
CEMENT BONE R 1X40 (Cement) IMPLANT
COMP FEM PS KNEE STD 10 LT (Knees) ×1 IMPLANT
COMP PATELLA PEG 3 32 (Joint) ×1 IMPLANT
COMP TIB KNEE PS 0D LT (Joint) ×1 IMPLANT
COMPONENT FEM PS KN STD 10 LT (Knees) IMPLANT
COMPONENT PATELLA PEG 3 32 (Joint) IMPLANT
COMPONENT TIB KNEE PS 0D LT (Joint) IMPLANT
COOLER ICEMAN CLASSIC (MISCELLANEOUS) ×2 IMPLANT
COVER SURGICAL LIGHT HANDLE (MISCELLANEOUS) ×2 IMPLANT
CUFF TRNQT CYL 34X4.125X (TOURNIQUET CUFF) ×2 IMPLANT
DRAPE INCISE IOBAN 66X45 STRL (DRAPES) ×2 IMPLANT
DRAPE U-SHAPE 47X51 STRL (DRAPES) ×2 IMPLANT
DURAPREP 26ML APPLICATOR (WOUND CARE) ×2 IMPLANT
ELECT BLADE TIP CTD 4 INCH (ELECTRODE) ×2 IMPLANT
ELECT REM PT RETURN 15FT ADLT (MISCELLANEOUS) ×2 IMPLANT
GAUZE PAD ABD 8X10 STRL (GAUZE/BANDAGES/DRESSINGS) ×4 IMPLANT
GAUZE SPONGE 4X4 12PLY STRL (GAUZE/BANDAGES/DRESSINGS) ×2 IMPLANT
GAUZE XEROFORM 1X8 LF (GAUZE/BANDAGES/DRESSINGS) IMPLANT
GLOVE BIO SURGEON STRL SZ7.5 (GLOVE) ×2 IMPLANT
GLOVE BIOGEL PI IND STRL 8 (GLOVE) ×4 IMPLANT
GLOVE ECLIPSE 8.0 STRL XLNG CF (GLOVE) ×2 IMPLANT
GOWN STRL REUS W/ TWL XL LVL3 (GOWN DISPOSABLE) ×4 IMPLANT
HOLDER FOLEY CATH W/STRAP (MISCELLANEOUS) IMPLANT
IMMOBILIZER KNEE 20 (SOFTGOODS) ×1 IMPLANT
IMMOBILIZER KNEE 20 THIGH 36 (SOFTGOODS) ×2 IMPLANT
KIT TURNOVER KIT A (KITS) IMPLANT
MANIFOLD NEPTUNE II (INSTRUMENTS) ×2 IMPLANT
NS IRRIG 1000ML POUR BTL (IV SOLUTION) ×2 IMPLANT
PACK TOTAL KNEE CUSTOM (KITS) ×2 IMPLANT
PAD COLD SHLDR WRAP-ON (PAD) ×2 IMPLANT
PADDING CAST COTTON 6X4 STRL (CAST SUPPLIES) ×4 IMPLANT
PIN DRILL HDLS TROCAR 75 4PK (PIN) IMPLANT
PROTECTOR NERVE ULNAR (MISCELLANEOUS) ×2 IMPLANT
SCREW FEMALE HEX FIX 25X2.5 (ORTHOPEDIC DISPOSABLE SUPPLIES) IMPLANT
SET HNDPC FAN SPRY TIP SCT (DISPOSABLE) ×2 IMPLANT
SET PAD KNEE POSITIONER (MISCELLANEOUS) ×2 IMPLANT
SPIKE FLUID TRANSFER (MISCELLANEOUS) IMPLANT
STAPLER SKIN PROX WIDE 3.9 (STAPLE) IMPLANT
STEM ARTISURF EF 12 SZ8-11 (Stem) IMPLANT
STRIP CLOSURE SKIN 1/2X4 (GAUZE/BANDAGES/DRESSINGS) IMPLANT
SUT MNCRL AB 4-0 PS2 18 (SUTURE) IMPLANT
SUT VIC AB 0 CT1 36 (SUTURE) ×2 IMPLANT
SUT VIC AB 1 CT1 36 (SUTURE) ×4 IMPLANT
SUT VIC AB 2-0 CT1 TAPERPNT 27 (SUTURE) ×4 IMPLANT
TOWEL GREEN STERILE FF (TOWEL DISPOSABLE) ×2 IMPLANT
TRAY FOLEY MTR SLVR 16FR STAT (SET/KITS/TRAYS/PACK) IMPLANT
WATER STERILE IRR 1000ML POUR (IV SOLUTION) ×4 IMPLANT
YANKAUER SUCT BULB TIP NO VENT (SUCTIONS) ×2 IMPLANT

## 2023-08-10 NOTE — Anesthesia Procedure Notes (Signed)
 Spinal  Patient location during procedure: OR Start time: 08/10/2023 2:32 PM End time: 08/10/2023 2:32 PM Reason for block: surgical anesthesia Staffing Performed: resident/CRNA  Anesthesiologist: Lannie Fields, DO Resident/CRNA: Theodosia Quay, CRNA Performed by: Theodosia Quay, CRNA Authorized by: Lannie Fields, DO   Preanesthetic Checklist Completed: patient identified, IV checked, site marked, risks and benefits discussed, surgical consent, monitors and equipment checked, pre-op evaluation and timeout performed Spinal Block Patient position: sitting Prep: DuraPrep Patient monitoring: heart rate, cardiac monitor, continuous pulse ox and blood pressure Approach: midline Location: L4-5 Injection technique: single-shot Needle Needle type: Sprotte  Needle gauge: 24 G Needle length: 9 cm Needle insertion depth: 8 cm Assessment Sensory level: T6 Events: CSF return Additional Notes IV functioning, monitors applied to pt. Expiration date of kit checked and confirmed to be in date. Sterile prep and drape, hand hygiene and sterile gloved used. Pt was positioned and spine was prepped in sterile fashion. Skin was anesthetized with lidocaine. Free flow of clear CSF obtained prior to injecting local anesthetic into CSF x 1 attempt. Spinal needle aspirated freely following injection. Needle was carefully withdrawn, and pt tolerated procedure well. Loss of motor and sensory on exam post injection.  Lot [id number] 11-11-24

## 2023-08-10 NOTE — Transfer of Care (Signed)
 Immediate Anesthesia Transfer of Care Note  Patient: Luis Knapp  Procedure(s) Performed: Procedure(s) with comments: ARTHROPLASTY, KNEE, TOTAL (Left) - LEFT TOTAL KNEE ARTHROPLASTY  SPINAL + BLOCK  Patient Location: PACU  Anesthesia Type:Spinal  Level of Consciousness:  sedated, patient cooperative and responds to stimulation  Airway & Oxygen Therapy:Patient Spontanous Breathing and Patient connected to face mask oxgen  Post-op Assessment:  Report given to PACU RN and Post -op Vital signs reviewed and stable  Post vital signs:  Reviewed and stable  Last Vitals:  Vitals:   08/10/23 1400 08/10/23 1604  BP: (!) 153/97 123/67  Pulse: 76 85  Resp: 14 19  Temp:    SpO2: 97% 94%    Complications: No apparent anesthesia complications

## 2023-08-10 NOTE — Interval H&P Note (Signed)
 History and Physical Interval Note: The patient understands that he is here today for a left total knee replacement to treat his significant left knee pain and arthritis.  There has been no acute or interval changes in medical status.  The risks and benefits of surgery have been discussed in detail and informed consent has been obtained.  The left operative knee has been marked.  08/10/2023 12:50 PM  Luis Knapp  has presented today for surgery, with the diagnosis of Primary osteoarthritis of left knee.  The various methods of treatment have been discussed with the patient and family. After consideration of risks, benefits and other options for treatment, the patient has consented to  Procedure(s) with comments: ARTHROPLASTY, KNEE, TOTAL (Left) - LEFT TOTAL KNEE ARTHROPLASTY  SPINAL + BLOCK as a surgical intervention.  The patient's history has been reviewed, patient examined, no change in status, stable for surgery.  I have reviewed the patient's chart and labs.  Questions were answered to the patient's satisfaction.     Kathryne Hitch

## 2023-08-10 NOTE — Anesthesia Preprocedure Evaluation (Addendum)
 Anesthesia Evaluation  Patient identified by MRN, date of birth, ID band Patient awake    Reviewed: Allergy & Precautions, NPO status , Patient's Chart, lab work & pertinent test results  Airway Mallampati: III  TM Distance: >3 FB Neck ROM: Full    Dental  (+) Teeth Intact, Dental Advisory Given   Pulmonary former smoker Snores at night, no sleep study   Pulmonary exam normal breath sounds clear to auscultation       Cardiovascular hypertension (158/70 preop, per pt normally 140-150 SBP), Pt. on medications Normal cardiovascular exam Rhythm:Regular Rate:Normal     Neuro/Psych negative neurological ROS  negative psych ROS   GI/Hepatic Neg liver ROS,GERD  Controlled,,  Endo/Other  BMI 38  Renal/GU negative Renal ROS  negative genitourinary   Musculoskeletal  (+) Arthritis , Osteoarthritis,    Abdominal  (+) + obese  Peds  Hematology negative hematology ROS (+) Hb 14.8, plt 284   Anesthesia Other Findings   Reproductive/Obstetrics negative OB ROS                             Anesthesia Physical Anesthesia Plan  ASA: 2  Anesthesia Plan: MAC and Spinal   Post-op Pain Management: Tylenol PO (pre-op)*   Induction:   PONV Risk Score and Plan: 2 and Propofol infusion and TIVA  Airway Management Planned: Natural Airway and Simple Face Mask  Additional Equipment: None  Intra-op Plan:   Post-operative Plan:   Informed Consent: I have reviewed the patients History and Physical, chart, labs and discussed the procedure including the risks, benefits and alternatives for the proposed anesthesia with the patient or authorized representative who has indicated his/her understanding and acceptance.       Plan Discussed with: CRNA  Anesthesia Plan Comments:         Anesthesia Quick Evaluation

## 2023-08-10 NOTE — Op Note (Signed)
 Operative Note  Date of operation: 08/10/2023 Preoperative diagnosis: Left knee primary osteoarthritis Postoperative diagnosis: Same  Procedure: Left press-fit total knee arthroplasty  Implants: Biomet/Zimmer persona press-fit knee system Implant Name Type Inv. Item Serial No. Manufacturer Lot No. LRB No. Used Action  STEM ARTISURF EF 12 SZ8-11 - [id number] Stem STEM ARTISURF EF 12 SZ8-11  [employer](ORTH,TRAU,BIO,SG) [id number] Left 1 Implanted  COMP PATELLA PEG 3 32 - [id number] Joint COMP PATELLA PEG 3 32  [employer]IMMER [employer]ON(ORTH,TRAU,BIO,SG) [id number] Left 1 Implanted  COMP FEM PS KNEE STD 10 LT - [id number] Knees COMP FEM PS KNEE STD 10 LT  ZIMMER [employer]ON(ORTH,TRAU,BIO,SG) [id number] Left 1 Implanted  COMP TIB KNEE PS 0D LT - [id number] Joint COMP TIB KNEE PS 0D LT  ZIMMER [employer]ON(ORTH,TRAU,BIO,SG) [id number] Left 1 Implanted   Surgeon: Vanita Panda. Magnus Ivan, MD Assistant: Rexene Edison, PA-C  Anesthesia: #1 left lower extremity adductor canal block, #2 spinal, #3 local Tourniquet time: Under 1 hour EBL: Less than 100 cc Antibiotics: IV Ancef Complications: None  Indications: The patient is a 73 year old gentleman with severe debilitating arthritis involving his left knee.  This has been getting worse for several years now and he has tried and failed all forms of conservative treatment.  At this point his left knee pain is daily and it is detrimentally affecting his mobility, his quality of life and his activities day living enough to proceed with a total knee arthroplasty.  We agree with this as well based on his x-ray and clinical exam findings combined with the failure conservative treatment.  We did discuss the risk of acute blood loss anemia, nerve vessel injury, fracture, infection, DVT, implant failure and wound healing issues.  He understands that our goals are hopefully decreased pain, improved mobility and improved quality of life.  Procedure description: After informed consent was  obtained and the appropriate left knee was marked, anesthesia obtained a left lower extremity adductor canal block in the holding room.  The patient was then brought to the operating room and set up of the operative table where spinal anesthesia was obtained.  He was laid in the supine position on the operating table and a nonsterile tourniquet was placed around his left thigh.  Nursing did attempt to place a Foley catheter.  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  They did not force the the Foley in but he was it was definitely difficult trying to place so we decided to leave it out.  The left thigh, knee, leg and ankle were then prepped and draped with DuraPrep and sterile drapes and getting a sterile stockinette.  A timeout was called and he was identified as the correct patient and the correct left knee.  An Esmarch was then used to wrap out the leg and the tourniquet was plated 300 mm of pressure.  With the knee extended we made a direct midline incision over the patella and carried proximally distally.  We dissected down to the knee joint and carried out a medial parapatellar arthrotomy.  There was a very large joint effusion encountered we found complete the cartilage loss in the medial compartment of his knee especially the weightbearing surface.  We removed remnants of the ACL as well as medial lateral meniscus.  With the knee in a flexed position we used an extramedullary cutting guide for making her proximal tibia cut.  We made our cut to take 2 mm off the low side and correctable varus and valgus and a  7 degree slope.  Once made this cut we did back down to more millimeters.  We then used a intramedullary based cutting guide for distal femur cut setting this for a left knee at 5 degrees actually rotated and a 10 mm distal femoral cut.  We made this cut without difficulty and brought the knee back down to full extension with a 10 mm extension block we are  pleased with extension.  We then went back to the femur and put a femoral sizing guide based off the epicondylar axis.  Based off of this we chose a size 10 femur.  We put a 4-in-1 cutting block for a size 10 femur and made our anterior and posterior cuts followed by her chamfer cuts.  We then laid back to the tibia and placed a size F left tibial tray for coverage over the tibial plateau segment rotation off of the tibial tubercle and the femur.  We did our drill hole and keel punch off of this and found excellent quality bone for press-fit implants.  We then trialed our size F left tibia combined with our size 10 left CR standard femur.  We placed a 10 mm left medial congruent polythene insert and went to a 12 mm thickness insert we are pleased with range of motion and stability without 12 thickness insert.  We then made our patella cut and drilled 3 holes for a size 32 patella button.  Again with all trial instrumentation the knee we are pleased the range of motion and stability.  We then removed all trial rotation of the knee and irrigated near normal saline solution using pulsatile lavage.  We then placed Marcaine with epinephrine around the arthrotomy.  Next with the knee in a flexed position we placed our Biomet/Zimmer persona tibial tray for a left knee size F followed by placing our size 10 left CR standard press-fit femur.  We placed our 12 mm thickness left medial congruent polythene insert and press-fit our size 32 patella button.  We then noted the tourniquet down and hemostasis was obtained with electrocautery.  The arthrotomy was closed with interrupted #1 Vicryl suture followed by 0 Vicryl goes deep tissue and 2-0 Vicryl close subcutaneous tissue.  The skin was closed with staples.  Well-padded sterile dressing was applied.  The patient was taken recovery room.  Rexene Edison, PA-C did assist in interrogation beginning to end and his assistance was crucial and medically necessary for soft tissue  management and retraction, helping guide implant placement and a layered closure of the wound.

## 2023-08-10 NOTE — Plan of Care (Signed)
  Problem: Education: Goal: Knowledge of the prescribed therapeutic regimen will improve Outcome: Progressing   Problem: Activity: Goal: Ability to avoid complications of mobility impairment will improve Outcome: Progressing   Problem: Pain Management: Goal: Pain level will decrease with appropriate interventions Outcome: Progressing   Problem: Activity: Goal: Ability to avoid complications of mobility impairment will improve Outcome: Progressing   Problem: Pain Management: Goal: Pain level will decrease with appropriate interventions Outcome: Progressing

## 2023-08-10 NOTE — Anesthesia Procedure Notes (Addendum)
 Anesthesia Regional Block: Adductor canal block   Pre-Anesthetic Checklist: , timeout performed,  Correct Patient, Correct Site, Correct Laterality,  Correct Procedure, Correct Position, site marked,  Risks and benefits discussed,  Surgical consent,  Pre-op evaluation,  At surgeon's request and post-op pain management  Laterality: Left  Prep: Maximum Sterile Barrier Precautions used, chloraprep       Needles:  Injection technique: Single-shot  Needle Type: Echogenic Stimulator Needle     Needle Length: 9cm  Needle Gauge: 22     Additional Needles:   Procedures:,,,, ultrasound used (permanent image in chart),,    Narrative:  Start time: 08/10/2023 1:45 PM End time: 08/10/2023 1:50 PM Injection made incrementally with aspirations every 5 mL.  Performed by: Personally  Anesthesiologist: Lannie Fields, DO  Additional Notes: Monitors applied. No increased pain on injection. No increased resistance to injection. Injection made in 5cc increments. Good needle visualization. Patient tolerated procedure well.

## 2023-08-10 NOTE — Anesthesia Postprocedure Evaluation (Signed)
 Anesthesia Post Note  Patient: Luis Knapp  Procedure(s) Performed: ARTHROPLASTY, KNEE, TOTAL (Left: Knee)     Patient location during evaluation: PACU Anesthesia Type: MAC and Spinal Level of consciousness: awake and alert and oriented Pain management: pain level controlled Vital Signs Assessment: post-procedure vital signs reviewed and stable Respiratory status: spontaneous breathing, nonlabored ventilation and respiratory function stable Cardiovascular status: blood pressure returned to baseline and stable Postop Assessment: no headache, no backache and spinal receding Anesthetic complications: no   No notable events documented.  Last Vitals:  Vitals:   08/10/23 1630 08/10/23 1632  BP: (!) 156/77 (!) 156/77  Pulse: 81 76  Resp: 17 14  Temp:    SpO2: 93% 94%    Last Pain:  Vitals:   08/10/23 1642  TempSrc:   PainSc: 4     LLE Motor Response: Purposeful movement (08/10/23 1630) LLE Sensation: Decreased;Numbness (08/10/23 1630) RLE Motor Response: Purposeful movement (08/10/23 1630) RLE Sensation: Decreased;Numbness (08/10/23 1630) L Sensory Level: L5-Outer lower leg, top of foot, great toe (08/10/23 1630) R Sensory Level: L5-Outer lower leg, top of foot, great toe (08/10/23 1630)  Lannie Fields

## 2023-08-11 LAB — GLUCOSE, CAPILLARY: Glucose-Capillary: 103 mg/dL — ABNORMAL HIGH (ref 70–99)

## 2023-08-11 LAB — BASIC METABOLIC PANEL WITH GFR
Anion gap: 13 (ref 5–15)
BUN: 20 mg/dL (ref 8–23)
CO2: 21 mmol/L — ABNORMAL LOW (ref 22–32)
Calcium: 8.2 mg/dL — ABNORMAL LOW (ref 8.9–10.3)
Chloride: 97 mmol/L — ABNORMAL LOW (ref 98–111)
Creatinine, Ser: 0.96 mg/dL (ref 0.61–1.24)
GFR, Estimated: >60 mL/min (ref >60)
Glucose, Bld: 184 mg/dL — ABNORMAL HIGH (ref 70–99)
Potassium: 3.7 mmol/L (ref 3.5–5.1)
Sodium: 131 mmol/L — ABNORMAL LOW (ref 135–145)

## 2023-08-11 LAB — CBC
HCT: 34.4 % — ABNORMAL LOW (ref 39.0–52.0)
Hemoglobin: 12.1 g/dL — ABNORMAL LOW (ref 13.0–17.0)
MCH: 31.3 pg (ref 26.0–34.0)
MCHC: 35.2 g/dL (ref 30.0–36.0)
MCV: 88.9 fL (ref 80.0–100.0)
Platelets: 218 10*3/uL (ref 150–400)
RBC: 3.87 MIL/uL — ABNORMAL LOW (ref 4.22–5.81)
RDW: 13.2 % (ref 11.5–15.5)
WBC: 15.5 10*3/uL — ABNORMAL HIGH (ref 4.0–10.5)
nRBC: 0 % (ref 0.0–0.2)

## 2023-08-11 MED ORDER — TIZANIDINE HCL 2 MG PO TABS: 2.0000 mg | ORAL_TABLET | Freq: Four times a day (QID) | ORAL | 1 refills | Status: DC | PRN

## 2023-08-11 MED ORDER — ASPIRIN 81 MG PO CHEW
81.0000 mg | CHEWABLE_TABLET | Freq: Two times a day (BID) | ORAL | 0 refills | Status: DC
Start: 2023-08-11 — End: 2023-08-28

## 2023-08-11 MED ORDER — OXYCODONE HCL 5 MG PO TABS
5.0000 mg | ORAL_TABLET | Freq: Four times a day (QID) | ORAL | 0 refills | Status: DC | PRN
Start: 2023-08-11 — End: 2023-08-28

## 2023-08-11 NOTE — Progress Notes (Signed)
 Subjective: 1 Day Post-Op Procedure(s) (LRB): ARTHROPLASTY, KNEE, TOTAL (Left) Patient reports pain as moderate.  Surgery was done later yesterday, so has not had therapy.  Objective: Vital signs in last 24 hours: Temp:  [96 F (35.6 C)-98.8 F (37.1 C)] 97.7 F (36.5 C) (03/29 0947) Pulse Rate:  [72-90] 84 (03/29 0947) Resp:  [10-19] 16 (03/29 0947) BP: (123-165)/(54-97) 157/80 (03/29 0947) SpO2:  [91 %-100 %] 95 % (03/29 0947) Weight:  [106.1 kg] 106.1 kg (03/28 1258)  Intake/Output from previous day: 03/28 0701 - 03/29 0700 In: 2552.2 [P.O.:960; I.V.:1341.1; IV Piggyback:251.1] Out: 850 [Urine:800; Blood:50] Intake/Output this shift: Total I/O In: 200 [P.O.:200] Out: -   Recent Labs    08/11/23 0351  HGB 12.1*   Recent Labs    08/11/23 0351  WBC 15.5*  RBC 3.87*  HCT 34.4*  PLT 218   Recent Labs    08/11/23 0351  NA 131*  K 3.7  CL 97*  CO2 21*  BUN 20  CREATININE 0.96  GLUCOSE 184*  CALCIUM 8.2*   No results for input(s): "LABPT", "INR" in the last 72 hours.  Sensation intact distally Intact pulses distally Dorsiflexion/Plantar flexion intact Incision: moderate drainage Compartment soft   Assessment/Plan: 1 Day Post-Op Procedure(s) (LRB): ARTHROPLASTY, KNEE, TOTAL (Left) Up with therapy Plan for discharge tomorrow Discharge home with home health if clears therapy.      Kathryne Hitch 08/11/2023, 10:29 AM

## 2023-08-11 NOTE — Discharge Instructions (Signed)

## 2023-08-11 NOTE — Care Management Obs Status (Signed)
 MEDICARE OBSERVATION STATUS NOTIFICATION   Patient Details  Name: Luis Knapp MRN: [id number] Date of Birth: 1950/07/08   Medicare Observation Status Notification Given:  Yes    Howell Rucks, RN 08/11/2023, 12:03 PM

## 2023-08-11 NOTE — Progress Notes (Signed)
 Physical Therapy Treatment Patient Details Name: Luis Knapp MRN: [id number] DOB: [birth year] Today's Date: 08/11/2023   History of Present Illness Pt s/p L TKR and with hx of prostate CA    PT Comments  Pt s/p L TKR and presents with decreased L LE strength/ROM and post op pain limiting functional mobility.  Pt should progress to dc home with family assist.    If plan is discharge home, recommend the following: A little help with walking and/or transfers;A little help with bathing/dressing/bathroom;Assistance with cooking/housework;Assist for transportation;Help with stairs or ramp for entrance   Can travel by private vehicle        Equipment Recommendations  Other (comment) (Wife states they have ordered RW for delivery tomorrow.)    Recommendations for Other Services       Precautions / Restrictions Precautions Precautions: Fall;Knee Required Braces or Orthoses: Knee Immobilizer - Left Knee Immobilizer - Left: Discontinue once straight leg raise with < 10 degree lag Restrictions Weight Bearing Restrictions Per Provider Order: No Other Position/Activity Restrictions: WBAT     Mobility  Bed Mobility Overal bed mobility: Needs Assistance Bed Mobility: Supine to Sit     Supine to sit: Min assist     General bed mobility comments: Increased time with cues for sequence and use of R LE to self assist    Transfers Overall transfer level: Needs assistance Equipment used: Rolling walker (2 wheels) Transfers: Sit to/from Stand Sit to Stand: Min assist           General transfer comment: cues for LE management and use of UEs to self assist    Ambulation/Gait Ambulation/Gait assistance: Min assist Gait Distance (Feet): 28 Feet Assistive device: Rolling walker (2 wheels) Gait Pattern/deviations: Step-to pattern, Decreased step length - right, Decreased step length - left, Shuffle, Trunk flexed Gait velocity: decr     General Gait Details: cues for sequence,  posture and position from RW; distance ltd by pain   Stairs             Wheelchair Mobility     Tilt Bed    Modified Rankin (Stroke Patients Only)       Balance Overall balance assessment: Needs assistance Sitting-balance support: No upper extremity supported, Feet supported Sitting balance-Leahy Scale: Good     Standing balance support: Bilateral upper extremity supported Standing balance-Leahy Scale: Poor                              Communication Communication Communication: No apparent difficulties  Cognition Arousal: Alert Behavior During Therapy: WFL for tasks assessed/performed   PT - Cognitive impairments: No apparent impairments                         Following commands: Intact      Cueing Cueing Techniques: Verbal cues, Gestural cues  Exercises Total Joint Exercises Ankle Circles/Pumps: AROM, Both, 15 reps, Supine Quad Sets: AROM, Both, 10 reps, Supine Heel Slides: AAROM, Left, 15 reps, Supine Straight Leg Raises: AAROM, Left, 10 reps, Supine    General Comments        Pertinent Vitals/Pain Pain Assessment Pain Assessment: 0-10 Pain Score: 7  Pain Location: L knee Pain Descriptors / Indicators: Aching, Sore Pain Intervention(s): Limited activity within patient's tolerance, Monitored during session, Premedicated before session, Ice applied    Home Living Family/patient expects to be discharged to:: Private residence Living Arrangements: Spouse/significant other Available Help at  Discharge: Family;Available PRN/intermittently Type of Home: House Home Access: Stairs to enter Entrance Stairs-Rails: None Entrance Stairs-Number of Steps: 2+1   Home Layout: One level Home Equipment: Production assistant, radio - single point Additional Comments: Has ordered RW for delivery by tomorrow    Prior Function            PT Goals (current goals can now be found in the care plan section) Acute Rehab PT Goals Patient Stated Goal:  Regain IND PT Goal Formulation: With patient Time For Goal Achievement: 08/16/23 Potential to Achieve Goals: Good    Frequency    7X/week      PT Plan      Co-evaluation              AM-PAC PT "6 Clicks" Mobility   Outcome Measure  Help needed turning from your back to your side while in a flat bed without using bedrails?: A Little Help needed moving from lying on your back to sitting on the side of a flat bed without using bedrails?: A Little Help needed moving to and from a bed to a chair (including a wheelchair)?: A Little Help needed standing up from a chair using your arms (e.g., wheelchair or bedside chair)?: A Little Help needed to walk in hospital room?: A Little Help needed climbing 3-5 steps with a railing? : A Lot 6 Click Score: 17    End of Session Equipment Utilized During Treatment: Gait belt Activity Tolerance: Patient tolerated treatment well Patient left: in chair;with call bell/phone within reach;with chair alarm set;with family/visitor present Nurse Communication: Mobility status PT Visit Diagnosis: Unsteadiness on feet (R26.81);Difficulty in walking, not elsewhere classified (R26.2)     Time: 5784-6962 PT Time Calculation (min) (ACUTE ONLY): 34 min  Charges:    $Therapeutic Exercise: 8-22 mins PT General Charges $$ ACUTE PT VISIT: 1 Visit                     Mauro Kaufmann PT Acute Rehabilitation Services Pager [phone] Office [phone]    [facility] 08/11/2023, 12:43 PM

## 2023-08-11 NOTE — Progress Notes (Signed)
 Patient's wife, Lawernce Earll, is requested to speak directly to the on call Paged [employer].

## 2023-08-11 NOTE — Progress Notes (Signed)
 PT Cancellation Note  Patient Details Name: Luis Knapp MRN: [id number] DOB: 1950-06-09   Cancelled Treatment:     PT pm session attempted x 2 - initially deferred with pt requesting additional pain meds/muscle relaxer and on second attempt pt just back from bathroom with nursing and requests to rest and regroup in the am.  Will follow.   Nyiesha Beever 08/11/2023, 4:13 PM

## 2023-08-11 NOTE — TOC Transition Note (Signed)
 Transition of Care [facility]) - Discharge Note   Patient Details  Name: Luis Knapp MRN: [id number] Date of Birth: 1951/01/23  [facility] CM/SW Contact:  Howell Rucks, RN Phone Number: 08/11/2023, 12:48 PM   Clinical Narrative:   Met with pt at bedside to review dc therapy and home equipment needs, pt confirmed HH PT with [facility], no home DME needs. MOON completed. No TOC needs.     Final next level of care: Home w Home Health Services     Patient Goals and CMS Choice Patient states their goals for this hospitalization and ongoing recovery are:: return home          Discharge Placement                       Discharge Plan and Services Additional resources added to the After Visit Summary for                                       Social Drivers of Health (SDOH) Interventions SDOH Screenings   Food Insecurity: No Food Insecurity (08/10/2023)  Housing: Unknown (08/10/2023)  Transportation Needs: No Transportation Needs (08/10/2023)  Utilities: Not At Risk (08/10/2023)  Alcohol Screen: Low Risk  (06/11/2023)  Depression (PHQ2-9): Low Risk  (06/11/2023)  Financial Resource Strain: Low Risk  (03/27/2023)  Physical Activity: Sufficiently Active (03/27/2023)  Social Connections: Moderately Isolated (08/10/2023)  Stress: No Stress Concern Present (03/27/2023)  Tobacco Use: Medium Risk (08/10/2023)     Readmission Risk Interventions     No data to display

## 2023-08-11 NOTE — Progress Notes (Signed)
 Per Dr. Steward Drone, patient to be assessed by MD/PA in the morning.

## 2023-08-11 NOTE — Plan of Care (Signed)

## 2023-08-11 NOTE — Progress Notes (Signed)
 Patient is confused and restless, oriented to self but unable to state where he is and why. The patient's family is concerned for a UTI or possibly a blood clot. The patient's wife stated he has had pain on urination and frequency of urination. Vitals were obtained and blood sugar was 102.The patient's wife also expressed concern that patient's appetite is down, and stated he has only eaten 3 cups of applesauce all day. The patient's wife also stated that the patient's temperature is usually 97.9 at home.Paged [employer].

## 2023-08-12 DIAGNOSIS — M1712 Unilateral primary osteoarthritis, left knee: Secondary | ICD-10-CM | POA: Diagnosis present

## 2023-08-12 DIAGNOSIS — Z79899 Other long term (current) drug therapy: Secondary | ICD-10-CM | POA: Diagnosis not present

## 2023-08-12 DIAGNOSIS — Z8546 Personal history of malignant neoplasm of prostate: Secondary | ICD-10-CM | POA: Diagnosis not present

## 2023-08-12 DIAGNOSIS — M1A9XX0 Chronic gout, unspecified, without tophus (tophi): Secondary | ICD-10-CM | POA: Diagnosis present

## 2023-08-12 DIAGNOSIS — Z800 Family history of malignant neoplasm of digestive organs: Secondary | ICD-10-CM | POA: Diagnosis not present

## 2023-08-12 DIAGNOSIS — Z807 Family history of other malignant neoplasms of lymphoid, hematopoietic and related tissues: Secondary | ICD-10-CM | POA: Diagnosis not present

## 2023-08-12 DIAGNOSIS — F05 Delirium due to known physiological condition: Secondary | ICD-10-CM | POA: Diagnosis not present

## 2023-08-12 DIAGNOSIS — Z841 Family history of disorders of kidney and ureter: Secondary | ICD-10-CM | POA: Diagnosis not present

## 2023-08-12 DIAGNOSIS — Z6837 Body mass index (BMI) 37.0-37.9, adult: Secondary | ICD-10-CM | POA: Diagnosis not present

## 2023-08-12 DIAGNOSIS — Z87891 Personal history of nicotine dependence: Secondary | ICD-10-CM | POA: Diagnosis not present

## 2023-08-12 DIAGNOSIS — Z8249 Family history of ischemic heart disease and other diseases of the circulatory system: Secondary | ICD-10-CM | POA: Diagnosis not present

## 2023-08-12 DIAGNOSIS — E782 Mixed hyperlipidemia: Secondary | ICD-10-CM | POA: Diagnosis present

## 2023-08-12 DIAGNOSIS — Z833 Family history of diabetes mellitus: Secondary | ICD-10-CM | POA: Diagnosis not present

## 2023-08-12 DIAGNOSIS — I10 Essential (primary) hypertension: Secondary | ICD-10-CM | POA: Diagnosis present

## 2023-08-12 DIAGNOSIS — E669 Obesity, unspecified: Secondary | ICD-10-CM | POA: Diagnosis present

## 2023-08-12 MED ORDER — HALOPERIDOL LACTATE 5 MG/ML IJ SOLN
5.0000 mg | Freq: Four times a day (QID) | INTRAMUSCULAR | Status: DC | PRN
  Administered 2023-08-12: 5 mg via INTRAVENOUS
  Filled 2023-08-12: qty 1

## 2023-08-12 MED ORDER — HALOPERIDOL LACTATE 5 MG/ML IJ SOLN
INTRAMUSCULAR | Status: AC
  Administered 2023-08-12: 5 mg
  Filled 2023-08-12: qty 1

## 2023-08-12 MED ORDER — HALOPERIDOL LACTATE 5 MG/ML IJ SOLN: 5.0000 mg | Freq: Once | INTRAMUSCULAR | Status: DC

## 2023-08-12 NOTE — Progress Notes (Signed)
 Physical Therapy Treatment Patient Details Name: Luis Knapp MRN: [id number] DOB: [birth year] Today's Date: 08/12/2023   History of Present Illness Pt s/p L TKR and with hx of prostate CA    PT Comments  Pt cooperative but impulsive and with flat affect this pm.  Pt performed therex program with assist and up to ambulate 100' with RW requiring frequent cues for position from RW and safety awareness.  Spouse pleased with progress.    If plan is discharge home, recommend the following: A little help with walking and/or transfers;A little help with bathing/dressing/bathroom;Assistance with cooking/housework;Assist for transportation;Help with stairs or ramp for entrance   Can travel by private vehicle        Equipment Recommendations  Other (comment) (pt has ordered RW online, delivery Sunday)    Recommendations for Other Services       Precautions / Restrictions Precautions Precautions: Fall;Knee Knee Immobilizer - Left: Discontinue once straight leg raise with < 10 degree lag (pt performed IND SLR this pm) Restrictions Weight Bearing Restrictions Per Provider Order: No Other Position/Activity Restrictions: WBAT     Mobility  Bed Mobility Overal bed mobility: Needs Assistance Bed Mobility: Supine to Sit     Supine to sit: Min assist     General bed mobility comments: Increased time with cues for sequence and use of R LE to self assist    Transfers Overall transfer level: Needs assistance Equipment used: Rolling walker (2 wheels) Transfers: Sit to/from Stand Sit to Stand: Min assist, Contact guard assist           General transfer comment: cues for LE management and use of UEs to self assist    Ambulation/Gait Ambulation/Gait assistance: Min assist Gait Distance (Feet): 100 Feet Assistive device: Rolling walker (2 wheels) Gait Pattern/deviations: Step-to pattern, Decreased step length - right, Decreased step length - left, Shuffle, Trunk flexed Gait  velocity: decr     General Gait Details: cues for sequence, posture, saftey awareness and position from [employer]             Wheelchair Mobility     Tilt Bed    Modified Rankin (Stroke Patients Only)       Balance Overall balance assessment: Needs assistance Sitting-balance support: No upper extremity supported, Feet supported Sitting balance-Leahy Scale: Good     Standing balance support: Single extremity supported Standing balance-Leahy Scale: Poor                              Communication Communication Communication: No apparent difficulties  Cognition Arousal: Alert Behavior During Therapy: Flat affect, Impulsive   PT - Cognitive impairments: Problem solving, Safety/Judgement                       PT - Cognition Comments: Pt very confused overnight - improvement noted but not yet at baseline Following commands: Intact      Cueing Cueing Techniques: Verbal cues, Gestural cues  Exercises Total Joint Exercises Ankle Circles/Pumps: AROM, Both, 15 reps, Supine Quad Sets: AROM, Both, 10 reps, Supine Heel Slides: AAROM, Left, 15 reps, Supine Straight Leg Raises: Left, 10 reps, Supine, AAROM, AROM Goniometric ROM: AAROM L knee -5 - 45    General Comments        Pertinent Vitals/Pain Pain Assessment Pain Assessment: 0-10 Pain Score: 4  Pain Location: L knee Pain Descriptors / Indicators: Aching, Sore Pain Intervention(s): Limited activity within patient's  tolerance, Monitored during session (ice declined)    Home Living                          Prior Function            PT Goals (current goals can now be found in the care plan section) Acute Rehab PT Goals Patient Stated Goal: Regain IND PT Goal Formulation: With patient Time For Goal Achievement: 08/16/23 Potential to Achieve Goals: Good Progress towards PT goals: Progressing toward goals    Frequency    7X/week      PT Plan       Co-evaluation              AM-PAC PT "6 Clicks" Mobility   Outcome Measure  Help needed turning from your back to your side while in a flat bed without using bedrails?: A Little Help needed moving from lying on your back to sitting on the side of a flat bed without using bedrails?: A Little Help needed moving to and from a bed to a chair (including a wheelchair)?: A Little Help needed standing up from a chair using your arms (e.g., wheelchair or bedside chair)?: A Little Help needed to walk in hospital room?: A Little Help needed climbing 3-5 steps with a railing? : A Lot 6 Click Score: 17    End of Session Equipment Utilized During Treatment: Gait belt Activity Tolerance: Patient tolerated treatment well Patient left: in chair;with call bell/phone within reach;with chair alarm set;with family/visitor present Nurse Communication: Mobility status PT Visit Diagnosis: Unsteadiness on feet (R26.81);Difficulty in walking, not elsewhere classified (R26.2)     Time: 8413-2440 PT Time Calculation (min) (ACUTE ONLY): 31 min  Charges:    $Gait Training: 8-22 mins $Therapeutic Exercise: 8-22 mins PT General Charges $$ ACUTE PT VISIT: 1 Visit                     Luis Knapp PT Acute Rehabilitation Services Pager [phone] Office [phone]    Luis Knapp 08/12/2023, 3:28 PM

## 2023-08-12 NOTE — Progress Notes (Signed)
 Subjective: 2 Days Post-Op Procedure(s) (LRB): ARTHROPLASTY, KNEE, TOTAL (Left) Patient reports pain as moderate.  Having some postoperative delirium with disorientation although nothing is focal.  He has been quite mobile and trying to get out of bed. Objective: Vital signs in last 24 hours: Temp:  [97.7 F (36.5 C)-98.6 F (37 C)] 98.2 F (36.8 C) (03/30 0620) Pulse Rate:  [78-98] 98 (03/30 0620) Resp:  [16-20] 17 (03/30 0620) BP: (152-177)/(64-82) 177/78 (03/30 0620) SpO2:  [94 %-97 %] 97 % (03/30 0620)  Intake/Output from previous day: 03/29 0701 - 03/30 0700 In: 280 [P.O.:280] Out: 200 [Urine:200] Intake/Output this shift: No intake/output data recorded.  Recent Labs    08/11/23 0351  HGB 12.1*   Recent Labs    08/11/23 0351  WBC 15.5*  RBC 3.87*  HCT 34.4*  PLT 218   Recent Labs    08/11/23 0351  NA 131*  K 3.7  CL 97*  CO2 21*  BUN 20  CREATININE 0.96  GLUCOSE 184*  CALCIUM 8.2*   No results for input(s): "LABPT", "INR" in the last 72 hours.  Sensation intact distally Intact pulses distally Dorsiflexion/Plantar flexion intact Incision: moderate drainage Compartment soft   Assessment/Plan: 2 Days Post-Op Procedure(s) (LRB): ARTHROPLASTY, KNEE, TOTAL (Left) Will plan for dose of Haldol given his delirium and lack of sleep Will plan to mobilize with physical therapy this afternoon if he is able to Plan for frequent reorientation and assessments Likely need continued stay in the hospital given his delirium     Luis Knapp 08/12/2023, 7:56 AM

## 2023-08-12 NOTE — Progress Notes (Signed)
 PT Cancellation Note  Patient Details Name: Luis Knapp MRN: [id number] DOB: May 24, 1950   Cancelled Treatment:     PT deferred this am at request of RN 2* pt confusion and very difficult night.  Will follow.   Caven Perine 08/12/2023, 12:44 PM

## 2023-08-12 NOTE — Plan of Care (Signed)

## 2023-08-12 NOTE — Progress Notes (Signed)
 The patient was confused and attempting to get out of bed most of the night. Patient currently sleeping, wife at bedside `.

## 2023-08-13 ENCOUNTER — Encounter (HOSPITAL_COMMUNITY): Payer: Self-pay | Admitting: Orthopaedic Surgery

## 2023-08-13 NOTE — Progress Notes (Signed)
 Patient discharged to home w/ wife and son. Patient and family were very eager to d/c, declined any am medications, states will take at home. Also declined any further PT sessions. RN visualized patient ambulation. Patient and family verbalized understanding of all instructions. Escorted to pov via w/c w/ all belongings, instructions and equipment.

## 2023-08-13 NOTE — Discharge Summary (Signed)
 Patient ID: Luis Knapp MRN: [id number] DOB/AGE: 1950/08/14 73 y.o.  Admit date: 08/10/2023 Discharge date: 08/13/2023  Admission Diagnoses:  Principal Problem:   Unilateral primary osteoarthritis, left knee Active Problems:   Status post total left knee replacement   Discharge Diagnoses:  Same  Past Medical History:  Diagnosis Date   Allergic rhinitis due to pollen    Allergy    Arthritis    Constipation 2018   Diverticulosis of colon (without mention of hemorrhage)    ED (erectile dysfunction)    Elevated PSA    GERD (gastroesophageal reflux disease)    History of colon polyps    2008--  PRE CANEROUS/   2013-- ADENOMA   Low back pain 2018   Muscle spasm 2018   Muscle weakness 2018   Post-void dribbling 2018   Prostate cancer (HCC) 11/01/2016   prostate cancer   [name]    Stress incontinence 2018   Unspecified essential hypertension    Urinary retention 2018   Wears glasses     Surgeries: Procedure(s): ARTHROPLASTY, KNEE, TOTAL on 08/10/2023   Consultants:   Discharged Condition: Improved  Hospital Course: Heber Hoog is an 73 y.o. male who was admitted 08/10/2023 for operative treatment ofUnilateral primary osteoarthritis, left knee. Patient has severe unremitting pain that affects sleep, daily activities, and work/hobbies. After pre-op clearance the patient was taken to the operating room on 08/10/2023 and underwent  Procedure(s): ARTHROPLASTY, KNEE, TOTAL.    Patient was given perioperative antibiotics:  Anti-infectives (From admission, onward)    Start     Dose/Rate Route Frequency Ordered Stop   08/10/23 2030  ceFAZolin (ANCEF) IVPB 2g/100 mL premix        2 g 200 mL/hr over 30 Minutes Intravenous Every 6 hours 08/10/23 1748 08/11/23 0342   08/10/23 1200  ceFAZolin (ANCEF) IVPB 2g/100 mL premix        2 g 200 mL/hr over 30 Minutes Intravenous On call to O.R. 08/10/23 1156 08/10/23 1423        Patient was given sequential  compression devices, early ambulation, and chemoprophylaxis to prevent DVT.  Patient benefited maximally from hospital stay and there were no complications.    Recent vital signs: Patient Vitals for the past 24 hrs:  BP Temp Temp src Pulse Resp SpO2  08/13/23 0640 (!) 146/78 98 F (36.7 C) Oral 89 17 98 %  08/12/23 2039 (!) 162/66 98.6 F (37 C) Oral 95 17 94 %  08/12/23 1345 (!) 162/67 98.2 F (36.8 C) Oral 92 18 96 %     Recent laboratory studies:  Recent Labs    08/11/23 0351  WBC 15.5*  HGB 12.1*  HCT 34.4*  PLT 218  NA 131*  K 3.7  CL 97*  CO2 21*  BUN 20  CREATININE 0.96  GLUCOSE 184*  CALCIUM 8.2*     Discharge Medications:   Allergies as of 08/13/2023   No Known Allergies      Medication List     TAKE these medications    allopurinol 300 MG tablet Commonly known as: ZYLOPRIM TAKE 1 TABLET BY MOUTH DAILY   amLODipine 5 MG tablet Commonly known as: NORVASC Take 1 tablet (5 mg total) by mouth at bedtime.   aspirin 81 MG chewable tablet Chew 1 tablet (81 mg total) by mouth 2 (two) times daily.   CoQ-10 100 MG Caps Take 100 mg by mouth 2 (two) times daily.   ibuprofen 200 MG tablet Commonly known as: ADVIL Take  200 mg by mouth every 6 (six) hours as needed for mild pain (pain score 1-3).   oxyCODONE 5 MG immediate release tablet Commonly known as: Oxy IR/ROXICODONE Take 1-2 tablets (5-10 mg total) by mouth every 6 (six) hours as needed for moderate pain (pain score 4-6) (pain score 4-6).   rosuvastatin 10 MG tablet Commonly known as: Crestor Take 1 tablet (10 mg total) by mouth daily.   tiZANidine 2 MG tablet Commonly known as: ZANAFLEX Take 1 tablet (2 mg total) by mouth every 6 (six) hours as needed for muscle spasms.   TURMERIC PO Take 538 mg by mouth daily.   valsartan-hydrochlorothiazide 320-25 MG tablet Commonly known as: DIOVAN-HCT Take 1 tablet by mouth daily.   Vitamin D-3 25 MCG (1000 UT) Caps Take 1,000 Units by mouth  daily.               Durable Medical Equipment  (From admission, onward)           Start     Ordered   08/10/23 1749  DME 3 n 1  Once        08/10/23 1748   08/10/23 1749  DME Walker rolling  Once       Question Answer Comment  Walker: With 5 Inch Wheels   Patient needs a walker to treat with the following condition Status post total left knee replacement      08/10/23 1748            Diagnostic Studies: DG Knee Left Port Result Date: 08/10/2023 CLINICAL DATA:  Status post left knee replacement. EXAM: PORTABLE LEFT KNEE - 1-2 VIEW COMPARISON:  None Available. FINDINGS: Left knee arthroplasty in expected alignment. No periprosthetic lucency or fracture. Recent postsurgical change includes air and edema in the soft tissues and joint space. Anterior skin staples in place. IMPRESSION: Left knee arthroplasty without immediate postoperative complication. Electronically Signed   By: Narda Rutherford M.D.   On: 08/10/2023 18:45    Disposition: Discharge disposition: 01-Home or Self Care          Follow-up Information     Kathryne Hitch, MD Follow up in 2 week(s).   Specialty: Orthopedic Surgery Contact information: [address] [place] [address] [phone]                  Signed: Kathryne Hitch 08/13/2023, 7:25 AM

## 2023-08-13 NOTE — Progress Notes (Signed)
 Patient ID: Luis Knapp, male   DOB: April 25, 1951, 73 y.o.   MRN: [id number] The patient is awake and alert with family at bedside.  His vital signs are stable.  His left knee is stable but I did change the dressing and the incision is clean and dry.  His calf is soft.  They really want to get him out of.  This morning given the threat for bad weather this afternoon.  He said he did not sleep at all last night and he would rather just be discharged.  We will put in orders for discharge this morning.

## 2023-08-14 ENCOUNTER — Other Ambulatory Visit: Payer: Self-pay | Admitting: *Deleted

## 2023-08-14 ENCOUNTER — Telehealth: Payer: Self-pay | Admitting: Orthopaedic Surgery

## 2023-08-14 DIAGNOSIS — N433 Hydrocele, unspecified: Secondary | ICD-10-CM | POA: Diagnosis not present

## 2023-08-14 DIAGNOSIS — Z471 Aftercare following joint replacement surgery: Secondary | ICD-10-CM | POA: Diagnosis not present

## 2023-08-14 DIAGNOSIS — K5730 Diverticulosis of large intestine without perforation or abscess without bleeding: Secondary | ICD-10-CM | POA: Diagnosis not present

## 2023-08-14 DIAGNOSIS — Z96652 Presence of left artificial knee joint: Secondary | ICD-10-CM | POA: Diagnosis not present

## 2023-08-14 DIAGNOSIS — C775 Secondary and unspecified malignant neoplasm of intrapelvic lymph nodes: Secondary | ICD-10-CM | POA: Diagnosis not present

## 2023-08-14 DIAGNOSIS — M1712 Unilateral primary osteoarthritis, left knee: Secondary | ICD-10-CM

## 2023-08-14 DIAGNOSIS — E782 Mixed hyperlipidemia: Secondary | ICD-10-CM | POA: Diagnosis not present

## 2023-08-14 DIAGNOSIS — Z79891 Long term (current) use of opiate analgesic: Secondary | ICD-10-CM | POA: Diagnosis not present

## 2023-08-14 DIAGNOSIS — Z7982 Long term (current) use of aspirin: Secondary | ICD-10-CM | POA: Diagnosis not present

## 2023-08-14 DIAGNOSIS — M5430 Sciatica, unspecified side: Secondary | ICD-10-CM | POA: Diagnosis not present

## 2023-08-14 DIAGNOSIS — Z791 Long term (current) use of non-steroidal anti-inflammatories (NSAID): Secondary | ICD-10-CM | POA: Diagnosis not present

## 2023-08-14 DIAGNOSIS — M1A9XX0 Chronic gout, unspecified, without tophus (tophi): Secondary | ICD-10-CM | POA: Diagnosis not present

## 2023-08-14 DIAGNOSIS — Z6837 Body mass index (BMI) 37.0-37.9, adult: Secondary | ICD-10-CM | POA: Diagnosis not present

## 2023-08-14 DIAGNOSIS — I10 Essential (primary) hypertension: Secondary | ICD-10-CM | POA: Diagnosis not present

## 2023-08-14 DIAGNOSIS — Z8546 Personal history of malignant neoplasm of prostate: Secondary | ICD-10-CM | POA: Diagnosis not present

## 2023-08-14 NOTE — Telephone Encounter (Signed)
 Verbal order given

## 2023-08-14 NOTE — Telephone Encounter (Signed)
 Luis Knapp from [facility] home health PT has verbal orders. 2x for 2wk for home health PT. [phone]

## 2023-08-16 DIAGNOSIS — Z471 Aftercare following joint replacement surgery: Secondary | ICD-10-CM | POA: Diagnosis not present

## 2023-08-16 DIAGNOSIS — K5730 Diverticulosis of large intestine without perforation or abscess without bleeding: Secondary | ICD-10-CM | POA: Diagnosis not present

## 2023-08-16 DIAGNOSIS — Z6837 Body mass index (BMI) 37.0-37.9, adult: Secondary | ICD-10-CM | POA: Diagnosis not present

## 2023-08-16 DIAGNOSIS — I10 Essential (primary) hypertension: Secondary | ICD-10-CM | POA: Diagnosis not present

## 2023-08-16 DIAGNOSIS — C775 Secondary and unspecified malignant neoplasm of intrapelvic lymph nodes: Secondary | ICD-10-CM | POA: Diagnosis not present

## 2023-08-17 ENCOUNTER — Telehealth: Payer: Self-pay | Admitting: Orthopaedic Surgery

## 2023-08-17 NOTE — Telephone Encounter (Signed)
 Pt and his wife called triage phone, and stated he has blood "gushing" from proximal and distal incision. No pus, and bleeding does stop after a while. This mainly happens after his exercises. I advised him okay to wash area with dial soap and water and she said she wasn't told to do that and she wouldn't do it. I then advised her to use gauze and ACE or coban wrap over the knee and leg for compression. She says she will do this.   Also asked if he should stop his baby ASA.

## 2023-08-20 DIAGNOSIS — Z471 Aftercare following joint replacement surgery: Secondary | ICD-10-CM | POA: Diagnosis not present

## 2023-08-20 DIAGNOSIS — I10 Essential (primary) hypertension: Secondary | ICD-10-CM | POA: Diagnosis not present

## 2023-08-20 DIAGNOSIS — K5730 Diverticulosis of large intestine without perforation or abscess without bleeding: Secondary | ICD-10-CM | POA: Diagnosis not present

## 2023-08-20 DIAGNOSIS — Z6837 Body mass index (BMI) 37.0-37.9, adult: Secondary | ICD-10-CM | POA: Diagnosis not present

## 2023-08-20 DIAGNOSIS — C775 Secondary and unspecified malignant neoplasm of intrapelvic lymph nodes: Secondary | ICD-10-CM | POA: Diagnosis not present

## 2023-08-21 DIAGNOSIS — Z6837 Body mass index (BMI) 37.0-37.9, adult: Secondary | ICD-10-CM | POA: Diagnosis not present

## 2023-08-21 DIAGNOSIS — K5730 Diverticulosis of large intestine without perforation or abscess without bleeding: Secondary | ICD-10-CM | POA: Diagnosis not present

## 2023-08-21 DIAGNOSIS — C775 Secondary and unspecified malignant neoplasm of intrapelvic lymph nodes: Secondary | ICD-10-CM | POA: Diagnosis not present

## 2023-08-21 DIAGNOSIS — I10 Essential (primary) hypertension: Secondary | ICD-10-CM | POA: Diagnosis not present

## 2023-08-21 DIAGNOSIS — Z471 Aftercare following joint replacement surgery: Secondary | ICD-10-CM | POA: Diagnosis not present

## 2023-08-22 DIAGNOSIS — C775 Secondary and unspecified malignant neoplasm of intrapelvic lymph nodes: Secondary | ICD-10-CM | POA: Diagnosis not present

## 2023-08-22 DIAGNOSIS — K5730 Diverticulosis of large intestine without perforation or abscess without bleeding: Secondary | ICD-10-CM | POA: Diagnosis not present

## 2023-08-22 DIAGNOSIS — I10 Essential (primary) hypertension: Secondary | ICD-10-CM | POA: Diagnosis not present

## 2023-08-22 DIAGNOSIS — Z6837 Body mass index (BMI) 37.0-37.9, adult: Secondary | ICD-10-CM | POA: Diagnosis not present

## 2023-08-22 DIAGNOSIS — Z471 Aftercare following joint replacement surgery: Secondary | ICD-10-CM | POA: Diagnosis not present

## 2023-08-23 ENCOUNTER — Ambulatory Visit (INDEPENDENT_AMBULATORY_CARE_PROVIDER_SITE_OTHER): Payer: Medicare Other | Admitting: Orthopaedic Surgery

## 2023-08-23 DIAGNOSIS — Z96652 Presence of left artificial knee joint: Secondary | ICD-10-CM

## 2023-08-23 NOTE — Progress Notes (Signed)
 The patient is a 73 year old gentleman who is here for his first visit status post a left total knee replacement 2 weeks ago.  He is exhausted not sleeping well but he is exceeded our expectations in terms of back that he is already bending his knee well and straining a well and already just taking Tylenol for pain.  He is scheduled to start outpatient physical therapy tomorrow.  His left knee incision looks good.  Staples are removed and stretches applied.  He does have some significant bruising all throughout his thigh down to his knee.  His foot and ankle show minimal swelling.  He is able to extend his ankle and foot and his calf is soft.  I was able to fully extend his left knee and flex it to just past 90 degrees.  It feels stable.  He feels better overall in terms of the knee.  He will start outpatient physical therapy tomorrow.  Will see him back in a month to see how he is doing overall but no x-rays are needed.  He can stop his aspirin as well.

## 2023-08-24 ENCOUNTER — Encounter: Payer: Self-pay | Admitting: Physical Therapy

## 2023-08-24 ENCOUNTER — Other Ambulatory Visit: Payer: Self-pay | Admitting: Orthopaedic Surgery

## 2023-08-24 ENCOUNTER — Other Ambulatory Visit: Payer: Self-pay

## 2023-08-24 ENCOUNTER — Ambulatory Visit: Admitting: Physical Therapy

## 2023-08-24 DIAGNOSIS — R262 Difficulty in walking, not elsewhere classified: Secondary | ICD-10-CM | POA: Diagnosis not present

## 2023-08-24 DIAGNOSIS — M25662 Stiffness of left knee, not elsewhere classified: Secondary | ICD-10-CM | POA: Diagnosis not present

## 2023-08-24 DIAGNOSIS — R600 Localized edema: Secondary | ICD-10-CM

## 2023-08-24 DIAGNOSIS — M25562 Pain in left knee: Secondary | ICD-10-CM

## 2023-08-24 DIAGNOSIS — R2681 Unsteadiness on feet: Secondary | ICD-10-CM

## 2023-08-24 NOTE — Therapy (Signed)
 OUTPATIENT PHYSICAL THERAPY LOWER EXTREMITY EVALUATION   Patient Name: Luis Knapp MRN: [id number] DOB:24-Nov-1950, 73 y.o., male Today's Date: 08/24/2023  END OF SESSION:  PT End of Session - 08/24/23 1200     Visit Number 1    Number of Visits 17    Date for PT Re-Evaluation 10/19/23    Authorization Type MCR    Authorization Time Period 08/24/23 to 10/19/23    PT Start Time 1059    PT Stop Time 1143    PT Time Calculation (min) 44 min    Activity Tolerance Patient tolerated treatment well    Behavior During Therapy Flat affect;Impulsive;Anxious             Past Medical History:  Diagnosis Date   Allergic rhinitis due to pollen    Allergy    Arthritis    Constipation 2018   Diverticulosis of colon (without mention of hemorrhage)    ED (erectile dysfunction)    Elevated PSA    GERD (gastroesophageal reflux disease)    History of colon polyps    2008--  PRE CANEROUS/   2013-- ADENOMA   Low back pain 2018   Muscle spasm 2018   Muscle weakness 2018   Post-void dribbling 2018   Prostate cancer (HCC) 11/01/2016   prostate cancer   [name]    Stress incontinence 2018   Unspecified essential hypertension    Urinary retention 2018   Wears glasses    Past Surgical History:  Procedure Laterality Date   COLONOSCOPY     COLONOSCOPY WITH PROPOFOL  10-30-2011  &  2008   POLYPECTOMY   HYDROCELE EXCISION Right 07/18/2013   Procedure: RIGHT HYDROCELECTOMY ADULT;  Surgeon: Crist Fat, MD;  Location: [facility];  Service: Urology;  Laterality: Right;   LYMPH NODE DISSECTION Bilateral 11/01/2016   Procedure: BILATERAL PELVIC LYMPH NODE DISSECTION;  Surgeon: Crist Fat, MD;  Location: [facility];  Service: Urology;  Laterality: Bilateral;   POLYPECTOMY     PROSTATE BIOPSY     ROBOT ASSISTED LAPAROSCOPIC RADICAL PROSTATECTOMY N/A 11/01/2016   Procedure: XI ROBOTIC ASSISTED LAPAROSCOPIC RADICAL PROSTATECTOMY;  Surgeon: Crist Fat, MD;  Location: [facility];  Service: Urology;  Laterality: N/A;   TOTAL KNEE ARTHROPLASTY Left 08/10/2023   Procedure: ARTHROPLASTY, KNEE, TOTAL;  Surgeon: Kathryne Hitch, MD;  Location: [facility];  Service: Orthopedics;  Laterality: Left;  LEFT TOTAL KNEE ARTHROPLASTY  SPINAL + BLOCK   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   Medicare annual wellness visit, initial 10/18/2015   Hydrocele, right 06/24/2013   Routine general medical examination at a health care facility 12/04/2010   Morbid obesity (HCC) 05/18/2010   SCIATICA 05/18/2010   POLYP, COLON 08/03/2007   Essential hypertension 08/03/2007   Diverticulosis of colon 08/03/2007    PCP: Olive Bass FNP   REFERRING PROVIDER: Kathryne Hitch, MD  REFERRING DIAG:  Diagnosis  [phone] (ICD-10-CM) - Status post total left knee replacement  M17.12 (ICD-10-CM) - Unilateral primary osteoarthritis, left knee    THERAPY DIAG:  Acute pain of left knee  Stiffness of left knee, not elsewhere classified  Localized edema  Difficulty in walking, not elsewhere classified  Unsteadiness on feet  Rationale for Evaluation  and Treatment: Rehabilitation  ONSET DATE: surgery 08/10/23  SUBJECTIVE:   SUBJECTIVE STATEMENT:  I have just been hanging on the edge barely able to do anything due to exhaustion. I have refused to eat a full meal so far but I was able to last night. Haven't slept well for the past 2 weeks. Not taking anything but Tylenol. Have not been serious in pain at all really. Had post-op delerium and had to take Haldol in the hospital. DCed from HHPT.   PERTINENT HISTORY: See above  PAIN:  Are you having pain? Yes: NPRS scale: 3/10 Pain  location: surgical knee  Pain description: post-op pain  Aggravating factors: everything especially if I push it too hard  Relieving factors: not to sit for too long   PRECAUTIONS: Fall  RED FLAGS: None   WEIGHT BEARING RESTRICTIONS: No  FALLS:  Has patient fallen in last 6 months? No  LIVING ENVIRONMENT: Lives with: lives with their spouse Lives in: House/apartment Stairs: 2 steps from carport into house, also has concrete ramp about 48ft (pt reports it is too steep) Has following equipment at home: Single point cane, Walker - 2 wheeled, Environmental consultant - 4 wheeled, and Ramped entry  OCCUPATION: retired   PLOF: Independent, Independent with basic ADLs, Independent with gait, and Independent with transfers  PATIENT GOALS: get back to PLOF- walking for exercise, be able to do floor transfers, build stamina, navigate steps and ramp safely   NEXT MD VISIT: May 12th with Dr. Magnus Ivan  OBJECTIVE:  Note: Objective measures were completed at Evaluation unless otherwise noted.  DIAGNOSTIC FINDINGS: Narrative & Impression CLINICAL DATA:  Status post left knee replacement.   EXAM: PORTABLE LEFT KNEE - 1-2 VIEW   COMPARISON:  None Available.   FINDINGS: Left knee arthroplasty in expected alignment. No periprosthetic lucency or fracture. Recent postsurgical change includes air and edema in the soft tissues and joint space. Anterior skin staples in place.   IMPRESSION: Left knee arthroplasty without immediate postoperative complication.     Electronically Signed   By: Narda Rutherford M.D.   On: 08/10/2023 18:45  Narrative & Impression CLINICAL DATA:  Prostate carcinoma with biochemical recurrence.   EXAM: NUCLEAR MEDICINE PET SKULL BASE TO THIGH   TECHNIQUE: 7.5 mCi Flotufolastat (Posluma) was injected intravenously. Full-ring PET imaging was performed from the skull base to thigh after the radiotracer. CT data was obtained and used for attenuation correction and anatomic  localization.   COMPARISON:  None Available.   FINDINGS: NECK   No radiotracer activity in neck lymph nodes.   Incidental CT finding: None.   CHEST   No radiotracer accumulation within mediastinal or hilar lymph nodes. No suspicious pulmonary nodules on the CT scan.   Incidental CT finding: None.   ABDOMEN/PELVIS   Prostate: No focal activity in the prostate bed.   Lymph nodes: There is a focus of radiotracer activity in the presacral space just RIGHT of midline with SUV max equal 4.9 (image 164). There is a subtle soft tissue density measuring approximately 4 mm at this site (image 165/series 4)   The second tiny focus radiotracer activity along the LEFT internal iliac artery in the deep pelvis on image 180 with SUV max equal 3.4. No clear CT correlation.   Liver: No evidence of liver metastasis.   Incidental CT finding: None.   SKELETON   No focal activity to suggest skeletal metastasis.   IMPRESSION: 1. Two tiny foci of radiotracer activity in the pelvis are concerning for  prostate cancer metastasis. 2. No evidence of local prostate cancer recurrence in the prostate bed. 3. No evidence of visceral metastasis or skeletal metastasis.  PATIENT SURVEYS:    Patient-Specific Activity Scoring Scheme  "0" represents "unable to perform." "10" represents "able to perform at prior level. 0 1 2 3 4 5 6 7 8 9  10 (Date and Score)   Activity Eval     1. Walking 1.5 miles for exercise   0    2. Getting up and down from the floor  0     3. Stamina  3   4.Steps and ramp navigation 3   5.    Score 1.5    Total score = sum of the activity scores/number of activities Minimum detectable change (90%CI) for average score = 2 points Minimum detectable change (90%CI) for single activity score = 3 points     COGNITION: Overall cognitive status: Impaired- wife present and reports he has not slept much at all for past 2 weeks, not at baseline. Baseline is A&Ox4 and no  cognitive impairments.        EDEMA:   Appropriate post-op status     LOWER EXTREMITY ROM:  Active ROM Left eval  Hip flexion   Hip extension   Hip abduction   Hip adduction   Hip internal rotation   Hip external rotation   Knee flexion 101* supine heel slide   Knee extension 7* supine quad set with heel prop   Ankle dorsiflexion   Ankle plantarflexion   Ankle inversion   Ankle eversion    (Blank rows = not tested)  LOWER EXTREMITY MMT:  MMT Left eval  Hip flexion 3+  Hip extension   Hip abduction   Hip adduction   Hip internal rotation   Hip external rotation   Knee flexion   Knee extension 4  Ankle dorsiflexion 5  Ankle plantarflexion   Ankle inversion   Ankle eversion    (Blank rows = not tested)    FUNCTIONAL TESTS:  5 times sit to stand: 47 seconds no UEs  Timed up and go (TUG): 33 seconds 4WW   GAIT: Distance walked: in clinic distances  Assistive device utilized: Environmental consultant - 4 wheeled Level of assistance: Modified independence Comments: slow gait pattern, intermittent heel toe, very slow pace of pain, some toe out B                                                                                                                                 TREATMENT DATE:   08/24/23  Eval, care planning, education on general recovery from TKR including continuing to work on hospital and HHPT HEPs, activity tolerance impairments following surgery, PT expectations and anticipated course of care, discussed concerns over lack of sleep with spouse and educated that we will monitor  and reach out to MD for further assistance managing this PRN    PATIENT EDUCATION:  Education  details: see above  Person educated: Patient and Spouse Education method: Explanation and Verbal cues Education comprehension: verbalized understanding, returned demonstration, and needs further education  HOME EXERCISE PROGRAM: Hospital and HHPT HEPs for now   ASSESSMENT:  CLINICAL  IMPRESSION:  Patient is a 73 y.o. M who was seen today for physical therapy evaluation and treatment for skilled PT care following TKR surgery on 08/10/23.  Extremely talkative and with multiple complaints this morning, eval was somewhat limited due to this but we did what we could today. Has been very sleep deprived which is likely significantly impacting cognition, had issues with hospital delirium as well- will continue to monitor closely.  OBJECTIVE IMPAIRMENTS: Abnormal gait, decreased activity tolerance, decreased balance, decreased cognition, decreased knowledge of use of DME, decreased mobility, difficulty walking, decreased ROM, decreased strength, increased edema, increased fascial restrictions, impaired flexibility, obesity, and pain.   ACTIVITY LIMITATIONS: standing, squatting, stairs, transfers, bed mobility, and locomotion level  PARTICIPATION LIMITATIONS: driving, shopping, community activity, occupation, and yard work  PERSONAL FACTORS: Age, Education, Fitness, Past/current experiences, Profession, and Time since onset of injury/illness/exacerbation are also affecting patient's functional outcome.   REHAB POTENTIAL: Good  CLINICAL DECISION MAKING: Evolving/moderate complexity  EVALUATION COMPLEXITY: Moderate   GOALS: Goals reviewed with patient? No  SHORT TERM GOALS: Target date: 09/21/2023    Will be compliant with appropriate progressive HEP Goal status: initial    2. L knee AROM flexion to be at least 115* and extension AROM to be no more than 3*  Goal status: initial   3. Will be independent with edema management strategies  Goal status: initial    4. Gait pattern to have normalized without LRAD  Goal status: Initial    LONG TERM GOALS: Target date: 10/19/2023    MMT to have improved by one grade all weak groups Goal status: initial   2. Will be able to perform floor to stand transfers without difficulty with no more than S  Goal status: initial     3. Will be able to ascend and descend stairs and ramp reciprocally with no increase in pain Goal status: initial   4. Will be able to ambulate community distances and perform all household tasks with no increase in pain  Goal status: initial   5. PSFS to have improved by at least 3 points to show improved QOL and subjective improvement  Goal status: Initial  6. Will be able to walk for exercise as desired without increase from resting pain levels Goal status: Initial     PLAN:  PT FREQUENCY: 2x/week  PT DURATION: 8 weeks  PLANNED INTERVENTIONS: [id number]- PT Re-evaluation, 97110-Therapeutic exercises, 97530- Therapeutic activity, 97112- Neuromuscular re-education, 97535- Self Care, [id number]- Manual therapy, [phone]- Gait training, [phone]- Electrical stimulation (unattended), [phone]- Ionotophoresis 4mg /ml Dexamethasone, Stair training, Taping, Dry Needling, Joint mobilization, and DME instructions  PLAN FOR NEXT SESSION: ROM, edema management, gait, balance, strength; closely monitor cog status for improvements or backslides   Nedra Hai, PT, DPT 08/24/23 12:01 PM

## 2023-08-25 ENCOUNTER — Encounter: Payer: Self-pay | Admitting: Family

## 2023-08-28 ENCOUNTER — Telehealth: Payer: Self-pay

## 2023-08-28 ENCOUNTER — Encounter: Payer: Self-pay | Admitting: Family Medicine

## 2023-08-28 ENCOUNTER — Ambulatory Visit (HOSPITAL_BASED_OUTPATIENT_CLINIC_OR_DEPARTMENT_OTHER)
Admission: RE | Admit: 2023-08-28 | Discharge: 2023-08-28 | Disposition: A | Discharge status: Home / Self Care | Source: Ambulatory Visit | Attending: Family Medicine | Admitting: Family Medicine

## 2023-08-28 ENCOUNTER — Ambulatory Visit (INDEPENDENT_AMBULATORY_CARE_PROVIDER_SITE_OTHER): Admitting: Family Medicine

## 2023-08-28 VITALS — BP 122/68 | HR 98 | Temp 98.2°F | Resp 18 | Ht 66.0 in | Wt 218.2 lb

## 2023-08-28 DIAGNOSIS — R410 Disorientation, unspecified: Secondary | ICD-10-CM | POA: Diagnosis not present

## 2023-08-28 DIAGNOSIS — R8281 Pyuria: Secondary | ICD-10-CM | POA: Diagnosis not present

## 2023-08-28 DIAGNOSIS — R29818 Other symptoms and signs involving the nervous system: Secondary | ICD-10-CM | POA: Diagnosis not present

## 2023-08-28 DIAGNOSIS — I6782 Cerebral ischemia: Secondary | ICD-10-CM | POA: Diagnosis not present

## 2023-08-28 LAB — POC URINALSYSI DIPSTICK (AUTOMATED)
Glucose, UA: NEGATIVE
Ketones, UA: NEGATIVE
Nitrite, UA: NEGATIVE
Protein, UA: NEGATIVE
Spec Grav, UA: 1.01 (ref 1.010–1.025)
Urobilinogen, UA: 0.2 U/dL
pH, UA: 6.5 (ref 5.0–8.0)

## 2023-08-28 MED ORDER — TRAZODONE HCL 50 MG PO TABS: 25.0000 mg | ORAL_TABLET | Freq: Every evening | ORAL | 3 refills | Status: DC | PRN

## 2023-08-28 NOTE — Progress Notes (Signed)
 Established Patient Office Visit  Subjective   Patient ID: Luis Knapp, male    DOB: April 07, 1951  Age: 73 y.o. MRN: [id number]  Chief Complaint  Patient presents with   Trouble sleeping    Pt states having trouble sleeping for 2 weeks and occ trouble sleeping.     HPI Discussed the use of AI scribe software for clinical note transcription with the patient, who gave verbal consent to proceed.  History of Present Illness Luis Knapp is a 73 year old male who presents with personality changes and speech difficulties following recent knee replacement surgery.  He experienced a sudden onset of stuttering and personality changes the day after his knee replacement surgery on August 10, 2023. He describes episodes of fast talking and difficulty controlling his speech, which is uncharacteristic for him. His wife notes that he has been expressing every thought and emotion out loud in minute detail, a significant change from his usual quiet demeanor.  Approximately 24 hours post-surgery, he experienced postoperative delirium for 13 hours, characterized by confusion, lack of recognition of his surroundings, and physical restlessness. This episode resolved after a dose of Haldol. Since then, he has had difficulty sleeping, with only two nights of adequate rest since his discharge on August 13, 2023. His wife believes that sleep deprivation may be contributing to his symptoms.  He has been experiencing shortness of breath, initially lasting 30 seconds but now extending to 30 minutes, particularly when using an incentive spirometer. This symptom resolves on its own. No issues with balance or walking, and he is able to perform daily activities such as showering independently.  His medication history includes postoperative use of OxyContin and Dilaudid, which he believes may have contributed to his confusion. He has not taken Oxycodone before this surgery and has been managing pain with Tylenol and  ibuprofen since discharge, avoiding Oxycodone due to previous adverse effects.  His wife notes a decreased appetite and difficulty with hearing, particularly in environments with background noise, which has been distressing for him. He has a history of prostate cancer treated with surgery in 2018 and recent radiation therapy in February 2025 for lymph node involvement. He also has a history of atrial fibrillation.   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   Medicare annual wellness visit, initial 10/18/2015   Hydrocele, right 06/24/2013   Routine general medical examination at a health care facility 12/04/2010   Morbid obesity (HCC) 05/18/2010   SCIATICA 05/18/2010   POLYP, COLON 08/03/2007   Essential hypertension 08/03/2007   Diverticulosis of colon 08/03/2007   Past Medical History:  Diagnosis Date   Allergic rhinitis due to pollen    Allergy    Arthritis    Constipation 2018   Diverticulosis of colon (without mention of hemorrhage)    ED (erectile dysfunction)    Elevated PSA    GERD (gastroesophageal reflux disease)    History of colon polyps    2008--  PRE CANEROUS/   2013-- ADENOMA   Low back pain 2018   Muscle spasm 2018   Muscle weakness 2018   Post-void dribbling 2018   Prostate cancer (HCC) 11/01/2016   prostate cancer   [name]    Stress incontinence 2018   Unspecified essential hypertension    Urinary  retention 2018   Wears glasses    Past Surgical History:  Procedure Laterality Date   COLONOSCOPY     COLONOSCOPY WITH PROPOFOL  10-30-2011  &  2008   POLYPECTOMY   HYDROCELE EXCISION Right 07/18/2013   Procedure: RIGHT HYDROCELECTOMY ADULT;  Surgeon: Crist Fat, MD;  Location: [facility];  Service: Urology;  Laterality: Right;   LYMPH NODE DISSECTION Bilateral 11/01/2016   Procedure: BILATERAL PELVIC LYMPH NODE DISSECTION;  Surgeon: Crist Fat, MD;  Location: [facility];  Service: Urology;  Laterality: Bilateral;   POLYPECTOMY     PROSTATE BIOPSY     ROBOT ASSISTED LAPAROSCOPIC RADICAL PROSTATECTOMY N/A 11/01/2016   Procedure: XI ROBOTIC ASSISTED LAPAROSCOPIC RADICAL PROSTATECTOMY;  Surgeon: Crist Fat, MD;  Location: [facility];  Service: Urology;  Laterality: N/A;   TOTAL KNEE ARTHROPLASTY Left 08/10/2023   Procedure: ARTHROPLASTY, KNEE, TOTAL;  Surgeon: Kathryne Hitch, MD;  Location: [facility];  Service: Orthopedics;  Laterality: Left;  LEFT TOTAL KNEE ARTHROPLASTY  SPINAL + BLOCK   Social History   Tobacco Use   Smoking status: Former    Current packs/day: 0.00    Types: Cigarettes    Quit date: 10/17/1995    Years since quitting: 27.8   Smokeless tobacco: Never  Vaping Use   Vaping status: Never Used  Substance Use Topics   Alcohol use: Not Currently   Drug use: No   Social History   Socioeconomic History   Marital status: Married    Spouse name: Not on file   Number of children: 2   Years of education: Not on file   Highest education level: 12th grade  Occupational History   Occupation: Retired - Probation officer: [employer]    Comment: [employer], trained as   Occupation: Catering manager firm    Comment: Printmaker  Tobacco Use   Smoking status: Former    Current packs/day: 0.00    Types: Cigarettes    Quit date: 10/17/1995    Years since quitting: 27.8   Smokeless tobacco: Never  Vaping Use   Vaping status: Never Used  Substance and Sexual Activity   Alcohol use: Not Currently   Drug use: No   Sexual activity: Yes    Comment: with aid of SIldenafil and VED  Other Topics Concern   Not on file  Social History Narrative   Married '77- 19 years divorced; married '02. 2 sons - '79, '80; 1 grandson.  Things are ok. Work is OK - Quarry manager.    Fun/Hobby: Works at [employer] on a regular basis   Former Smoker, quit in 1997   Social Drivers of [employer]: Low Risk  (08/28/2023)   Overall Financial Resource Strain (CARDIA)    Difficulty of Paying Living Expenses: Not hard at all  Food Insecurity: No Food Insecurity (08/28/2023)   Hunger Vital Sign    Worried About Running Out of Food in the Last Year: Never true    Ran Out of Food in the Last Year: Never true  Transportation Needs: No Transportation Needs (08/28/2023)   PRAPARE - Administrator, Civil Service (Medical): No    Lack of Transportation (Non-Medical): No  Physical Activity: Inactive (08/28/2023)   Exercise Vital Sign    Days of Exercise per Week: 0 days    Minutes of Exercise per Session: 30 min  Stress: Stress Concern Present (08/28/2023)   [employer]  of Occupational Health - Occupational Stress Questionnaire    Feeling of Stress : Very much  Social Connections: Moderately Isolated (08/28/2023)   Social Connection and Isolation Panel [NHANES]    Frequency of Communication with Friends and Family: Once a week    Frequency of Social Gatherings with Friends and Family: Never    Attends Religious Services: Never    Database administrator or Organizations: No    Attends Engineer, structural: More than 4 times per year    Marital Status: Married  Catering manager Violence: Not At Risk (08/10/2023)   Humiliation, Afraid, Rape, and Kick questionnaire    Fear of Current or Ex-Partner: No    Emotionally Abused: No    Physically Abused: No    Sexually Abused: No   Family Status  Relation Name Status   Father  Deceased at age 91   Mother  Deceased       born '76   Sister  Deceased   MGF  Deceased at age 6   Sister  Alive   Neg Hx  (Not Specified)  No partnership data on file   Family History  Problem Relation Age of Onset   Coronary artery disease Father     Heart attack Father        2nd one fatal   Pulmonary embolism Father        ?   Dementia Father    Heart disease Father    Colon cancer Father    Dementia Mother    Hypertension Mother    Macular degeneration Mother    Kidney disease Mother    Diabetes Sister    Non-Hodgkin's lymphoma Sister    Colon cancer Maternal Grandfather    Pancreatic cancer Sister    Prostate cancer Neg Hx    Rectal cancer Neg Hx    Colon polyps Neg Hx    Esophageal cancer Neg Hx    No Known Allergies    Review of Systems  Constitutional:  Negative for fever and malaise/fatigue.  HENT:  Negative for congestion.   Eyes:  Negative for blurred vision.  Respiratory:  Negative for cough and shortness of breath.   Cardiovascular:  Negative for chest pain, palpitations and leg swelling.  Gastrointestinal:  Negative for vomiting.  Musculoskeletal:  Negative for back pain.  Skin:  Negative for rash.  Neurological:  Positive for tremors and speech change. Negative for loss of consciousness and headaches.  Psychiatric/Behavioral:         Delirium ---  acute change in personality s/p surgery      Objective:     BP 122/68 (BP Location: Left Arm, Patient Position: Sitting, Cuff Size: Normal)   Pulse 98   Temp 98.2 F (36.8 C) (Oral)   Resp 18   Ht 5\' 6"  (1.676 m)   Wt 218 lb 3.2 oz (99 kg)   SpO2 98%   BMI 35.22 kg/m  BP Readings from Last 3 Encounters:  08/28/23 122/68  08/13/23 (!) 146/78  08/03/23 137/80   Wt Readings from Last 3 Encounters:  08/28/23 218 lb 3.2 oz (99 kg)  08/10/23 234 lb (106.1 kg)  08/03/23 234 lb (106.1 kg)   SpO2 Readings from Last 3 Encounters:  08/28/23 98%  08/13/23 98%  08/03/23 97%      Physical Exam   Results for orders placed or performed in visit on 08/28/23  POCT Urinalysis Dipstick (Automated)  Result Value Ref Range   Color, UA yellow  Clarity, UA cloudy    Glucose, UA Negative Negative   Bilirubin, UA small    Ketones, UA neg    Spec Grav,  UA 1.010 1.010 - 1.025   Blood, UA trace    pH, UA 6.5 5.0 - 8.0   Protein, UA Negative Negative   Urobilinogen, UA 0.2 0.2 or 1.0 E.U./dL   Nitrite, UA neg    Leukocytes, UA Trace (A) Negative    Last CBC Lab Results  Component Value Date   WBC 15.5 (H) 08/11/2023   HGB 12.1 (L) 08/11/2023   HCT 34.4 (L) 08/11/2023   MCV 88.9 08/11/2023   MCH 31.3 08/11/2023   RDW 13.2 08/11/2023   PLT 218 08/11/2023   Last metabolic panel Lab Results  Component Value Date   GLUCOSE 184 (H) 08/11/2023   NA 131 (L) 08/11/2023   K 3.7 08/11/2023   CL 97 (L) 08/11/2023   CO2 21 (L) 08/11/2023   BUN 20 08/11/2023   CREATININE 0.96 08/11/2023   GFRNONAA >60 08/11/2023   CALCIUM 8.2 (L) 08/11/2023   PROT 6.8 04/03/2023   ALBUMIN 4.5 04/03/2023   BILITOT 0.6 04/03/2023   ALKPHOS 77 04/03/2023   AST 23 04/03/2023   ALT 27 04/03/2023   ANIONGAP 13 08/11/2023   Last lipids Lab Results  Component Value Date   CHOL 118 04/03/2023   HDL 52.70 04/03/2023   LDLCALC 38 04/03/2023   LDLDIRECT 84.0 08/19/2019   TRIG 135.0 04/03/2023   CHOLHDL 2 04/03/2023   Last hemoglobin A1c No results found for: "HGBA1C" Last thyroid functions Lab Results  Component Value Date   TSH 3.70 06/24/2014   Last vitamin D Lab Results  Component Value Date   VD25OH 31.57 02/22/2018   Last vitamin B12 and Folate Lab Results  Component Value Date   VITAMINB12 465 04/03/2023      The ASCVD Risk score (Arnett DK, et al., 2019) failed to calculate for the following reasons:   The valid total cholesterol range is 130 to 320 mg/dL    Assessment & Plan:   Problem List Items Addressed This Visit   None Visit Diagnoses       Acute delirium    -  Primary   Relevant Orders   CT HEAD WO CONTRAST ( )   CBC with Differential/Platelet   Comprehensive metabolic panel with GFR   TSH   Vitamin B12   RPR   POCT Urinalysis Dipstick (Automated) (Completed)   Urine Culture     Pyuria       Relevant  Orders   Urine Culture     Assessment and Plan Assessment & Plan Personality Change and Stuttering   He has experienced a significant personality change and onset of stuttering since his knee replacement surgery. His demeanor has shifted from quiet to incessant talking, and stuttering began the day before the visit, progressively worsening. Differential diagnosis includes possible TIA, stroke, or other neurological events. He reports difficulty with hearing and sensitivity to background noise, which may be related. Order a CT scan of the brain to evaluate for possible TIA or stroke, and consider an MRI if the CT scan is inconclusive. Check hearing and consider an audiology referral if hearing issues persist.  Postoperative Delirium   He experienced postoperative delirium following knee replacement surgery on August 10, 2023. The delirium lasted 13 hours, characterized by confusion, restlessness, and disorientation, resolving after Haldol administration. It was likely exacerbated by opioids (OxyContin and Dilaudid) for pain management, to  which he may have been sensitive. Avoid the use of opioids if possible.  Sleep Disturbance   He is experiencing significant sleep disturbance since surgery, with difficulty sleeping at night and only short naps during the day, possibly contributing to personality changes and stuttering. He tried various over-the-counter sleep aids with limited success. Prescribe low-dose trazodone for sleep, starting with half a tablet at bedtime and increasing to a full tablet if needed.  Shortness of Breath   He reports episodes of shortness of breath associated with the use of an incentive spirometer. Initially, episodes lasted 30 seconds but have increased to 30 minutes. The shortness of breath resolves spontaneously and is not associated with exertion. Continue use of the incentive spirometer as tolerated.  Urinary Tract Infection (UTI)   There is concern for a possible UTI  due to a change in urine color and personality changes. A urine dipstick test was performed, and a urine culture was ordered to confirm the presence of infection. Perform a urine culture to rule out UTI.  General Health Maintenance   He is currently on baby aspirin post-surgery but has been advised to stop. He is alternating ibuprofen and Tylenol for pain management. His prostate cancer was treated with surgery and radiation. Continue alternating ibuprofen and Tylenol for pain management. Monitor blood pressure and oxygen saturation regularly.  Follow-up   Follow-up plans include obtaining results from the CT scan and urine culture. He is advised to seek emergency care if symptoms worsen or if there is a recurrence of delirium. Follow up on CT scan results and urine culture. Advise to seek emergency care if symptoms worsen or delirium recurs.   Return if symptoms worsen or fail to improve.    Renne Cornick R Lowne Chase, DO

## 2023-08-28 NOTE — Telephone Encounter (Signed)
 Scheduled to see Dr.Lowne pcp not available

## 2023-08-28 NOTE — Patient Instructions (Signed)
 Delirium Delirium is a state of mental confusion. It comes on quickly and causes significant changes in a person's thinking and behavior. People with delirium usually have trouble paying attention to what is going on or knowing where they are. They may become very withdrawn or very emotional and unable to sit still. They may even see or feel things that are not there (hallucinations). Delirium is a sign of a serious underlying medical condition. What are the causes? Delirium occurs when something suddenly affects the signals that the brain sends out. Brain signals can be affected by anything that puts severe stress on the body and brain and causes brain chemicals to be out of balance. The most common causes of delirium include: Infections. These may be bacterial, viral, fungal, or protozoal. Medicines. These include many over-the-counter and prescription medicines. Recreational drugs. Substance withdrawal. This occurs with sudden discontinuation of alcohol, certain medicines, or recreational drugs. Surgery and anesthesia. Sudden vascular events, such as stroke and brain hemorrhage. Other brain disorders, such as migraines, tumors, seizures, and physical head trauma. Metabolic disorders, such as kidney or liver failure. Low blood oxygen (anoxia). This may occur with lung disease, cardiac arrest, or carbon monoxide poisoning. Hormone imbalances (endocrinopathies), such as an overactive thyroid (hyperthyroidism) or underactive thyroid (hypothyroidism). Vitamin deficiencies. What increases the risk? The following factors may make someone more likely to develop this condition: Being a child. Being an older person. Living alone. Having vision loss or hearing loss. Having an existing brain disease, such as dementia. Having long-lasting (chronic) medical conditions, such as heart disease. Being hospitalized for long periods of time. What are the signs or symptoms? Delirium starts with a sudden  change in a person's thinking or behavior. Symptoms include: Not being able to stay awake (drowsiness) or pay attention. Being confused about places, time, and people. Forgetfulness. Having extreme energy levels. These may be low or high. Changes in sleep patterns. Extreme mood swings, such as sudden anger or anxiety. Focusing on things or ideas that are not important. Rambling and senseless talking. Difficulty speaking, understanding speech, or both. Hallucinations. Tremor or unsteady gait. Symptoms come and go throughout the day and are often worse at the end of the day. How is this diagnosed? People with delirium may not realize that they have the condition. Often, a family member or health care provider is the first person to notice the changes. This condition may be diagnosed based on a physical exam, health history, and tests. The health care provider will obtain a detailed history. This may include questions about: Current symptoms. Medical conditions that you have. Medicines. Drug use. The health care provider will perform a mental status test by: Asking questions to check for confusion. Watching for abnormal behavior. The health care provider may also order lab tests or additional studies to determine the cause of the delirium. How is this treated? Treatment of delirium depends on the cause and severity. Delirium usually goes away within days or weeks of treating the underlying cause. In the meantime, do not leave the person alone because he or she may accidentally cause self-harm. This condition may be treated with supportive care, such as: Increased light during the day and decreased light at night. Low noise level. Uninterrupted sleep. A regular daily schedule. Clocks and calendars to help with orientation. Familiar objects, including the person's pictures and clothing. Frequent visits from familiar family and friends. A healthy diet. Gentle exercise. In more severe  cases of delirium, medicine may be prescribed to help the  person keep calm and think more clearly. Follow these instructions at home:  Continue supportive care as told by a health care provider. Take over-the-counter and prescription medicines only as told by your health care provider. Ask a health care provider before using herbs or supplements. Do not use alcohol or illegal drugs. Keep all follow-up visits. This is important. Contact a health care provider if: Symptoms do not get better or they become worse. New symptoms of delirium develop. Caring for the person at home does not seem safe. Eating, drinking, or communicating stops. There are side effects of medicines, such as changes in sleep patterns, dizziness, weight gain, restlessness, movement changes, or tremors. Get help right away if: The person has thoughts of harming self or harming others. There are serious side effects of medicine, such as: Swelling of the face, lips, tongue, or throat. Fever, confusion, muscle spasms, or seizures. If you ever feel like a loved one may hurt himself or herself or others, or shares thoughts about taking his or her own life, get help right away. You can go to your nearest emergency department or: Call your local emergency services (911 in the [place] Call a suicide crisis helpline, such as the National Suicide Prevention Lifeline at (605)362-8770 or 988 in the [place] This is open 24 hours a day in the [place] If you're a Veteran: Call 988 and press 1. This is open 24 hours a day. Text the [employer] at 984-594-2594. Summary Delirium is a state of mental confusion. It comes on quickly and causes significant changes in a person's thinking and behavior. Delirium is a sign of a serious underlying medical condition. Certain medical conditions or a long hospital stay may increase the risk of developing delirium. Treatment of delirium involves treating the underlying cause and providing supportive  treatments, such as a calm and familiar environment. This information is not intended to replace advice given to you by your health care provider. Make sure you discuss any questions you have with your health care provider. Document Revised: 01/26/2023 Document Reviewed: 01/26/2023 Elsevier Patient Education  2024 [employer].

## 2023-08-28 NOTE — Telephone Encounter (Signed)
 Copied from CRM [id number]. Topic: Appointments - Appointment Scheduling >> Aug 28, 2023  7:52 AM Ovid Blow wrote: Patient/patient representative is calling to schedule an appointment. Patient is has not slept in two weeks and is now sleep deprived. Patient is talking to hisself. Has an appt. On 4/22. Put patient on waiting list.

## 2023-08-28 NOTE — Telephone Encounter (Signed)
 Can we get him in sooner w/ another provider please?

## 2023-08-29 ENCOUNTER — Telehealth: Payer: Self-pay

## 2023-08-29 ENCOUNTER — Ambulatory Visit: Payer: Self-pay

## 2023-08-29 ENCOUNTER — Encounter: Admitting: Rehabilitative and Restorative Service Providers"

## 2023-08-29 ENCOUNTER — Encounter: Payer: Self-pay | Admitting: Family

## 2023-08-29 ENCOUNTER — Encounter: Payer: Self-pay | Admitting: Family Medicine

## 2023-08-29 LAB — CBC WITH DIFFERENTIAL/PLATELET
Basophils Absolute: 0.1 10*3/uL (ref 0.0–0.1)
Basophils Relative: 1.4 % (ref 0.0–3.0)
Eosinophils Absolute: 0.2 10*3/uL (ref 0.0–0.7)
Eosinophils Relative: 2.4 % (ref 0.0–5.0)
HCT: 31.4 % — ABNORMAL LOW (ref 39.0–52.0)
Hemoglobin: 11 g/dL — ABNORMAL LOW (ref 13.0–17.0)
Lymphocytes Relative: 6.5 % — ABNORMAL LOW (ref 12.0–46.0)
Lymphs Abs: 0.4 10*3/uL — ABNORMAL LOW (ref 0.7–4.0)
MCHC: 34.9 g/dL (ref 30.0–36.0)
MCV: 89.8 fl (ref 78.0–100.0)
Monocytes Absolute: 0.6 10*3/uL (ref 0.1–1.0)
Monocytes Relative: 9 % (ref 3.0–12.0)
Neutro Abs: 5.2 10*3/uL (ref 1.4–7.7)
Neutrophils Relative %: 80.7 % — ABNORMAL HIGH (ref 43.0–77.0)
Platelets: 435 10*3/uL — ABNORMAL HIGH (ref 150.0–400.0)
RBC: 3.49 Mil/uL — ABNORMAL LOW (ref 4.22–5.81)
RDW: 14.6 % (ref 11.5–15.5)
WBC: 6.5 10*3/uL (ref 4.0–10.5)

## 2023-08-29 LAB — URINE CULTURE
MICRO NUMBER:: 16331877
Result:: NO GROWTH
SPECIMEN QUALITY:: ADEQUATE

## 2023-08-29 LAB — RPR: RPR Ser Ql: NONREACTIVE

## 2023-08-29 LAB — COMPREHENSIVE METABOLIC PANEL WITH GFR
ALT: 17 U/L (ref 0–53)
AST: 16 U/L (ref 0–37)
Albumin: 4.6 g/dL (ref 3.5–5.2)
Alkaline Phosphatase: 97 U/L (ref 39–117)
BUN: 26 mg/dL — ABNORMAL HIGH (ref 6–23)
CO2: 26 meq/L (ref 19–32)
Calcium: 9.8 mg/dL (ref 8.4–10.5)
Chloride: 96 meq/L (ref 96–112)
Creatinine, Ser: 1.26 mg/dL (ref 0.40–1.50)
GFR: 56.65 mL/min — ABNORMAL LOW (ref >60.00)
Glucose, Bld: 122 mg/dL — ABNORMAL HIGH (ref 70–99)
Potassium: 3.7 meq/L (ref 3.5–5.1)
Sodium: 135 meq/L (ref 135–145)
Total Bilirubin: 0.7 mg/dL (ref 0.2–1.2)
Total Protein: 6.7 g/dL (ref 6.0–8.3)

## 2023-08-29 LAB — TSH: TSH: 2.56 u[IU]/mL (ref 0.35–5.50)

## 2023-08-29 LAB — VITAMIN B12: Vitamin B-12: 435 pg/mL (ref 211–911)

## 2023-08-29 NOTE — Telephone Encounter (Signed)
 Called pts wife back and left a VM informing pt of the message.

## 2023-08-29 NOTE — Telephone Encounter (Signed)
 Chief Complaint: lab result questions Symptoms: mild SOB, mild dizziness Frequency: x 3 weeks Pertinent Negatives: Patient denies N/A. Disposition: [] ED /[] Urgent Care (no appt availability in office) / [x] Appointment(In office/virtual)/ []  [facility]/ [] Home Care/ [] Refused Recommended Disposition /[] [facility]/ []  Follow-up with PCP Additional Notes: Wife returning phone call from CMA. She states patient has had SOB and dizziness since his knee surgery. She states today he states the dizziness episodes lasted longer. Patient was recently given trazodone for sleep aide. Wife is unsure if that has caused the dizziness or his anemia. She would like to know if he should be on iron or Vitamin B12 pills. She states he gets out of breath after about 4-5 laps around the house. She states patient is staying well hydrated, not concerned about dehydration and states he is eating well (including high protein). Patient has appointment with PCP on Tuesday. Wife states his lab results were released and then removed from mychart; advised no note from provider and cannot discuss results at this time. Please advise.  Copied from CRM [id number]. Topic: Clinical - Medical Advice >> Aug 29, 2023  2:54 PM Alcus Dad wrote: Reason for CRM: patient's wife stated that patient is becoming dizzy and shortness of breath. Blood work shows possible anemia (RBC are below normal) but doctor says that it's fine. Wife wants to know if she can give patient something or does he need to be seen. Reason for Disposition  [1] MILD dizziness (e.g., walking normally) AND [2] has been evaluated by doctor (or NP/PA) for this  Answer Assessment - Initial Assessment Questions 1. DESCRIPTION: "Describe your dizziness."     "He just said he was in the bathroom and felt dizzy and one time he stood up and said he felt dizzy".  2. LIGHTHEADED: "Do you feel lightheaded?" (e.g., somewhat faint, woozy, weak upon standing)      Yes.  3. VERTIGO: "Do you feel like either you or the room is spinning or tilting?" (i.e. vertigo)     Denies.  4. SEVERITY: "How bad is it?"  "Do you feel like you are going to faint?" "Can you stand and walk?"   - MILD: Feels slightly dizzy, but walking normally.   - MODERATE: Feels unsteady when walking, but not falling; interferes with normal activities (e.g., school, work).   - SEVERE: Unable to walk without falling, or requires assistance to walk without falling; feels like passing out now.      Mild.  5. ONSET:  "When did the dizziness begin?"     X 3 weeks. Patient mentioned he felt like it worsened today because the episodes lasted longer.  6. AGGRAVATING FACTORS: "Does anything make it worse?" (e.g., standing, change in head position)     Standing up too quickly.  7. HEART RATE: "Can you tell me your heart rate?" "How many beats in 15 seconds?"  (Note: not all patients can do this)       She states it has been a little bit high. O2 is 96-98% and pulse rate is 80-90s but has hit 100.  8. CAUSE: "What do you think is causing the dizziness?"     Unsure if it is anemia or related to the new medication, trazodone.  9. RECURRENT SYMPTOM: "Have you had dizziness before?" If Yes, ask: "When was the last time?" "What happened that time?"     Yes, she states throughout his recovery from knee surgery patient has been experiencing.   10. OTHER  SYMPTOMS: "Do you have any other symptoms?" (e.g., fever, chest pain, vomiting, diarrhea, bleeding)       Mild SOB (mostly when exerting himself).  11. PREGNANCY: "Is there any chance you are pregnant?" "When was your last menstrual period?"       N/A.  Protocols used: Dizziness - Lightheadedness-A-AH

## 2023-08-29 NOTE — Telephone Encounter (Signed)
 Copied from CRM [id number]. Topic: General - Other >> Aug 29, 2023  9:52 AM Albertha Alosa wrote: Reason for CRM: Patient wife called in wanting to thank Dr. Crecencio Dodge for seeing patient on such short notice, she also wanted to inform her that the medicine that was given worked, and patient slept 8 hours last night she also stated patient would like to apologize about yesterday as he was alil bit agitated

## 2023-08-30 ENCOUNTER — Ambulatory Visit (INDEPENDENT_AMBULATORY_CARE_PROVIDER_SITE_OTHER): Admitting: Physical Therapy

## 2023-08-30 ENCOUNTER — Other Ambulatory Visit: Payer: Self-pay

## 2023-08-30 ENCOUNTER — Encounter: Payer: Self-pay | Admitting: Physical Therapy

## 2023-08-30 DIAGNOSIS — M25662 Stiffness of left knee, not elsewhere classified: Secondary | ICD-10-CM

## 2023-08-30 DIAGNOSIS — G8929 Other chronic pain: Secondary | ICD-10-CM

## 2023-08-30 DIAGNOSIS — M25562 Pain in left knee: Secondary | ICD-10-CM

## 2023-08-30 DIAGNOSIS — Z96652 Presence of left artificial knee joint: Secondary | ICD-10-CM

## 2023-08-30 DIAGNOSIS — R2681 Unsteadiness on feet: Secondary | ICD-10-CM

## 2023-08-30 DIAGNOSIS — R262 Difficulty in walking, not elsewhere classified: Secondary | ICD-10-CM | POA: Diagnosis not present

## 2023-08-30 DIAGNOSIS — R600 Localized edema: Secondary | ICD-10-CM | POA: Diagnosis not present

## 2023-08-30 NOTE — Therapy (Signed)
 OUTPATIENT PHYSICAL THERAPY LOWER EXTREMITY TREATMENT  Patient Name: Luis Knapp MRN: [id number] DOB:December 26, 1950, 73 y.o., male Today's Date: 08/30/2023  END OF SESSION:  PT End of Session - 08/30/23 0752     Visit Number 2    Number of Visits 17    Date for PT Re-Evaluation 10/19/23    Authorization Type MCR    Authorization Time Period 08/24/23 to 10/19/23    PT Start Time 0752    PT Stop Time 0859    PT Time Calculation (min) 67 min    Activity Tolerance Patient tolerated treatment well    Behavior During Therapy Flat affect;Impulsive;Anxious              Past Medical History:  Diagnosis Date   Allergic rhinitis due to pollen    Allergy    Arthritis    Constipation 2018   Diverticulosis of colon (without mention of hemorrhage)    ED (erectile dysfunction)    Elevated PSA    GERD (gastroesophageal reflux disease)    History of colon polyps    2008--  PRE CANEROUS/   2013-- ADENOMA   Low back pain 2018   Muscle spasm 2018   Muscle weakness 2018   Post-void dribbling 2018   Prostate cancer (HCC) 11/01/2016   prostate cancer   [name]    Stress incontinence 2018   Unspecified essential hypertension    Urinary retention 2018   Wears glasses    Past Surgical History:  Procedure Laterality Date   COLONOSCOPY     COLONOSCOPY WITH PROPOFOL  10-30-2011  &  2008   POLYPECTOMY   HYDROCELE EXCISION Right 07/18/2013   Procedure: RIGHT HYDROCELECTOMY ADULT;  Surgeon: Andrez Banker, MD;  Location[facility];  Service: Urology;  Laterality: Right;   LYMPH NODE DISSECTION Bilateral 11/01/2016   Procedure: BILATERAL PELVIC LYMPH NODE DISSECTION;  Surgeon: Andrez Banker, MD;  Location: [facility];  Service: Urology;  Laterality: Bilateral;   POLYPECTOMY     PROSTATE BIOPSY     ROBOT ASSISTED LAPAROSCOPIC RADICAL PROSTATECTOMY N/A 11/01/2016   Procedure: XI ROBOTIC ASSISTED LAPAROSCOPIC RADICAL PROSTATECTOMY;  Surgeon: Andrez Banker, MD;  Location: [facility];  Service: Urology;  Laterality: N/A;   TOTAL KNEE ARTHROPLASTY Left 08/10/2023   Procedure: ARTHROPLASTY, KNEE, TOTAL;  Surgeon: Arnie Lao, MD;  Location: [facility];  Service: Orthopedics;  Laterality: Left;  LEFT TOTAL KNEE ARTHROPLASTY  SPINAL + BLOCK   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   Medicare annual wellness visit, initial 10/18/2015   Hydrocele, right 06/24/2013   Routine general medical examination at a health care facility 12/04/2010   Morbid obesity (HCC) 05/18/2010   SCIATICA 05/18/2010   POLYP, COLON 08/03/2007   Essential hypertension 08/03/2007   Diverticulosis of colon 08/03/2007    PCP: Adra Alanis FNP   REFERRING PROVIDER: Arnie Lao, MD  REFERRING DIAG:  Diagnosis  [phone] (ICD-10-CM) - Status post total left knee replacement  M17.12 (ICD-10-CM) - Unilateral primary osteoarthritis, left knee    THERAPY DIAG:  Acute pain of left knee  Stiffness of left knee, not elsewhere classified  Localized edema  Difficulty in walking, not elsewhere classified  Unsteadiness on feet  Rationale for Evaluation  and Treatment: Rehabilitation  ONSET DATE: surgery 08/10/23  SUBJECTIVE:   SUBJECTIVE STATEMENT: He is noticing hearing loss so background noise is bothering him.  He got some cramp with strap assisted supine heel slide.   PERTINENT HISTORY: Lt TKA 08/10/23, OA, prostate CA, LBP  PAIN:  Are you having pain? Yes: NPRS scale: today at rest 0/10 with movement 1-2/10 and over last week up to 2/10 Pain location: surgical left knee  Pain description: post-op pain  Aggravating factors: everything especially if I push it too hard  Relieving factors:  not to sit for too long   PRECAUTIONS: Fall  RED FLAGS: None   WEIGHT BEARING RESTRICTIONS: No  FALLS:  Has patient fallen in last 6 months? No  LIVING ENVIRONMENT: Lives with: lives with their spouse Lives in: House/apartment Stairs: 2 steps from carport into house, also has concrete ramp about 60ft (pt reports it is too steep) Has following equipment at home: Single point cane, Walker - 2 wheeled, Environmental consultant - 4 wheeled, and Ramped entry  OCCUPATION: retired   PLOF: Independent, Independent with basic ADLs, Independent with gait, and Independent with transfers  PATIENT GOALS: get back to PLOF- walking for exercise, be able to do floor transfers, build stamina, navigate steps and ramp safely   NEXT MD VISIT: May 12th with Dr. Magnus Ivan  OBJECTIVE:  Note: Objective measures were completed at Evaluation unless otherwise noted.  DIAGNOSTIC FINDINGS: Narrative & Impression CLINICAL DATA:  Status post left knee replacement.   EXAM: PORTABLE LEFT KNEE - 1-2 VIEW   COMPARISON:  None Available.   FINDINGS: Left knee arthroplasty in expected alignment. No periprosthetic lucency or fracture. Recent postsurgical change includes air and edema in the soft tissues and joint space. Anterior skin staples in place.   IMPRESSION: Left knee arthroplasty without immediate postoperative complication.     Electronically Signed   By: Narda Rutherford M.D.   On: 08/10/2023 18:45  Narrative & Impression CLINICAL DATA:  Prostate carcinoma with biochemical recurrence.   EXAM: NUCLEAR MEDICINE PET SKULL BASE TO THIGH   TECHNIQUE: 7.5 mCi Flotufolastat (Posluma) was injected intravenously. Full-ring PET imaging was performed from the skull base to thigh after the radiotracer. CT data was obtained and used for attenuation correction and anatomic localization.   COMPARISON:  None Available.   FINDINGS: NECK   No radiotracer activity in neck lymph nodes.   Incidental CT finding:  None.   CHEST   No radiotracer accumulation within mediastinal or hilar lymph nodes. No suspicious pulmonary nodules on the CT scan.   Incidental CT finding: None.   ABDOMEN/PELVIS   Prostate: No focal activity in the prostate bed.   Lymph nodes: There is a focus of radiotracer activity in the presacral space just RIGHT of midline with SUV max equal 4.9 (image 164). There is a subtle soft tissue density measuring approximately 4 mm at this site (image 165/series 4)   The second tiny focus radiotracer activity along the LEFT internal iliac artery in the deep pelvis on image 180 with SUV max equal 3.4. No clear CT correlation.   Liver: No evidence of liver metastasis.   Incidental CT finding: None.   SKELETON   No focal activity to suggest skeletal metastasis.   IMPRESSION: 1. Two tiny foci of radiotracer activity in the pelvis are concerning for prostate cancer metastasis. 2. No evidence of local prostate cancer recurrence in the prostate bed. 3. No evidence of visceral metastasis or skeletal metastasis.  PATIENT SURVEYS:  Patient-Specific Activity Scoring Scheme  "0" represents "unable to perform." "10" represents "able to perform at prior level. 0 1 2 3 4 5 6 7 8 9  10 (Date and Score)   c LOWER EXTREMITY ROM:  Active ROM Left eval Left 08/30/23  Hip flexion    Hip extension    Hip abduction    Hip adduction    Hip internal rotation    Hip external rotation    Knee flexion 101* supine heel slide    Knee extension 7* supine quad set with heel prop    Ankle dorsiflexion    Ankle plantarflexion    Ankle inversion    Ankle eversion     (Blank rows = not tested)  LOWER EXTREMITY MMT:  MMT Left eval  Hip flexion 3+  Hip extension   Hip abduction   Hip adduction   Hip internal rotation   Hip external rotation   Knee flexion   Knee extension 4  Ankle dorsiflexion 5  Ankle plantarflexion   Ankle inversion   Ankle eversion    (Blank rows = not  tested)  FUNCTIONAL TESTS:  08/24/2023 Evaluation:  5 times sit to stand: 47 seconds no UEs  Timed up and go (TUG): 33 seconds 4WW   GAIT: 08/24/2023 Evaluation:  Distance walked: in clinic distances  Assistive device utilized: Environmental consultant - 4 wheeled Level of assistance: Modified independence Comments: slow gait pattern, intermittent heel toe, very slow pace of pain, some toe out B   TODAY'S TREATMENT                                                                          DATE: 08/30/2023 Therapeutic Exercise: Precor recumbent bike seat 6 rock for flex stretch 3 min, backwards 1 min, then full revolutions level 1 for 4 min. LLE  heel slide with foot on pillow case (decrease resistance) knee ext / quad set 5 sec and active knee flexion with RLE assisted end range 5 sec hamstring set 10 reps 2 sets.  LLE supine heel slide with ball & strap in BUEs rolling ball for knee flexion 5 sec hold and roll out 5 sec leg press / quad set. 10 reps 2 sets. Pt & wife verbalized improvement to how he has been doing it at home.   Gait Training: PT adjusted pt's rollator walker and tightened handles.  PT demo & verbal cues on use of rollator walker to pt & wife.  Pt amb with safer and more fluent form.  He reported felt more comfortable.   Self-Care: PT demo & verbal cues on elevation with LE higher than heart >/= 2x/day for >/= 15 min.    Vaso knee with elevation 10 min medium compression 34*  TREATMENT DATE:  08/24/23  Eval, care planning, education on general recovery from TKR including continuing to work on hospital and HHPT HEPs, activity tolerance impairments following surgery, PT expectations and anticipated course of care, discussed concerns over lack of sleep with spouse and educated that we will monitor  and reach out to MD for further assistance managing this PRN     PATIENT EDUCATION:  Education details: see above  Person educated: Patient and Spouse Education method: Explanation and Verbal cues Education comprehension: verbalized understanding, returned demonstration, and needs further education  HOME EXERCISE PROGRAM: Hospital and HHPT HEPs for now   ASSESSMENT:  CLINICAL IMPRESSION: Patient appeared to respond to knee flexion & extension activities with decreased resistance.  His wife & he appear to have better understanding of rollator walker use.  He responded better in less busy environment.    OBJECTIVE IMPAIRMENTS: Abnormal gait, decreased activity tolerance, decreased balance, decreased cognition, decreased knowledge of use of DME, decreased mobility, difficulty walking, decreased ROM, decreased strength, increased edema, increased fascial restrictions, impaired flexibility, obesity, and pain.   ACTIVITY LIMITATIONS: standing, squatting, stairs, transfers, bed mobility, and locomotion level  PARTICIPATION LIMITATIONS: driving, shopping, community activity, occupation, and yard work  PERSONAL FACTORS: Age, Education, Fitness, Past/current experiences, Profession, and Time since onset of injury/illness/exacerbation are also affecting patient's functional outcome.   REHAB POTENTIAL: Good  CLINICAL DECISION MAKING: Evolving/moderate complexity  EVALUATION COMPLEXITY: Moderate   GOALS: Goals reviewed with patient? No  SHORT TERM GOALS: Target date: 09/21/2023    Will be compliant with appropriate progressive HEP Goal status: Ongoing   08/30/2023   2. L knee AROM flexion to be at least 115* and extension AROM to be no more than 3*  Goal status: iOngoing   08/30/2023   3. Will be independent with edema management strategies  Goal status: Ongoing   08/30/2023   4. Gait pattern to have normalized without LRAD  Goal status: Ongoing   08/30/2023   LONG TERM GOALS: Target date: 10/19/2023    MMT to have improved by one grade all  weak groups Goal status: Ongoing   08/30/2023   2. Will be able to perform floor to stand transfers without difficulty with no more than S  Goal status:Ongoing   08/30/2023   3. Will be able to ascend and descend stairs and ramp reciprocally with no increase in pain Goal status: Ongoing   08/30/2023   4. Will be able to ambulate community distances and perform all household tasks with no increase in pain  Goal status: Ongoing   08/30/2023   5. PSFS to have improved by at least 3 points to show improved QOL and subjective improvement  Goal status: Ongoing   08/30/2023  6. Will be able to walk for exercise as desired without increase from resting pain levels Goal status: Ongoing   08/30/2023    PLAN:  PT FREQUENCY: 2x/week  PT DURATION: 8 weeks  PLANNED INTERVENTIONS: 97164- PT Re-evaluation, 97110-Therapeutic exercises, 97530- Therapeutic activity, 97112- Neuromuscular re-education, 97535- Self Care, 16109- Manual therapy, [phone]- Gait training, [phone]- Electrical stimulation (unattended), [phone]- Ionotophoresis 4mg /ml Dexamethasone, Stair training, Taping, Dry Needling, Joint mobilization, and DME instructions  PLAN FOR NEXT SESSION: continue with manual therapy & exercises for ROM, edema management, gait, balance, strength; closely monitor cog status for improvements or backslides     Vladimir Faster, PT, DPT 08/30/2023, 9:59 AM

## 2023-09-03 ENCOUNTER — Encounter: Payer: Self-pay | Admitting: Physical Therapy

## 2023-09-03 ENCOUNTER — Ambulatory Visit (INDEPENDENT_AMBULATORY_CARE_PROVIDER_SITE_OTHER): Payer: Self-pay | Admitting: Physical Therapy

## 2023-09-03 DIAGNOSIS — M25562 Pain in left knee: Secondary | ICD-10-CM | POA: Diagnosis not present

## 2023-09-03 DIAGNOSIS — M25662 Stiffness of left knee, not elsewhere classified: Secondary | ICD-10-CM

## 2023-09-03 DIAGNOSIS — R600 Localized edema: Secondary | ICD-10-CM

## 2023-09-03 DIAGNOSIS — R262 Difficulty in walking, not elsewhere classified: Secondary | ICD-10-CM

## 2023-09-03 DIAGNOSIS — R2681 Unsteadiness on feet: Secondary | ICD-10-CM

## 2023-09-03 NOTE — Therapy (Signed)
 OUTPATIENT PHYSICAL THERAPY LOWER EXTREMITY TREATMENT  Patient Name: Luis Knapp MRN: [id number] DOB:January 19, 1951, 73 y.o., male Today's Date: 09/03/2023  END OF SESSION:  PT End of Session - 09/03/23 0935     Visit Number 3    Number of Visits 17    Date for PT Re-Evaluation 10/19/23    Authorization Type MCR    Progress Note Due on Visit 10    PT Start Time 0930    PT Stop Time 1025    PT Time Calculation (min) 55 min    Activity Tolerance Patient tolerated treatment well    Behavior During Therapy Flat affect;Impulsive;Anxious               Past Medical History:  Diagnosis Date   Allergic rhinitis due to pollen    Allergy    Arthritis    Constipation 2018   Diverticulosis of colon (without mention of hemorrhage)    ED (erectile dysfunction)    Elevated PSA    GERD (gastroesophageal reflux disease)    History of colon polyps    2008--  PRE CANEROUS/   2013-- ADENOMA   Low back pain 2018   Muscle spasm 2018   Muscle weakness 2018   Post-void dribbling 2018   Prostate cancer (HCC) 11/01/2016   prostate cancer   [name]    Stress incontinence 2018   Unspecified essential hypertension    Urinary retention 2018   Wears glasses    Past Surgical History:  Procedure Laterality Date   COLONOSCOPY     COLONOSCOPY WITH PROPOFOL   10-30-2011  &  2008   POLYPECTOMY   HYDROCELE EXCISION Right 07/18/2013   Procedure: RIGHT HYDROCELECTOMY ADULT;  Surgeon: Andrez Banker, MD;  Location: [facility];  Service: Urology;  Laterality: Right;   LYMPH NODE DISSECTION Bilateral 11/01/2016   Procedure: BILATERAL PELVIC LYMPH NODE DISSECTION;  Surgeon: Andrez Banker, MD;  Location: [facility];  Service: Urology;  Laterality: Bilateral;   POLYPECTOMY     PROSTATE BIOPSY     ROBOT ASSISTED LAPAROSCOPIC RADICAL PROSTATECTOMY N/A 11/01/2016   Procedure: XI ROBOTIC ASSISTED LAPAROSCOPIC RADICAL PROSTATECTOMY;  Surgeon: Andrez Banker, MD;   Location: [facility];  Service: Urology;  Laterality: N/A;   TOTAL KNEE ARTHROPLASTY Left 08/10/2023   Procedure: ARTHROPLASTY, KNEE, TOTAL;  Surgeon: Arnie Lao, MD;  Location: [facility];  Service: Orthopedics;  Laterality: Left;  LEFT TOTAL KNEE ARTHROPLASTY  SPINAL + BLOCK   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   Medicare annual wellness visit, initial 10/18/2015   Hydrocele, right 06/24/2013   Routine general medical examination at a health care facility 12/04/2010   Morbid obesity (HCC) 05/18/2010   SCIATICA 05/18/2010   POLYP, COLON 08/03/2007   Essential hypertension 08/03/2007   Diverticulosis of colon 08/03/2007    PCP: Adra Alanis FNP   REFERRING PROVIDER: Arnie Lao, MD  REFERRING DIAG:  Diagnosis  [phone] (ICD-10-CM) - Status post total left knee replacement  M17.12 (ICD-10-CM) - Unilateral primary osteoarthritis, left knee    THERAPY DIAG:  Acute pain of left knee  Stiffness of left knee, not elsewhere classified  Localized edema  Difficulty in walking, not elsewhere classified  Unsteadiness on feet  Rationale for  Evaluation and Treatment: Rehabilitation  ONSET DATE: surgery 08/10/23  SUBJECTIVE:   SUBJECTIVE STATEMENT: He has been doing better some what.    PERTINENT HISTORY: Lt TKA 08/10/23, OA, prostate CA, LBP  PAIN:  Are you having pain? Yes: NPRS scale: today at rest 0/10 with movement 1-2/10 and over last week up to 2/10 Pain location: surgical left knee  Pain description: post-op pain  Aggravating factors: everything especially if I push it too hard  Relieving factors: not to sit for too long   PRECAUTIONS: Fall  RED FLAGS: None   WEIGHT BEARING  RESTRICTIONS: No  FALLS:  Has patient fallen in last 6 months? No  LIVING ENVIRONMENT: Lives with: lives with their spouse Lives in: House/apartment Stairs: 2 steps from carport into house, also has concrete ramp about 88ft (pt reports it is too steep) Has following equipment at home: Single point cane, Walker - 2 wheeled, Environmental consultant - 4 wheeled, and Ramped entry  OCCUPATION: retired   PLOF: Independent, Independent with basic ADLs, Independent with gait, and Independent with transfers  PATIENT GOALS: get back to PLOF- walking for exercise, be able to do floor transfers, build stamina, navigate steps and ramp safely   NEXT MD VISIT: May 12th with Dr. Lucienne Ryder  OBJECTIVE:  Note: Objective measures were completed at Evaluation unless otherwise noted.  DIAGNOSTIC FINDINGS: Narrative & Impression CLINICAL DATA:  Status post left knee replacement.   EXAM: PORTABLE LEFT KNEE - 1-2 VIEW   COMPARISON:  None Available.   FINDINGS: Left knee arthroplasty in expected alignment. No periprosthetic lucency or fracture. Recent postsurgical change includes air and edema in the soft tissues and joint space. Anterior skin staples in place.   IMPRESSION: Left knee arthroplasty without immediate postoperative complication.     Electronically Signed   By: Chadwick Colonel M.D.   On: 08/10/2023 18:45  Narrative & Impression CLINICAL DATA:  Prostate carcinoma with biochemical recurrence.   EXAM: NUCLEAR MEDICINE PET SKULL BASE TO THIGH   TECHNIQUE: 7.5 mCi Flotufolastat (Posluma ) was injected intravenously. Full-ring PET imaging was performed from the skull base to thigh after the radiotracer. CT data was obtained and used for attenuation correction and anatomic localization.   COMPARISON:  None Available.   FINDINGS: NECK   No radiotracer activity in neck lymph nodes.   Incidental CT finding: None.   CHEST   No radiotracer accumulation within mediastinal or hilar lymph  nodes. No suspicious pulmonary nodules on the CT scan.   Incidental CT finding: None.   ABDOMEN/PELVIS   Prostate: No focal activity in the prostate bed.   Lymph nodes: There is a focus of radiotracer activity in the presacral space just RIGHT of midline with SUV max equal 4.9 (image 164). There is a subtle soft tissue density measuring approximately 4 mm at this site (image 165/series 4)   The second tiny focus radiotracer activity along the LEFT internal iliac artery in the deep pelvis on image 180 with SUV max equal 3.4. No clear CT correlation.   Liver: No evidence of liver metastasis.   Incidental CT finding: None.   SKELETON   No focal activity to suggest skeletal metastasis.   IMPRESSION: 1. Two tiny foci of radiotracer activity in the pelvis are concerning for prostate cancer metastasis. 2. No evidence of local prostate cancer recurrence in the prostate bed. 3. No evidence of visceral metastasis or skeletal metastasis.  PATIENT SURVEYS:    Patient-Specific Activity Scoring Scheme  "0" represents "unable to perform." "10" represents "  able to perform at prior level. 0 1 2 3 4 5 6 7 8 9  10 (Date and Score)   c LOWER EXTREMITY ROM:  Active ROM Left eval Left 08/30/23  Hip flexion    Hip extension    Hip abduction    Hip adduction    Hip internal rotation    Hip external rotation    Knee flexion 101* supine heel slide  Supine heel slide 105*  Knee extension 7* supine quad set with heel prop    Ankle dorsiflexion    Ankle plantarflexion    Ankle inversion    Ankle eversion     (Blank rows = not tested)  LOWER EXTREMITY MMT:  MMT Left eval  Hip flexion 3+  Hip extension   Hip abduction   Hip adduction   Hip internal rotation   Hip external rotation   Knee flexion   Knee extension 4  Ankle dorsiflexion 5  Ankle plantarflexion   Ankle inversion   Ankle eversion    (Blank rows = not tested)  FUNCTIONAL TESTS:  08/24/2023 Evaluation:  5  times sit to stand: 47 seconds no UEs  Timed up and go (TUG): 33 seconds 4WW   GAIT: 08/24/2023 Evaluation:  Distance walked: in clinic distances  Assistive device utilized: Environmental consultant - 4 wheeled Level of assistance: Modified independence Comments: slow gait pattern, intermittent heel toe, very slow pace of pain, some toe out B   TODAY'S TREATMENT                                                                          DATE: 08/30/2023 Therapeutic Exercise: Precor recumbent bike seat 7 rock for flex stretch 2.5 min, backwards 1.5 min, then full revolutions level 1 for 5 min. Gastroc stretch step heel depression 30 sec hold 3 reps ea LE LLE Hamstring stretch seated with strap assist 30 second hold 3 reps Quad stretch with LLE over edge of bed strap assisted knee flexion 30 second hold 3 reps LLE supine heel slide with ball & strap in BUEs rolling ball for knee flexion 5 sec hold and roll out 5 sec leg press / quad set. 10 reps 2 sets.  Vaso knee with elevation 10 min medium compression 34*  TREATMENT                                                                          DATE: 08/30/2023 Therapeutic Exercise: Precor recumbent bike seat 6 rock for flex stretch 3 min, backwards 1 min, then full revolutions level 1 for 4 min. LLE  heel slide with foot on pillow case (decrease resistance) knee ext / quad set 5 sec and active knee flexion with RLE assisted end range 5 sec hamstring set 10 reps 2 sets.  LLE supine heel slide with ball & strap in BUEs rolling ball for knee flexion 5 sec hold and roll out 5 sec leg press / quad set. 10 reps  2 sets. Pt & wife verbalized improvement to how he has been doing it at home.   Gait Training: PT adjusted pt's rollator walker and tightened handles.  PT demo & verbal cues on use of rollator walker to pt & wife.  Pt amb with safer and more fluent form.  He reported felt more comfortable.   Self-Care: PT demo & verbal cues on elevation with LE higher than heart  >/= 2x/day for >/= 15 min.    Vaso knee with elevation 10 min medium compression 34*                                                                                                                               TREATMENT DATE:  08/24/23  Eval, care planning, education on general recovery from TKR including continuing to work on hospital and HHPT HEPs, activity tolerance impairments following surgery, PT expectations and anticipated course of care, discussed concerns over lack of sleep with spouse and educated that we will monitor  and reach out to MD for further assistance managing this PRN    PATIENT EDUCATION:  Education details: see above  Person educated: Patient and Spouse Education method: Explanation and Verbal cues Education comprehension: verbalized understanding, returned demonstration, and needs further education  HOME EXERCISE PROGRAM: Access Code: [email or web address] URL: [email or web address] Date: 09/03/2023 Prepared by: Lorie Rook  Exercises - Ankle Alphabet in Elevation  - 2-3 x daily - 7 x weekly - 1 sets - 1-3 reps - 15 minutes hold - Seated Knee Flexion Extension AROM   - 1-3 x daily - 7 x weekly - 1-3 sets - 10 reps - 5 seconds hold - Supine Heel Slide with Strap  - 1-3 x daily - 7 x weekly - 1-3 sets - 10 reps - 5 seconds hold - Gastroc Stretch on Step  - 1-3 x daily - 7 x weekly - 1 sets - 3 reps - 30 seconds hold - Seated Hamstring Stretch with Strap  - 1-3 x daily - 7 x weekly - 1 sets - 3 reps - 30 seconds hold - Supine Quadriceps Stretch with Strap on Table  - 1-3 x daily - 7 x weekly - 1 sets - 3 reps - 30 seconds hold    ASSESSMENT:  CLINICAL IMPRESSION: Patient appears to understand updated HEP to include stretches.  He did report that the stretches helped his leg to feel better.  He responds better in less busy environment.    OBJECTIVE IMPAIRMENTS: Abnormal gait, decreased activity tolerance, decreased balance, decreased cognition, decreased  knowledge of use of DME, decreased mobility, difficulty walking, decreased ROM, decreased strength, increased edema, increased fascial restrictions, impaired flexibility, obesity, and pain.   ACTIVITY LIMITATIONS: standing, squatting, stairs, transfers, bed mobility, and locomotion level  PARTICIPATION LIMITATIONS: driving, shopping, community activity, occupation, and yard work  PERSONAL FACTORS: Age, Education, Fitness, Past/current experiences, Profession, and Time since onset of injury/illness/exacerbation are also affecting patient's functional outcome.  REHAB POTENTIAL: Good  CLINICAL DECISION MAKING: Evolving/moderate complexity  EVALUATION COMPLEXITY: Moderate   GOALS: Goals reviewed with patient? No  SHORT TERM GOALS: Target date: 09/21/2023    Will be compliant with appropriate progressive HEP Goal status: Ongoing   08/30/2023   2. L knee AROM flexion to be at least 115* and extension AROM to be no more than 3*  Goal status: iOngoing   08/30/2023   3. Will be independent with edema management strategies  Goal status: Ongoing   08/30/2023   4. Gait pattern to have normalized without LRAD  Goal status: Ongoing   08/30/2023   LONG TERM GOALS: Target date: 10/19/2023    MMT to have improved by one grade all weak groups Goal status: Ongoing   08/30/2023   2. Will be able to perform floor to stand transfers without difficulty with no more than S  Goal status:Ongoing   08/30/2023   3. Will be able to ascend and descend stairs and ramp reciprocally with no increase in pain Goal status: Ongoing   08/30/2023   4. Will be able to ambulate community distances and perform all household tasks with no increase in pain  Goal status: Ongoing   08/30/2023   5. PSFS to have improved by at least 3 points to show improved QOL and subjective improvement  Goal status: Ongoing   08/30/2023  6. Will be able to walk for exercise as desired without increase from resting pain  levels Goal status: Ongoing   08/30/2023    PLAN:  PT FREQUENCY: 2x/week  PT DURATION: 8 weeks  PLANNED INTERVENTIONS: 97164- PT Re-evaluation, 97110-Therapeutic exercises, 97530- Therapeutic activity, 97112- Neuromuscular re-education, 97535- Self Care, [id number]- Manual therapy, [phone]- Gait training, [phone]- Electrical stimulation (unattended), [phone]- Ionotophoresis 4mg /ml Dexamethasone , Stair training, Taping, Dry Needling, Joint mobilization, and DME instructions  PLAN FOR NEXT SESSION: Check ROM, continue with manual therapy & exercises for ROM, + standing balance activities to program.  Edema management, gait, balance, strength;     Lorie Rook, PT, DPT 09/03/2023, 3:41 PM

## 2023-09-04 ENCOUNTER — Ambulatory Visit (INDEPENDENT_AMBULATORY_CARE_PROVIDER_SITE_OTHER): Admitting: Family

## 2023-09-04 ENCOUNTER — Encounter: Payer: Self-pay | Admitting: Family

## 2023-09-04 ENCOUNTER — Ambulatory Visit: Admitting: Family

## 2023-09-04 VITALS — BP 98/64 | HR 91 | Ht 66.0 in | Wt 219.8 lb

## 2023-09-04 DIAGNOSIS — H9190 Unspecified hearing loss, unspecified ear: Secondary | ICD-10-CM

## 2023-09-04 DIAGNOSIS — D649 Anemia, unspecified: Secondary | ICD-10-CM

## 2023-09-04 DIAGNOSIS — T50905D Adverse effect of unspecified drugs, medicaments and biological substances, subsequent encounter: Secondary | ICD-10-CM | POA: Diagnosis not present

## 2023-09-04 DIAGNOSIS — I10 Essential (primary) hypertension: Secondary | ICD-10-CM

## 2023-09-04 LAB — CBC WITH DIFFERENTIAL/PLATELET
Basophils Absolute: 0.1 10*3/uL (ref 0.0–0.1)
Basophils Relative: 1.3 % (ref 0.0–3.0)
Eosinophils Absolute: 0.3 10*3/uL (ref 0.0–0.7)
Eosinophils Relative: 6.8 % — ABNORMAL HIGH (ref 0.0–5.0)
HCT: 33.1 % — ABNORMAL LOW (ref 39.0–52.0)
Hemoglobin: 11.3 g/dL — ABNORMAL LOW (ref 13.0–17.0)
Lymphocytes Relative: 7.5 % — ABNORMAL LOW (ref 12.0–46.0)
Lymphs Abs: 0.4 10*3/uL — ABNORMAL LOW (ref 0.7–4.0)
MCHC: 34.2 g/dL (ref 30.0–36.0)
MCV: 90.9 fl (ref 78.0–100.0)
Monocytes Absolute: 0.4 10*3/uL (ref 0.1–1.0)
Monocytes Relative: 8 % (ref 3.0–12.0)
Neutro Abs: 3.7 10*3/uL (ref 1.4–7.7)
Neutrophils Relative %: 76.4 % (ref 43.0–77.0)
Platelets: 348 10*3/uL (ref 150.0–400.0)
RBC: 3.64 Mil/uL — ABNORMAL LOW (ref 4.22–5.81)
RDW: 15.8 % — ABNORMAL HIGH (ref 11.5–15.5)
WBC: 4.8 10*3/uL (ref 4.0–10.5)

## 2023-09-04 NOTE — Patient Instructions (Signed)
 Please hold the Amlodipine  at night as we discussed; please start checking your blood pressure daily as we discussed and send me readings in the next 7-10 days;

## 2023-09-04 NOTE — Progress Notes (Signed)
 Luis Knapp is a 73 y.o. 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 90 tablet 3   Cholecalciferol  (VITAMIN D -3) 1000 units CAPS Take 1,000 Units by mouth daily.     Coenzyme Q10 (COQ-10) 100 MG CAPS Take 100 mg by mouth 2 (two) times daily.     ibuprofen (ADVIL) 200 MG tablet Take 200 mg by mouth every 6 (six) hours as needed for mild pain (pain score 1-3).     rosuvastatin  (CRESTOR ) 10 MG tablet Take 1 tablet (10 mg total) by mouth daily. 90 tablet 3   tiZANidine  (ZANAFLEX ) 2 MG tablet Take 1 tablet (2 mg total) by mouth every 6 (six) hours as needed for muscle spasms. 30 tablet 1   traZODone  (DESYREL ) 50 MG tablet Take 0.5-1 tablets (25-50 mg total) by mouth at bedtime as needed for sleep. 30 tablet 3   valsartan -hydrochlorothiazide  (DIOVAN -HCT) 320-25 MG tablet Take 1 tablet by mouth daily. 90 tablet 3   No current facility-administered medications for this visit.    Allergies: Dilaudid  [hydromorphone ]  Past Medical History:  Diagnosis Date   Allergic rhinitis due to pollen    Allergy    Arthritis    Constipation 2018   Diverticulosis of  colon (without mention of hemorrhage)    ED (erectile dysfunction)    Elevated PSA    GERD (gastroesophageal reflux disease)    History of colon polyps    2008--  PRE CANEROUS/   2013-- ADENOMA   Low back pain 2018   Muscle spasm 2018   Muscle weakness 2018   Post-void dribbling 2018   Prostate cancer (HCC) 11/01/2016   prostate cancer   [name]    Stress incontinence 2018   Unspecified essential hypertension    Urinary retention 2018   Wears glasses     Past Surgical History:  Procedure Laterality Date   COLONOSCOPY     COLONOSCOPY WITH PROPOFOL   10-30-2011  &  2008   POLYPECTOMY   HYDROCELE EXCISION Right 07/18/2013   Procedure: RIGHT HYDROCELECTOMY ADULT;  Surgeon: Andrez Banker, MD;  Location: [facility];  Service: Urology;  Laterality: Right;   LYMPH NODE DISSECTION Bilateral 11/01/2016   Procedure: BILATERAL PELVIC LYMPH NODE DISSECTION;  Surgeon: Andrez Banker, MD;  Location: [facility];  Service: Urology;  Laterality: Bilateral;   POLYPECTOMY     PROSTATE BIOPSY     ROBOT ASSISTED LAPAROSCOPIC RADICAL PROSTATECTOMY N/A 11/01/2016   Procedure: XI ROBOTIC ASSISTED LAPAROSCOPIC RADICAL PROSTATECTOMY;  Surgeon: Andrez Banker, MD;  Location: [facility];  Service: Urology;  Laterality: N/A;   TOTAL KNEE ARTHROPLASTY Left 08/10/2023   Procedure: ARTHROPLASTY, KNEE,  TOTAL;  Surgeon: Arnie Lao, MD;  Location: [facility];  Service: Orthopedics;  Laterality: Left;  LEFT TOTAL KNEE ARTHROPLASTY  SPINAL + BLOCK    Family History  Problem Relation Age of Onset   Coronary artery disease Father    Heart attack Father        2nd one fatal   Pulmonary embolism Father        ?   Dementia Father    Heart disease Father    Colon cancer Father    Dementia Mother    Hypertension Mother    Macular degeneration Mother    Kidney disease Mother    Diabetes Sister    Non-Hodgkin's lymphoma Sister    Colon cancer Maternal Grandfather     Pancreatic cancer Sister    Prostate cancer Neg Hx    Rectal cancer Neg Hx    Colon polyps Neg Hx    Esophageal cancer Neg Hx     Social History   Tobacco Use   Smoking status: Former    Current packs/day: 0.00    Types: Cigarettes    Quit date: 10/17/1995    Years since quitting: 27.9   Smokeless tobacco: Never  Substance Use Topics   Alcohol use: Not Currently    Subjective:   Patient accompanied by wife today; was seen last week by another provider in our office with delirium last week- thought to be related to narcotics post-op. Patient notes that stared feeling much better last Saturday and symptoms of hallucinations/ delirium resolved; still not sleeping well- was given Trazodone  at OV last week;     Objective:  Vitals:   09/04/23 1119  BP: 98/64  Pulse: 91  SpO2: 99%  Weight: 219 lb 12.8 oz (99.7 kg)  Height: 5\' 6"  (1.676 m)    General: Well developed, well nourished, in no acute distress  Skin : Warm and dry.  Head: Normocephalic and atraumatic  Lungs: Respirations unlabored; clear to auscultation bilaterally without wheeze, rales, rhonchi  CVS exam: normal rate and regular rhythm.  Neurologic: Alert and oriented; speech intact; face symmetrical; moves all extremities well; CNII-XII intact without focal deficit   Assessment:  1. Hearing loss, unspecified hearing loss type, unspecified laterality   2. Low hemoglobin   3. Essential hypertension   4. Adverse effect of drug, subsequent encounter     Plan:  Refer to ENT to further evaluate hearing loss concerns; Check CBC today to ensure stability; Concern that pressure is too low with recent weight loss; hold Amlodipine  for now and send readings in the next 7-10 days; may need to cut Valsartan  in 1/2 as well; keep planned follow up for May; Agree that Dilaudid  sounds like it was most obvious cause of post-op complications; will update as allergy;   Time spent 30 minutes  No follow-ups on file.  Orders Placed  This Encounter  Procedures   CBC with Differential/Platelet   Ambulatory referral to ENT    Referral Priority:   Routine    Referral Type:   Consultation    Referral Reason:   Specialty Services Required    Requested Specialty:   Otolaryngology    Number of Visits Requested:   1    Requested Prescriptions    No prescriptions requested or ordered in this encounter

## 2023-09-07 ENCOUNTER — Encounter: Payer: Self-pay | Admitting: Physical Therapy

## 2023-09-07 ENCOUNTER — Ambulatory Visit (INDEPENDENT_AMBULATORY_CARE_PROVIDER_SITE_OTHER): Payer: Self-pay | Admitting: Physical Therapy

## 2023-09-07 DIAGNOSIS — M25662 Stiffness of left knee, not elsewhere classified: Secondary | ICD-10-CM | POA: Diagnosis not present

## 2023-09-07 DIAGNOSIS — M25562 Pain in left knee: Secondary | ICD-10-CM

## 2023-09-07 DIAGNOSIS — R262 Difficulty in walking, not elsewhere classified: Secondary | ICD-10-CM

## 2023-09-07 DIAGNOSIS — R2681 Unsteadiness on feet: Secondary | ICD-10-CM

## 2023-09-07 DIAGNOSIS — R600 Localized edema: Secondary | ICD-10-CM | POA: Diagnosis not present

## 2023-09-07 NOTE — Therapy (Signed)
 OUTPATIENT PHYSICAL THERAPY LOWER EXTREMITY TREATMENT  Patient Name: Luis Knapp MRN: [id number] DOB:1950-05-24, 73 y.o., male Today's Date: 09/07/2023  END OF SESSION:  PT End of Session - 09/07/23 0807     Visit Number 4    Number of Visits 17    Date for PT Re-Evaluation 10/19/23    Authorization Type MCR    Authorization Time Period 08/24/23 to 10/19/23    Progress Note Due on Visit 10    PT Start Time 0801    PT Stop Time 0845    PT Time Calculation (min) 44 min    Activity Tolerance Patient tolerated treatment well    Behavior During Therapy Flat affect                Past Medical History:  Diagnosis Date   Allergic rhinitis due to pollen    Allergy    Arthritis    Constipation 2018   Diverticulosis of colon (without mention of hemorrhage)    ED (erectile dysfunction)    Elevated PSA    GERD (gastroesophageal reflux disease)    History of colon polyps    2008--  PRE CANEROUS/   2013-- ADENOMA   Low back pain 2018   Muscle spasm 2018   Muscle weakness 2018   Post-void dribbling 2018   Prostate cancer (HCC) 11/01/2016   prostate cancer   [name]    Stress incontinence 2018   Unspecified essential hypertension    Urinary retention 2018   Wears glasses    Past Surgical History:  Procedure Laterality Date   COLONOSCOPY     COLONOSCOPY WITH PROPOFOL   10-30-2011  &  2008   POLYPECTOMY   HYDROCELE EXCISION Right 07/18/2013   Procedure: RIGHT HYDROCELECTOMY ADULT;  Surgeon: Andrez Banker, MD;  Location: [facility];  Service: Urology;  Laterality: Right;   LYMPH NODE DISSECTION Bilateral 11/01/2016   Procedure: BILATERAL PELVIC LYMPH NODE DISSECTION;  Surgeon: Andrez Banker, MD;  Location: [facility];  Service: Urology;  Laterality: Bilateral;   POLYPECTOMY     PROSTATE BIOPSY     ROBOT ASSISTED LAPAROSCOPIC RADICAL PROSTATECTOMY N/A 11/01/2016   Procedure: XI ROBOTIC ASSISTED LAPAROSCOPIC RADICAL PROSTATECTOMY;   Surgeon: Andrez Banker, MD;  Location: [facility];  Service: Urology;  Laterality: N/A;   TOTAL KNEE ARTHROPLASTY Left 08/10/2023   Procedure: ARTHROPLASTY, KNEE, TOTAL;  Surgeon: Arnie Lao, MD;  Location: [facility];  Service: Orthopedics;  Laterality: Left;  LEFT TOTAL KNEE ARTHROPLASTY  SPINAL + BLOCK   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   Medicare annual wellness visit, initial 10/18/2015   Hydrocele, right 06/24/2013   Routine general medical examination at a health care facility 12/04/2010   Morbid obesity (HCC) 05/18/2010   SCIATICA 05/18/2010   POLYP, COLON 08/03/2007   Essential hypertension 08/03/2007   Diverticulosis of colon 08/03/2007    PCP: Adra Alanis FNP   REFERRING PROVIDER: Arnie Lao, MD  REFERRING DIAG:  Diagnosis  [phone] (ICD-10-CM) - Status post total left knee replacement  M17.12 (ICD-10-CM) - Unilateral primary osteoarthritis, left knee    THERAPY DIAG:  Acute pain of left knee  Stiffness of left knee, not elsewhere classified  Localized edema  Difficulty in walking,  not elsewhere classified  Unsteadiness on feet  Rationale for Evaluation and Treatment: Rehabilitation  ONSET DATE: surgery 08/10/23  SUBJECTIVE:   SUBJECTIVE STATEMENT:  Doing OK, having more random aches and pains in my knee. Per wife, much more mentally clear- eating and sleeping better   PERTINENT HISTORY: Lt TKA 08/10/23, OA, prostate CA, LBP  PAIN:  Are you having pain? Yes: NPRS scale: today at rest 0/10 with movement 1-2/10  Pain location: surgical left knee  Pain description: post-op pains/random aches  Aggravating factors: everything especially if I push it too hard  Relieving  factors: not to sit for too long   PRECAUTIONS: Fall  RED FLAGS: None   WEIGHT BEARING RESTRICTIONS: No  FALLS:  Has patient fallen in last 6 months? No  LIVING ENVIRONMENT: Lives with: lives with their spouse Lives in: House/apartment Stairs: 2 steps from carport into house, also has concrete ramp about 60ft (pt reports it is too steep) Has following equipment at home: Single point cane, Walker - 2 wheeled, Environmental consultant - 4 wheeled, and Ramped entry  OCCUPATION: retired   PLOF: Independent, Independent with basic ADLs, Independent with gait, and Independent with transfers  PATIENT GOALS: get back to PLOF- walking for exercise, be able to do floor transfers, build stamina, navigate steps and ramp safely   NEXT MD VISIT: May 12th with Dr. Lucienne Ryder  OBJECTIVE:  Note: Objective measures were completed at Evaluation unless otherwise noted.  DIAGNOSTIC FINDINGS: Narrative & Impression CLINICAL DATA:  Status post left knee replacement.   EXAM: PORTABLE LEFT KNEE - 1-2 VIEW   COMPARISON:  None Available.   FINDINGS: Left knee arthroplasty in expected alignment. No periprosthetic lucency or fracture. Recent postsurgical change includes air and edema in the soft tissues and joint space. Anterior skin staples in place.   IMPRESSION: Left knee arthroplasty without immediate postoperative complication.     Electronically Signed   By: Chadwick Colonel M.D.   On: 08/10/2023 18:45  Narrative & Impression CLINICAL DATA:  Prostate carcinoma with biochemical recurrence.   EXAM: NUCLEAR MEDICINE PET SKULL BASE TO THIGH   TECHNIQUE: 7.5 mCi Flotufolastat (Posluma ) was injected intravenously. Full-ring PET imaging was performed from the skull base to thigh after the radiotracer. CT data was obtained and used for attenuation correction and anatomic localization.   COMPARISON:  None Available.   FINDINGS: NECK   No radiotracer activity in neck lymph nodes.   Incidental CT  finding: None.   CHEST   No radiotracer accumulation within mediastinal or hilar lymph nodes. No suspicious pulmonary nodules on the CT scan.   Incidental CT finding: None.   ABDOMEN/PELVIS   Prostate: No focal activity in the prostate bed.   Lymph nodes: There is a focus of radiotracer activity in the presacral space just RIGHT of midline with SUV max equal 4.9 (image 164). There is a subtle soft tissue density measuring approximately 4 mm at this site (image 165/series 4)   The second tiny focus radiotracer activity along the LEFT internal iliac artery in the deep pelvis on image 180 with SUV max equal 3.4. No clear CT correlation.   Liver: No evidence of liver metastasis.   Incidental CT finding: None.   SKELETON   No focal activity to suggest skeletal metastasis.   IMPRESSION: 1. Two tiny foci of radiotracer activity in the pelvis are concerning for prostate cancer metastasis. 2. No evidence of local prostate cancer recurrence in the prostate bed. 3. No evidence of visceral metastasis or skeletal  metastasis.  PATIENT SURVEYS:    Patient-Specific Activity Scoring Scheme  "0" represents "unable to perform." "10" represents "able to perform at prior level. 0 1 2 3 4 5 6 7 8 9  10 (Date and Score)   c LOWER EXTREMITY ROM:  Active ROM Left eval Left 08/30/23 Left 09/07/23  Hip flexion     Hip extension     Hip abduction     Hip adduction     Hip internal rotation     Hip external rotation     Knee flexion 101* supine heel slide  Supine heel slide 105* Supine heel slide 108*  Knee extension 7* supine quad set with heel prop   6* supine quad set with heel prop   Ankle dorsiflexion     Ankle plantarflexion     Ankle inversion     Ankle eversion      (Blank rows = not tested)  LOWER EXTREMITY MMT:  MMT Left eval  Hip flexion 3+  Hip extension   Hip abduction   Hip adduction   Hip internal rotation   Hip external rotation   Knee flexion   Knee  extension 4  Ankle dorsiflexion 5  Ankle plantarflexion   Ankle inversion   Ankle eversion    (Blank rows = not tested)  FUNCTIONAL TESTS:  08/24/2023 Evaluation:  5 times sit to stand: 47 seconds no UEs  Timed up and go (TUG): 33 seconds 4WW   GAIT: 08/24/2023 Evaluation:  Distance walked: in clinic distances  Assistive device utilized: Environmental consultant - 4 wheeled Level of assistance: Modified independence Comments: slow gait pattern, intermittent heel toe, very slow pace of pain, some toe out B   TODAY'S TREATMENT                                                                          DATE:   09/07/23   Precor bike seat 6 needed a couple of minutes of partially rotations, then able to work up to full x10 minutes Shuttle LE press double leg: 62# x10 with focus on holds in end range extension and flexion, eccentric control  Bridges with progressive knee flexion 3x5  Seated HS stretches L LE 3x30 seconds strap  Knee extension stretch 0# on stool x2 minutes   Retrograde massage LEs elevated + ankle pumps Percussion gun to L quad with towel for padding  Encouraged regular ice/elevation at home on a daily basis         08/30/2023 Therapeutic Exercise: Precor recumbent bike seat 7 rock for flex stretch 2.5 min, backwards 1.5 min, then full revolutions level 1 for 5 min. Gastroc stretch step heel depression 30 sec hold 3 reps ea LE LLE Hamstring stretch seated with strap assist 30 second hold 3 reps Quad stretch with LLE over edge of bed strap assisted knee flexion 30 second hold 3 reps LLE supine heel slide with ball & strap in BUEs rolling ball for knee flexion 5 sec hold and roll out 5 sec leg press / quad set. 10 reps 2 sets.  Vaso knee with elevation 10 min medium compression 34*  TREATMENT  DATE: 08/30/2023 Therapeutic Exercise: Precor recumbent bike seat 6 rock for flex stretch 3 min, backwards 1 min,  then full revolutions level 1 for 4 min. LLE  heel slide with foot on pillow case (decrease resistance) knee ext / quad set 5 sec and active knee flexion with RLE assisted end range 5 sec hamstring set 10 reps 2 sets.  LLE supine heel slide with ball & strap in BUEs rolling ball for knee flexion 5 sec hold and roll out 5 sec leg press / quad set. 10 reps 2 sets. Pt & wife verbalized improvement to how he has been doing it at home.   Gait Training: PT adjusted pt's rollator walker and tightened handles.  PT demo & verbal cues on use of rollator walker to pt & wife.  Pt amb with safer and more fluent form.  He reported felt more comfortable.   Self-Care: PT demo & verbal cues on elevation with LE higher than heart >/= 2x/day for >/= 15 min.    Vaso knee with elevation 10 min medium compression 34*                                                                                                                               TREATMENT DATE:  08/24/23  Eval, care planning, education on general recovery from TKR including continuing to work on hospital and HHPT HEPs, activity tolerance impairments following surgery, PT expectations and anticipated course of care, discussed concerns over lack of sleep with spouse and educated that we will monitor  and reach out to MD for further assistance managing this PRN    PATIENT EDUCATION:  Education details: see above  Person educated: Patient and Spouse Education method: Explanation and Verbal cues Education comprehension: verbalized understanding, returned demonstration, and needs further education  HOME EXERCISE PROGRAM: Access Code: [email or web address] URL: [email or web address] Date: 09/03/2023 Prepared by: Lorie Rook  Exercises - Ankle Alphabet in Elevation  - 2-3 x daily - 7 x weekly - 1 sets - 1-3 reps - 15 minutes hold - Seated Knee Flexion Extension AROM   - 1-3 x daily - 7 x weekly - 1-3 sets - 10 reps - 5 seconds hold - Supine Heel  Slide with Strap  - 1-3 x daily - 7 x weekly - 1-3 sets - 10 reps - 5 seconds hold - Gastroc Stretch on Step  - 1-3 x daily - 7 x weekly - 1 sets - 3 reps - 30 seconds hold - Seated Hamstring Stretch with Strap  - 1-3 x daily - 7 x weekly - 1 sets - 3 reps - 30 seconds hold - Supine Quadriceps Stretch with Strap on Table  - 1-3 x daily - 7 x weekly - 1 sets - 3 reps - 30 seconds hold    ASSESSMENT:  CLINICAL IMPRESSION:  Pt arrives today doing well, wife reports that he is much more mentally clear as compared to eval day, sleeping  and eating much better.  Continued working on ROM and strength today as appropriate, also educated on some edema management strategies that he and his wife can use at home. Encouraged regular ice/elevation at home on top of HEP. Continue POC.   OBJECTIVE IMPAIRMENTS: Abnormal gait, decreased activity tolerance, decreased balance, decreased cognition, decreased knowledge of use of DME, decreased mobility, difficulty walking, decreased ROM, decreased strength, increased edema, increased fascial restrictions, impaired flexibility, obesity, and pain.   ACTIVITY LIMITATIONS: standing, squatting, stairs, transfers, bed mobility, and locomotion level  PARTICIPATION LIMITATIONS: driving, shopping, community activity, occupation, and yard work  PERSONAL FACTORS: Age, Education, Fitness, Past/current experiences, Profession, and Time since onset of injury/illness/exacerbation are also affecting patient's functional outcome.   REHAB POTENTIAL: Good  CLINICAL DECISION MAKING: Evolving/moderate complexity  EVALUATION COMPLEXITY: Moderate   GOALS: Goals reviewed with patient? No  SHORT TERM GOALS: Target date: 09/21/2023    Will be compliant with appropriate progressive HEP Goal status: Ongoing   08/30/2023   2. L knee AROM flexion to be at least 115* and extension AROM to be no more than 3*  Goal status: iOngoing   08/30/2023   3. Will be independent with edema  management strategies  Goal status: Ongoing   08/30/2023   4. Gait pattern to have normalized without LRAD  Goal status: Ongoing   08/30/2023   LONG TERM GOALS: Target date: 10/19/2023    MMT to have improved by one grade all weak groups Goal status: Ongoing   08/30/2023   2. Will be able to perform floor to stand transfers without difficulty with no more than S  Goal status:Ongoing   08/30/2023   3. Will be able to ascend and descend stairs and ramp reciprocally with no increase in pain Goal status: Ongoing   08/30/2023   4. Will be able to ambulate community distances and perform all household tasks with no increase in pain  Goal status: Ongoing   08/30/2023   5. PSFS to have improved by at least 3 points to show improved QOL and subjective improvement  Goal status: Ongoing   08/30/2023  6. Will be able to walk for exercise as desired without increase from resting pain levels Goal status: Ongoing   08/30/2023    PLAN:  PT FREQUENCY: 2x/week  PT DURATION: 8 weeks  PLANNED INTERVENTIONS: 97164- PT Re-evaluation, 97110-Therapeutic exercises, 97530- Therapeutic activity, 97112- Neuromuscular re-education, 97535- Self Care, 16109- Manual therapy, [phone]- Gait training, [phone]- Electrical stimulation (unattended), [phone]- Ionotophoresis 4mg /ml Dexamethasone , Stair training, Taping, Dry Needling, Joint mobilization, and DME instructions  PLAN FOR NEXT SESSION: Continue with manual therapy & exercises for ROM, + standing balance activities to program.  Edema management, gait, balance, strength; weekly ROM checks    Terrel Ferries, PT, DPT 09/07/23 8:52 AM

## 2023-09-07 NOTE — Progress Notes (Signed)
   Joanna Muck, PhD, LAT, ATC acting as a scribe for Garlan Juniper, MD.  Luis Knapp is a 73 y.o. male who presents to [facility] today for cont'd bilat knee pain. Pt was last seen by Dr. Alease Hunter on 05/21/23 and completed the Gelsyn series, 3/3, bilaterally.  Today, pt reports he is wanting to have his R knee replaced. R knee is currently painful, but mechanical symptoms noted. He underwent a L TKR on 08/10/23 w/ Dr. Lucienne Ryder. He is thinking about having a R TKR sometime in the next year. He struggled post-op and had a reaction to the dilaudid  after no relief from oxycodone . Making his post-op pain difficult to manage.  Patient had what sounds like delirium as a result of not sleeping well in the hospital and Dilaudid .   Pertinent review of systems: No fevers or chills  Relevant historical information: Hypertension, history of prostate cancer, left total knee replacement.   Exam:  BP (!) 146/82   Pulse 89   Ht 5\' 6"  (1.676 m)   Wt 224 lb (101.6 kg)   SpO2 98%   BMI 36.15 kg/m  General: Well Developed, well nourished, and in no acute distress.   MSK: Right knee mild effusion normal-appearing mild antalgic gait.    Lab and Radiology Results  X-ray images right knee obtained today personally and independently interpreted. Severe medial compartment DJD.  No acute fractures. Await formal radiology review     Assessment and Plan: 73 y.o. male with chronic right knee pain.  Doing okay.  He had a left total knee replacement about a month ago.  His hospitalization was complicated by delirium probably from Dilaudid .  We talked about what to do with his right knee.  If it hurts bad enough we can move to [place] .  Right now it is not bad enough but he will let me know.  He is targeting the right knee replacement potentially for next year.  He will keep me updated.  I do not see the need for an MRI he has enough arthritis to explain his pain.   PDMP  not reviewed this encounter. Orders Placed This Encounter  Procedures   DG Knee AP/LAT W/[place] Right    Standing Status:   Future    Number of Occurrences:   1    Expiration Date:   10/10/2023    Reason for Exam (SYMPTOM  OR DIAGNOSIS REQUIRED):   right knee pain    Preferred imaging location?:   [facility]   No orders of the defined types were placed in this encounter.    Discussed warning signs or symptoms. Please see discharge instructions. Patient expresses understanding.   The above documentation has been reviewed and is accurate and complete Garlan Juniper, M.D.

## 2023-09-10 ENCOUNTER — Ambulatory Visit (INDEPENDENT_AMBULATORY_CARE_PROVIDER_SITE_OTHER)

## 2023-09-10 ENCOUNTER — Ambulatory Visit (INDEPENDENT_AMBULATORY_CARE_PROVIDER_SITE_OTHER): Admitting: Family Medicine

## 2023-09-10 VITALS — BP 146/82 | HR 89 | Ht 66.0 in | Wt 224.0 lb

## 2023-09-10 DIAGNOSIS — G8929 Other chronic pain: Secondary | ICD-10-CM

## 2023-09-10 DIAGNOSIS — M25561 Pain in right knee: Secondary | ICD-10-CM

## 2023-09-10 DIAGNOSIS — M1711 Unilateral primary osteoarthritis, right knee: Secondary | ICD-10-CM | POA: Diagnosis not present

## 2023-09-10 DIAGNOSIS — M25461 Effusion, right knee: Secondary | ICD-10-CM | POA: Diagnosis not present

## 2023-09-10 NOTE — Patient Instructions (Addendum)
 Thank you for coming in today.   Please get an Xray today before you leave   Let me know if you want to do [place]  or MRI for your right knee.

## 2023-09-12 ENCOUNTER — Ambulatory Visit

## 2023-09-12 ENCOUNTER — Encounter: Payer: Self-pay | Admitting: Family

## 2023-09-12 ENCOUNTER — Ambulatory Visit (INDEPENDENT_AMBULATORY_CARE_PROVIDER_SITE_OTHER): Admitting: Rehabilitative and Restorative Service Providers"

## 2023-09-12 ENCOUNTER — Encounter: Payer: Self-pay | Admitting: Rehabilitative and Restorative Service Providers"

## 2023-09-12 VITALS — BP 146/82 | Ht 66.0 in | Wt 219.0 lb

## 2023-09-12 DIAGNOSIS — R600 Localized edema: Secondary | ICD-10-CM

## 2023-09-12 DIAGNOSIS — R262 Difficulty in walking, not elsewhere classified: Secondary | ICD-10-CM

## 2023-09-12 DIAGNOSIS — M25562 Pain in left knee: Secondary | ICD-10-CM

## 2023-09-12 DIAGNOSIS — M25662 Stiffness of left knee, not elsewhere classified: Secondary | ICD-10-CM | POA: Diagnosis not present

## 2023-09-12 DIAGNOSIS — R2681 Unsteadiness on feet: Secondary | ICD-10-CM

## 2023-09-12 DIAGNOSIS — Z2821 Immunization not carried out because of patient refusal: Secondary | ICD-10-CM | POA: Diagnosis not present

## 2023-09-12 DIAGNOSIS — Z0000 Encounter for general adult medical examination without abnormal findings: Secondary | ICD-10-CM

## 2023-09-12 NOTE — Therapy (Signed)
 OUTPATIENT PHYSICAL THERAPY LOWER EXTREMITY TREATMENT  Patient Name: Luis Knapp MRN: [id number] DOB:[birth year], 73 y.o., male Today's Date: 09/12/2023  END OF SESSION:  PT End of Session - 09/12/23 0949     Visit Number 5    Number of Visits 17    Date for PT Re-Evaluation 10/19/23    Authorization Type MCR    Authorization Time Period 08/24/23 to 10/19/23    Progress Note Due on Visit 10    PT Start Time 0932    PT Stop Time 1024    PT Time Calculation (min) 52 min    Activity Tolerance Patient tolerated treatment well;No increased pain;Patient limited by pain    Behavior During Therapy [facility] for tasks assessed/performed              Past Medical History:  Diagnosis Date   Allergic rhinitis due to pollen    Allergy    Arthritis    Constipation 2018   Diverticulosis of colon (without mention of hemorrhage)    ED (erectile dysfunction)    Elevated PSA    GERD (gastroesophageal reflux disease)    History of colon polyps    2008--  PRE CANEROUS/   2013-- ADENOMA   Low back pain 2018   Muscle spasm 2018   Muscle weakness 2018   Post-void dribbling 2018   Prostate cancer (HCC) 11/01/2016   prostate cancer   [name]    Stress incontinence 2018   Unspecified essential hypertension    Urinary retention 2018   Wears glasses    Past Surgical History:  Procedure Laterality Date   COLONOSCOPY     COLONOSCOPY WITH PROPOFOL   10-30-2011  &  2008   POLYPECTOMY   HYDROCELE EXCISION Right 07/18/2013   Procedure: RIGHT HYDROCELECTOMY ADULT;  Surgeon: Andrez Banker, MD;  Location: [facility];  Service: Urology;  Laterality: Right;   LYMPH NODE DISSECTION Bilateral 11/01/2016   Procedure: BILATERAL PELVIC LYMPH NODE DISSECTION;  Surgeon: Andrez Banker, MD;  Location: [facility];  Service: Urology;  Laterality: Bilateral;   POLYPECTOMY     PROSTATE BIOPSY     ROBOT ASSISTED LAPAROSCOPIC RADICAL PROSTATECTOMY N/A 11/01/2016   Procedure: XI  ROBOTIC ASSISTED LAPAROSCOPIC RADICAL PROSTATECTOMY;  Surgeon: Andrez Banker, MD;  Location: [facility];  Service: Urology;  Laterality: N/A;   TOTAL KNEE ARTHROPLASTY Left 08/10/2023   Procedure: ARTHROPLASTY, KNEE, TOTAL;  Surgeon: Arnie Lao, MD;  Location: [facility];  Service: Orthopedics;  Laterality: Left;  LEFT TOTAL KNEE ARTHROPLASTY  SPINAL + BLOCK   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   Medicare annual wellness visit, initial 10/18/2015   Hydrocele, right 06/24/2013   Routine general medical examination at a health care facility 12/04/2010   Morbid obesity (HCC) 05/18/2010   SCIATICA 05/18/2010   POLYP, COLON 08/03/2007   Essential hypertension 08/03/2007   Diverticulosis of colon 08/03/2007    PCP: Adra Alanis FNP   REFERRING PROVIDER: Arnie Lao, MD  REFERRING DIAG:  Diagnosis  [phone] (ICD-10-CM) - Status post total left knee replacement  M17.12 (ICD-10-CM) - Unilateral primary osteoarthritis, left knee    THERAPY DIAG:  Acute pain of left knee  Stiffness of left knee, not elsewhere classified  Localized  edema  Difficulty in walking, not elsewhere classified  Unsteadiness on feet  Rationale for Evaluation and Treatment: Rehabilitation  ONSET DATE: surgery 08/10/23  SUBJECTIVE:   SUBJECTIVE STATEMENT: Sleep is interrupted (2-2.5 hours).  He has moved to the bed, although he still needs the recliner.  PERTINENT HISTORY: Lt TKA 08/10/23, OA, prostate CA, LBP  PAIN:  Are you having pain? Yes: NPRS scale: 0-4/10 this week Pain location: surgical left knee  Pain description: post-op pains/random aches  Aggravating factors: everything especially if I push it too hard   Relieving factors: Tylenol , ibuprofen  PRECAUTIONS: Fall  RED FLAGS: None   WEIGHT BEARING RESTRICTIONS: No  FALLS:  Has patient fallen in last 6 months? No  LIVING ENVIRONMENT: Lives with: lives with their spouse Lives in: House/apartment Stairs: 2 steps from carport into house, also has concrete ramp about 44ft (pt reports it is too steep) Has following equipment at home: Single point cane, Walker - 2 wheeled, Environmental consultant - 4 wheeled, and Ramped entry  OCCUPATION: retired   PLOF: Independent, Independent with basic ADLs, Independent with gait, and Independent with transfers  PATIENT GOALS: get back to PLOF- walking for exercise, be able to do floor transfers, build stamina, navigate steps and ramp safely   NEXT MD VISIT: May 12th with Dr. Lucienne Ryder  OBJECTIVE:  Note: Objective measures were completed at Evaluation unless otherwise noted.  DIAGNOSTIC FINDINGS: Narrative & Impression CLINICAL DATA:  Status post left knee replacement.   EXAM: PORTABLE LEFT KNEE - 1-2 VIEW   COMPARISON:  None Available.   FINDINGS: Left knee arthroplasty in expected alignment. No periprosthetic lucency or fracture. Recent postsurgical change includes air and edema in the soft tissues and joint space. Anterior skin staples in place.   IMPRESSION: Left knee arthroplasty without immediate postoperative complication.     Electronically Signed   By: Chadwick Colonel M.D.   On: 08/10/2023 18:45  Narrative & Impression CLINICAL DATA:  Prostate carcinoma with biochemical recurrence.   EXAM: NUCLEAR MEDICINE PET SKULL BASE TO THIGH   TECHNIQUE: 7.5 mCi Flotufolastat (Posluma ) was injected intravenously. Full-ring PET imaging was performed from the skull base to thigh after the radiotracer. CT data was obtained and used for attenuation correction and anatomic localization.   COMPARISON:  None Available.   FINDINGS: NECK   No radiotracer activity in neck lymph nodes.   Incidental  CT finding: None.   CHEST   No radiotracer accumulation within mediastinal or hilar lymph nodes. No suspicious pulmonary nodules on the CT scan.   Incidental CT finding: None.   ABDOMEN/PELVIS   Prostate: No focal activity in the prostate bed.   Lymph nodes: There is a focus of radiotracer activity in the presacral space just RIGHT of midline with SUV max equal 4.9 (image 164). There is a subtle soft tissue density measuring approximately 4 mm at this site (image 165/series 4)   The second tiny focus radiotracer activity along the LEFT internal iliac artery in the deep pelvis on image 180 with SUV max equal 3.4. No clear CT correlation.   Liver: No evidence of liver metastasis.   Incidental CT finding: None.   SKELETON   No focal activity to suggest skeletal metastasis.   IMPRESSION: 1. Two tiny foci of radiotracer activity in the pelvis are concerning for prostate cancer metastasis. 2. No evidence of local prostate cancer recurrence in the prostate bed. 3. No evidence of visceral metastasis or skeletal metastasis.  PATIENT SURVEYS:    Patient-Specific Activity Scoring  Scheme  "0" represents "unable to perform." "10" represents "able to perform at prior level. 0 1 2 3 4 5 6 7 8 9  10 (Date and Score)   c LOWER EXTREMITY ROM:  Active ROM Left eval Left 08/30/23 Left 09/07/23 Left 09/12/2023  Hip flexion      Hip extension      Hip abduction      Hip adduction      Hip internal rotation      Hip external rotation      Knee flexion 101* supine heel slide  Supine heel slide 105* Supine heel slide 108* 105 supine  Knee extension 7* supine quad set with heel prop   6* supine quad set with heel prop  Lacking 3 degrees (-3*)  Ankle dorsiflexion      Ankle plantarflexion      Ankle inversion      Ankle eversion       (Blank rows = not tested)  LOWER EXTREMITY MMT:  MMT Left eval  Hip flexion 3+  Hip extension   Hip abduction   Hip adduction   Hip internal  rotation   Hip external rotation   Knee flexion   Knee extension 4  Ankle dorsiflexion 5  Ankle plantarflexion   Ankle inversion   Ankle eversion    (Blank rows = not tested)  FUNCTIONAL TESTS:  08/24/2023 Evaluation:  5 times sit to stand: 47 seconds no UEs  Timed up and go (TUG): 33 seconds 4WW   GAIT: 08/24/2023 Evaluation:  Distance walked: in clinic distances  Assistive device utilized: Environmental consultant - 4 wheeled Level of assistance: Modified independence Comments: slow gait pattern, intermittent heel toe, very slow pace of pain, some toe out B   TODAY'S TREATMENT                                                                          DATE:  09/12/2023 Upper heel cords box Supine hamstrings stretch with ankle dorsiflexion 4 x 20 seconds Prone quadriceps stretch 4 x 20 seconds Quadriceps sets 10 x 5 seconds Seated straight leg raises 2 sets of 5 for 3 seconds  Functional Activities: Double Leg Press 100# slow eccentrics 15 x  Single Leg Press 50# 10 X  Vaso left knee high 34* 10 minutes   09/07/23 Precor bike seat 6 needed a couple of minutes of partially rotations, then able to work up to full x10 minutes Shuttle LE press double leg: 62# x10 with focus on holds in end range extension and flexion, eccentric control  Bridges with progressive knee flexion 3x5  Seated HS stretches L LE 3x30 seconds strap  Knee extension stretch 0# on stool x2 minutes   Retrograde massage LEs elevated + ankle pumps Percussion gun to L quad with towel for padding  Encouraged regular ice/elevation at home on a daily basis    08/30/2023 Therapeutic Exercise: Precor recumbent bike seat 7 rock for flex stretch 2.5 min, backwards 1.5 min, then full revolutions level 1 for 5 min. Gastroc stretch step heel depression 30 sec hold 3 reps ea LE LLE Hamstring stretch seated with strap assist 30 second hold 3 reps Quad stretch with LLE over edge of bed strap assisted  knee flexion 30 second hold 3  reps LLE supine heel slide with ball & strap in BUEs rolling ball for knee flexion 5 sec hold and roll out 5 sec leg press / quad set. 10 reps 2 sets.  Vaso knee with elevation 10 min medium compression 34*   PATIENT EDUCATION:  Education details: see above  Person educated: Patient and Spouse Education method: Explanation and Verbal cues Education comprehension: verbalized understanding, returned demonstration, and needs further education  HOME EXERCISE PROGRAM: Access Code: [email or web address] URL: [email or web address] Date: 09/12/2023 Prepared by: Terral Ferrari  Exercises - Ankle Alphabet in Elevation  - 2-3 x daily - 7 x weekly - 1 sets - 1-3 reps - 15 minutes hold - Seated Knee Flexion Extension AROM   - 1-3 x daily - 7 x weekly - 1-3 sets - 10 reps - 5 seconds hold - Supine Heel Slide with Strap  - 1-3 x daily - 7 x weekly - 1-3 sets - 10 reps - 5 seconds hold - Gastroc Stretch on Step  - 1-3 x daily - 7 x weekly - 1 sets - 3 reps - 30 seconds hold - Seated Hamstring Stretch with Strap  - 1-3 x daily - 7 x weekly - 1 sets - 3 reps - 30 seconds hold - Supine Quadriceps Stretch with Strap on Table  - 1-3 x daily - 7 x weekly - 1 sets - 3 reps - 30 seconds hold - Small Range Straight Leg Raise  - 3 x daily - 7 x weekly - 2-3 sets - 5 reps - 3 seconds hold - Supine Hamstring Stretch with Strap  - 1 x daily - 7 x weekly - 1 sets - 4-5 reps - 20 seconds hold    ASSESSMENT:  CLINICAL IMPRESSION: AROM was assessed at 0 - 3 -105 degrees today.  The 3 degrees of extension he is lacking is likely due to edema and extension active range of motion should not be a problem long-term.  Quadriceps strengthening, balance and more functional activities will be a higher priority on future visits as AROM appears to be progressing well.  Continue Severin's current plan of care to address all long-term goals.  OBJECTIVE IMPAIRMENTS: Abnormal gait, decreased activity tolerance, decreased balance,  decreased cognition, decreased knowledge of use of DME, decreased mobility, difficulty walking, decreased ROM, decreased strength, increased edema, increased fascial restrictions, impaired flexibility, obesity, and pain.   ACTIVITY LIMITATIONS: standing, squatting, stairs, transfers, bed mobility, and locomotion level  PARTICIPATION LIMITATIONS: driving, shopping, community activity, occupation, and yard work  PERSONAL FACTORS: Age, Education, Fitness, Past/current experiences, Profession, and Time since onset of injury/illness/exacerbation are also affecting patient's functional outcome.   REHAB POTENTIAL: Good  CLINICAL DECISION MAKING: Evolving/moderate complexity  EVALUATION COMPLEXITY: Moderate   GOALS: Goals reviewed with patient? No  SHORT TERM GOALS: Target date: 09/21/2023    Will be compliant with appropriate progressive HEP Goal status: Met 09/12/2023  2. L knee AROM flexion to be at least 115* and extension AROM to be no more than 3*  Goal status: Met 09/12/2023  3. Will be independent with edema management strategies  Goal status: Ongoing   09/12/2023   4. Gait pattern to have normalized without LRAD  Goal status: Ongoing   09/12/2023   LONG TERM GOALS: Target date: 10/19/2023    MMT to have improved by one grade all weak groups Goal status: Ongoing   08/30/2023   2. Will be able to perform floor to stand transfers  without difficulty with no more than S  Goal status:Ongoing   08/30/2023   3. Will be able to ascend and descend stairs and ramp reciprocally with no increase in pain Goal status: Ongoing   09/12/2023   4. Will be able to ambulate community distances and perform all household tasks with no increase in pain  Goal status: Ongoing   09/12/2023   5. PSFS to have improved by at least 3 points to show improved QOL and subjective improvement  Goal status: Ongoing   08/30/2023  6. Will be able to walk for exercise as desired without increase from resting  pain levels Goal status: Ongoing   09/12/2023    PLAN:  PT FREQUENCY: 2x/week  PT DURATION: 8 weeks  PLANNED INTERVENTIONS: 97164- PT Re-evaluation, 97110-Therapeutic exercises, 97530- Therapeutic activity, 97112- Neuromuscular re-education, 97535- Self Care, 16109- Manual therapy, [id number]- Gait training, [phone]- Electrical stimulation (unattended), [phone]- Ionotophoresis 4mg /ml Dexamethasone , Stair training, Taping, Dry Needling, Joint mobilization, and DME instructions  PLAN FOR NEXT SESSION: Okay to progress quadriceps and functional strengthening along with balance and proprioceptive activities.  Joli Neas PT MPT 09/12/23 5:05 PM

## 2023-09-12 NOTE — Progress Notes (Signed)
 Because this visit was a virtual/telehealth visit,  certain criteria was not obtained, such a blood pressure, CBG if applicable, and timed get up and go. Any medications not marked as "taking" were not mentioned during the medication reconciliation part of the visit. Any vitals not documented were not able to be obtained due to this being a telehealth visit or patient was unable to self-report a recent blood pressure reading due to a lack of equipment at home via telehealth. Vitals that have been documented are verbally provided by the patient.  This visit was performed by a medical professional under my direct supervision. I was immediately available for consultation/collaboration. I have reviewed and agree with the Annual Wellness Visit documentation.  Subjective:   Luis Knapp is a 73 y.o. who presents for a Medicare Wellness preventive visit.  Visit Complete: Virtual I connected with  Samson Croak on 09/12/23 by a video and audio enabled telemedicine application and verified that I am speaking with the correct person using two identifiers.  Patient Location: Home  Provider Location: Home Office  I discussed the limitations of evaluation and management by telemedicine. The patient expressed understanding and agreed to proceed.  Vital Signs: Because this visit was a virtual/telehealth visit, some criteria may be missing or patient reported. Any vitals not documented were not able to be obtained and vitals that have been documented are patient reported.  VideoDeclined- This patient declined Librarian, academic. Therefore the visit was completed with audio only.  Persons Participating in Visit: Patient.  AWV Questionnaire: No: Patient Medicare AWV questionnaire was not completed prior to this visit.  Cardiac Risk Factors include: advanced age (>77men, >45 women);male gender;hypertension;obesity (BMI >30kg/m2)     Objective:    Today's Vitals   09/12/23  1449  BP: (!) 146/82  Weight: 219 lb (99.3 kg)  Height: 5\' 6"  (1.676 m)   Body mass index is 35.35 kg/m.     09/12/2023    2:58 PM 08/24/2023   11:05 AM 08/10/2023    6:05 PM 08/03/2023    7:53 AM 06/11/2023    9:39 AM 09/07/2022    8:22 AM 11/22/2021   11:00 AM  Advanced Directives  Does Patient Have a Medical Advance Directive? Yes Yes No Yes Yes Yes Yes  Type of Advance Directive Living will;Healthcare Power of Attorney Living will;Healthcare Power of Attorney Living will;Healthcare Power of Attorney Living will;Healthcare Power of [employer] Power of [employer] Power of [place];Living will Healthcare Power of [place];Living will  Does patient want to make changes to medical advance directive? No - Patient declined No - Patient declined    No - Patient declined No - Patient declined  Copy of Healthcare Power of Attorney in Chart? No - copy requested No - copy requested    No - copy requested No - copy requested  Would patient like information on creating a medical advance directive? No - Patient declined No - Patient declined No - Patient declined        Current Medications (verified) Outpatient Encounter Medications as of 09/12/2023  Medication Sig   allopurinol  (ZYLOPRIM ) 300 MG tablet TAKE 1 TABLET BY MOUTH DAILY   amLODipine  (NORVASC ) 5 MG tablet Take 1 tablet (5 mg total) by mouth at bedtime.   Cholecalciferol  (VITAMIN D -3) 1000 units CAPS Take 1,000 Units by mouth daily.   Coenzyme Q10 (COQ-10) 100 MG CAPS Take 100 mg by mouth 2 (two) times daily.   rosuvastatin  (CRESTOR ) 10 MG tablet Take 1  tablet (10 mg total) by mouth daily.   tiZANidine  (ZANAFLEX ) 2 MG tablet Take 1 tablet (2 mg total) by mouth every 6 (six) hours as needed for muscle spasms.   valsartan -hydrochlorothiazide  (DIOVAN -HCT) 320-25 MG tablet Take 1 tablet by mouth daily.   No facility-administered encounter medications on file as of 09/12/2023.    Allergies (verified) Dilaudid   [hydromorphone ]   History: Past Medical History:  Diagnosis Date   Allergic rhinitis due to pollen    Allergy    Arthritis    Constipation 2018   Diverticulosis of colon (without mention of hemorrhage)    ED (erectile dysfunction)    Elevated PSA    GERD (gastroesophageal reflux disease)    History of colon polyps    2008--  PRE CANEROUS/   2013-- ADENOMA   Low back pain 2018   Muscle spasm 2018   Muscle weakness 2018   Post-void dribbling 2018   Prostate cancer (HCC) 11/01/2016   prostate cancer   [name]    Stress incontinence 2018   Unspecified essential hypertension    Urinary retention 2018   Wears glasses    Past Surgical History:  Procedure Laterality Date   COLONOSCOPY     COLONOSCOPY WITH PROPOFOL   10-30-2011  &  2008   POLYPECTOMY   HYDROCELE EXCISION Right 07/18/2013   Procedure: RIGHT HYDROCELECTOMY ADULT;  Surgeon: Andrez Banker, MD;  Location: [facility];  Service: Urology;  Laterality: Right;   LYMPH NODE DISSECTION Bilateral 11/01/2016   Procedure: BILATERAL PELVIC LYMPH NODE DISSECTION;  Surgeon: Andrez Banker, MD;  Location: [facility];  Service: Urology;  Laterality: Bilateral;   POLYPECTOMY     PROSTATE BIOPSY     ROBOT ASSISTED LAPAROSCOPIC RADICAL PROSTATECTOMY N/A 11/01/2016   Procedure: XI ROBOTIC ASSISTED LAPAROSCOPIC RADICAL PROSTATECTOMY;  Surgeon: Andrez Banker, MD;  Location: [facility];  Service: Urology;  Laterality: N/A;   TOTAL KNEE ARTHROPLASTY Left 08/10/2023   Procedure: ARTHROPLASTY, KNEE, TOTAL;  Surgeon: Arnie Lao, MD;  Location: [facility];  Service: Orthopedics;  Laterality: Left;  LEFT TOTAL KNEE ARTHROPLASTY  SPINAL + BLOCK   Family History  Problem Relation Age of Onset   Coronary artery disease Father    Heart attack Father        2nd one fatal   Pulmonary embolism Father        ?   Dementia Father    Heart disease Father    Colon cancer Father    Dementia Mother     Hypertension Mother    Macular degeneration Mother    Kidney disease Mother    Diabetes Sister    Non-Hodgkin's lymphoma Sister    Colon cancer Maternal Grandfather    Pancreatic cancer Sister    Prostate cancer Neg Hx    Rectal cancer Neg Hx    Colon polyps Neg Hx    Esophageal cancer Neg Hx    Social History   Socioeconomic History   Marital status: Married    Spouse name: Not on file   Number of children: 2   Years of education: Not on file   Highest education level: 12th grade  Occupational History   Occupation: Retired - Probation officer: [employer]    Comment: [employer], trained as   Occupation: Catering manager firm    Comment: Printmaker  Tobacco Use   Smoking status: Former    Current packs/day: 0.00    Types: Cigarettes  Quit date: 10/17/1995    Years since quitting: 27.9   Smokeless tobacco: Never  Vaping Use   Vaping status: Never Used  Substance and Sexual Activity   Alcohol use: Not Currently   Drug use: No   Sexual activity: Yes    Comment: with aid of SIldenafil and VED  Other Topics Concern   Not on file  Social History Narrative   Married '77- 19 years divorced; married '02. 2 sons - '79, '80; 1 grandson. Things are ok. Work is OK - Quarry manager.    Fun/Hobby: Works at [employer] on a regular basis   Former Smoker, quit in 1997   Social Drivers of [employer]: Low Risk  (09/12/2023)   Overall Financial Resource Strain (CARDIA)    Difficulty of Paying Living Expenses: Not hard at all  Food Insecurity: No Food Insecurity (09/12/2023)   Hunger Vital Sign    Worried About Running Out of Food in the Last Year: Never true    Ran Out of Food in the Last Year: Never true  Transportation Needs: No Transportation Needs (09/12/2023)   PRAPARE - Administrator, Civil Service (Medical): No    Lack of Transportation (Non-Medical): No  Physical Activity: Sufficiently Active (09/12/2023)    Exercise Vital Sign    Days of Exercise per Week: 5 days    Minutes of Exercise per Session: 30 min  Recent Concern: Physical Activity - Inactive (08/28/2023)   Exercise Vital Sign    Days of Exercise per Week: 0 days    Minutes of Exercise per Session: 30 min  Stress: Stress Concern Present (09/12/2023)   [employer] of Occupational Health - Occupational Stress Questionnaire    Feeling of Stress : Very much  Social Connections: Moderately Isolated (09/12/2023)   Social Connection and Isolation Panel [NHANES]    Frequency of Communication with Friends and Family: Once a week    Frequency of Social Gatherings with Friends and Family: Never    Attends Religious Services: Never    Diplomatic Services operational officer: No    Attends Engineer, structural: More than 4 times per year    Marital Status: Married    Tobacco Counseling Counseling given: Not Answered    Clinical Intake:  Pre-visit preparation completed: Yes  Pain : No/denies pain     BMI - recorded: 35.35 Nutritional Status: BMI > 30  Obese Nutritional Risks: None Diabetes: No  No results found for: "HGBA1C"   How often do you need to have someone help you when you read instructions, pamphlets, or other written materials from your doctor or pharmacy?: 1 - Never What is the last grade level you completed in school?: Some college     Information entered by :: [employer]   Activities of Daily Living     09/12/2023    2:56 PM 08/10/2023    6:05 PM  In your present state of health, do you have any difficulty performing the following activities:  Hearing? 0   Vision? 0   Difficulty concentrating or making decisions? 0   Walking or climbing stairs? 0   Dressing or bathing? 0   Doing errands, shopping? 0 0  Preparing Food and eating ? N   Using the Toilet? N   In the past six months, have you accidently leaked urine? N   Do you have problems with loss of bowel control? N   Managing  your Medications? N  Managing your Finances? N   Housekeeping or managing your Housekeeping? Y   Comment yes after his recent surgery     Patient Care Team: Adra Alanis, FNP as PCP - General (Internal Medicine) Andrez Banker, MD as Attending Physician (Urology) Kenith Payer, MD as Consulting Physician (Radiation Oncology) Syliva Even, MD as Consulting Physician (Family Medicine) Katheleen Palmer, RN as Oncology Nurse Navigator  Indicate any recent Medical Services you may have received from other than [facility] providers in the past year (date may be approximate).     Assessment:   This is a routine wellness examination for [place].  Hearing/Vision screen Hearing Screening - Comments:: No hearing difficulties  Vision Screening - Comments:: Wears glasses   Goals Addressed             This Visit's Progress    Patient Stated       Wants to get better soon after knee replacement        Depression Screen     09/12/2023    2:59 PM 06/11/2023    9:48 AM 11/28/2022    8:11 AM 09/07/2022    8:24 AM 07/18/2022    8:12 AM 03/02/2022    8:18 AM 09/05/2021    4:03 PM  PHQ 2/9 Scores  PHQ - 2 Score 0 0 0 0 0 0 0  PHQ- 9 Score 1          Fall Risk     09/12/2023    2:58 PM 11/28/2022    8:10 AM 08/31/2022    8:49 AM 07/18/2022    8:12 AM 03/02/2022    8:18 AM  Fall Risk   Falls in the past year? 0 0 0 0 0  Number falls in past yr: 0 0 0 0 0  Injury with Fall? 0 0 0 0 0  Risk for fall due to : No Fall Risks No Fall Risks No Fall Risks No Fall Risks No Fall Risks  Follow up Falls prevention discussed;Falls evaluation completed Falls evaluation completed Falls evaluation completed Falls evaluation completed Falls evaluation completed    MEDICARE RISK AT HOME:  Medicare Risk at Home Any stairs in or around the home?: Yes If so, are there any without handrails?: No Home free of loose throw rugs in walkways, pet beds, electrical cords, etc?: Yes Adequate lighting in  your home to reduce risk of falls?: Yes Life alert?: No Use of a cane, walker or w/c?: No Grab bars in the bathroom?: Yes Shower chair or bench in shower?: Yes Elevated toilet seat or a handicapped toilet?: Yes  TIMED UP AND GO:  Was the test performed?  No  Cognitive Function: 6CIT completed        09/12/2023    2:52 PM 09/07/2022    8:28 AM 09/01/2019    9:10 AM  6CIT Screen  What Year? 0 points 0 points 0 points  What month? 0 points 0 points 0 points  What time? 0 points 0 points 0 points  Count back from 20 0 points 0 points 0 points  Months in reverse 0 points 0 points 0 points  Repeat phrase 0 points 0 points 0 points  Total Score 0 points 0 points 0 points    Immunizations Immunization History  Administered Date(s) Administered   Fluad Quad(high Dose 65+) 03/02/2022   Influenza, High Dose Seasonal PF 01/17/2017   Influenza-Unspecified 03/11/2021   PFIZER(Purple Top)SARS-COV-2 Vaccination 07/19/2019, 08/09/2019, 03/05/2020   Pfizer Covid-19 Firefighter  Booster 55yrs & up 01/27/2021   Pneumococcal Conjugate-13 06/23/2013   Pneumococcal Polysaccharide-23 03/20/2012, 07/23/2018   Td 09/20/2007   Tdap 02/22/2018   Unspecified SARS-COV-2 Vaccination 02/27/2023    Screening Tests Health Maintenance  Topic Date Due   Zoster Vaccines- Shingrix (1 of 2) Never done   COVID-19 Vaccine (6 - Pfizer risk 2024-25 season) 08/28/2023   INFLUENZA VACCINE  12/14/2023   Medicare Annual Wellness (AWV)  09/11/2024   Colonoscopy  09/09/2025   DTaP/Tdap/Td (3 - Td or Tdap) 02/23/2028   Pneumonia Vaccine 30+ Years old  Completed   Hepatitis C Screening  Completed   HPV VACCINES  Aged Out   Meningococcal B Vaccine  Aged Out    Health Maintenance  Health Maintenance Due  Topic Date Due   Zoster Vaccines- Shingrix (1 of 2) Never done   COVID-19 Vaccine (6 - Pfizer risk 2024-25 season) 08/28/2023   Health Maintenance Items Addressed:declined vaccinations  Additional  Screening:  Vision Screening: Recommended annual ophthalmology exams for early detection of glaucoma and other disorders of the eye.  Dental Screening: Recommended annual dental exams for proper oral hygiene  Community Resource Referral / Chronic Care Management: CRR required this visit?  No   CCM required this visit?  No     Plan:     I have personally reviewed and noted the following in the patient's chart:   Medical and social history Use of alcohol, tobacco or illicit drugs  Current medications and supplements including opioid prescriptions. Patient is not currently taking opioid prescriptions. Functional ability and status Nutritional status Physical activity Advanced directives List of other physicians Hospitalizations, surgeries, and ER visits in previous 12 months Vitals Screenings to include cognitive, depression, and falls Referrals and appointments  In addition, I have reviewed and discussed with patient certain preventive protocols, quality metrics, and best practice recommendations. A written personalized care plan for preventive services as well as general preventive health recommendations were provided to patient.     Freeda Jerry, [place]   09/12/2023   After Visit Summary: (MyChart) Due to this being a telephonic visit, the after visit summary with patients personalized plan was offered to patient via MyChart   Notes: Nothing significant to report at this time.

## 2023-09-14 ENCOUNTER — Encounter: Payer: Self-pay | Admitting: Gastroenterology

## 2023-09-14 ENCOUNTER — Ambulatory Visit (INDEPENDENT_AMBULATORY_CARE_PROVIDER_SITE_OTHER): Admitting: Physical Therapy

## 2023-09-14 ENCOUNTER — Encounter: Payer: Self-pay | Admitting: Physical Therapy

## 2023-09-14 DIAGNOSIS — R262 Difficulty in walking, not elsewhere classified: Secondary | ICD-10-CM | POA: Diagnosis not present

## 2023-09-14 DIAGNOSIS — M25662 Stiffness of left knee, not elsewhere classified: Secondary | ICD-10-CM | POA: Diagnosis not present

## 2023-09-14 DIAGNOSIS — R600 Localized edema: Secondary | ICD-10-CM | POA: Diagnosis not present

## 2023-09-14 DIAGNOSIS — R2681 Unsteadiness on feet: Secondary | ICD-10-CM

## 2023-09-14 DIAGNOSIS — M25562 Pain in left knee: Secondary | ICD-10-CM | POA: Diagnosis not present

## 2023-09-14 NOTE — Therapy (Signed)
 OUTPATIENT PHYSICAL THERAPY LOWER EXTREMITY TREATMENT  Patient Name: Luis Knapp MRN: [id number] DOB:13-Jun-1950, 73 y.o., male Today's Date: 09/14/2023  END OF SESSION:  PT End of Session - 09/14/23 1105     Visit Number 6    Number of Visits 17    Date for PT Re-Evaluation 10/19/23    Authorization Type MCR    Authorization Time Period 08/24/23 to 10/19/23    Progress Note Due on Visit 10    PT Start Time 1100    PT Stop Time 1140    PT Time Calculation (min) 40 min    Activity Tolerance Patient tolerated treatment well    Behavior During Therapy [facility] for tasks assessed/performed               Past Medical History:  Diagnosis Date   Allergic rhinitis due to pollen    Allergy    Arthritis    Constipation 2018   Diverticulosis of colon (without mention of hemorrhage)    ED (erectile dysfunction)    Elevated PSA    GERD (gastroesophageal reflux disease)    History of colon polyps    2008--  PRE CANEROUS/   2013-- ADENOMA   Low back pain 2018   Muscle spasm 2018   Muscle weakness 2018   Post-void dribbling 2018   Prostate cancer (HCC) 11/01/2016   prostate cancer   [name]    Stress incontinence 2018   Unspecified essential hypertension    Urinary retention 2018   Wears glasses    Past Surgical History:  Procedure Laterality Date   COLONOSCOPY     COLONOSCOPY WITH PROPOFOL   10-30-2011  &  2008   POLYPECTOMY   HYDROCELE EXCISION Right 07/18/2013   Procedure: RIGHT HYDROCELECTOMY ADULT;  Surgeon: Andrez Banker, MD;  Location: [facility];  Service: Urology;  Laterality: Right;   LYMPH NODE DISSECTION Bilateral 11/01/2016   Procedure: BILATERAL PELVIC LYMPH NODE DISSECTION;  Surgeon: Andrez Banker, MD;  Location: [facility];  Service: Urology;  Laterality: Bilateral;   POLYPECTOMY     PROSTATE BIOPSY     ROBOT ASSISTED LAPAROSCOPIC RADICAL PROSTATECTOMY N/A 11/01/2016   Procedure: XI ROBOTIC ASSISTED LAPAROSCOPIC RADICAL  PROSTATECTOMY;  Surgeon: Andrez Banker, MD;  Location: [facility];  Service: Urology;  Laterality: N/A;   TOTAL KNEE ARTHROPLASTY Left 08/10/2023   Procedure: ARTHROPLASTY, KNEE, TOTAL;  Surgeon: Arnie Lao, MD;  Location: [facility];  Service: Orthopedics;  Laterality: Left;  LEFT TOTAL KNEE ARTHROPLASTY  SPINAL + BLOCK   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   Medicare annual wellness visit, initial 10/18/2015   Hydrocele, right 06/24/2013   Routine general medical examination at a health care facility 12/04/2010   Morbid obesity (HCC) 05/18/2010   SCIATICA 05/18/2010   POLYP, COLON 08/03/2007   Essential hypertension 08/03/2007   Diverticulosis of colon 08/03/2007    PCP: Adra Alanis FNP   REFERRING PROVIDER: Arnie Lao, MD  REFERRING DIAG:  Diagnosis  [phone] (ICD-10-CM) - Status post total left knee replacement  M17.12 (ICD-10-CM) - Unilateral primary osteoarthritis, left knee    THERAPY DIAG:  Acute pain of left knee  Stiffness of left knee, not elsewhere classified  Localized edema  Difficulty in  walking, not elsewhere classified  Unsteadiness on feet  Rationale for Evaluation and Treatment: Rehabilitation  ONSET DATE: surgery 08/10/23  SUBJECTIVE:   SUBJECTIVE STATEMENT:  Nothing really new, forgot pain pill this morning    PERTINENT HISTORY: Lt TKA 08/10/23, OA, prostate CA, LBP  PAIN:  Are you having pain? Yes: NPRS scale: 2/10 Pain location: surgical left knee  Pain description: post-op pains/random aches  Aggravating factors: everything especially if I push it too hard especially on bike  Relieving factors: Tylenol , ibuprofen  PRECAUTIONS: Fall  RED  FLAGS: None   WEIGHT BEARING RESTRICTIONS: No  FALLS:  Has patient fallen in last 6 months? No  LIVING ENVIRONMENT: Lives with: lives with their spouse Lives in: House/apartment Stairs: 2 steps from carport into house, also has concrete ramp about 85ft (pt reports it is too steep) Has following equipment at home: Single point cane, Walker - 2 wheeled, Environmental consultant - 4 wheeled, and Ramped entry  OCCUPATION: retired   PLOF: Independent, Independent with basic ADLs, Independent with gait, and Independent with transfers  PATIENT GOALS: get back to PLOF- walking for exercise, be able to do floor transfers, build stamina, navigate steps and ramp safely   NEXT MD VISIT: May 12th with Dr. Lucienne Ryder  OBJECTIVE:  Note: Objective measures were completed at Evaluation unless otherwise noted.  DIAGNOSTIC FINDINGS: Narrative & Impression CLINICAL DATA:  Status post left knee replacement.   EXAM: PORTABLE LEFT KNEE - 1-2 VIEW   COMPARISON:  None Available.   FINDINGS: Left knee arthroplasty in expected alignment. No periprosthetic lucency or fracture. Recent postsurgical change includes air and edema in the soft tissues and joint space. Anterior skin staples in place.   IMPRESSION: Left knee arthroplasty without immediate postoperative complication.     Electronically Signed   By: Chadwick Colonel M.D.   On: 08/10/2023 18:45  Narrative & Impression CLINICAL DATA:  Prostate carcinoma with biochemical recurrence.   EXAM: NUCLEAR MEDICINE PET SKULL BASE TO THIGH   TECHNIQUE: 7.5 mCi Flotufolastat (Posluma ) was injected intravenously. Full-ring PET imaging was performed from the skull base to thigh after the radiotracer. CT data was obtained and used for attenuation correction and anatomic localization.   COMPARISON:  None Available.   FINDINGS: NECK   No radiotracer activity in neck lymph nodes.   Incidental CT finding: None.   CHEST   No radiotracer accumulation within  mediastinal or hilar lymph nodes. No suspicious pulmonary nodules on the CT scan.   Incidental CT finding: None.   ABDOMEN/PELVIS   Prostate: No focal activity in the prostate bed.   Lymph nodes: There is a focus of radiotracer activity in the presacral space just RIGHT of midline with SUV max equal 4.9 (image 164). There is a subtle soft tissue density measuring approximately 4 mm at this site (image 165/series 4)   The second tiny focus radiotracer activity along the LEFT internal iliac artery in the deep pelvis on image 180 with SUV max equal 3.4. No clear CT correlation.   Liver: No evidence of liver metastasis.   Incidental CT finding: None.   SKELETON   No focal activity to suggest skeletal metastasis.   IMPRESSION: 1. Two tiny foci of radiotracer activity in the pelvis are concerning for prostate cancer metastasis. 2. No evidence of local prostate cancer recurrence in the prostate bed. 3. No evidence of visceral metastasis or skeletal metastasis.  PATIENT SURVEYS:    Patient-Specific Activity Scoring Scheme  "0" represents "unable to perform." "10" represents "able  to perform at prior level. 0 1 2 3 4 5 6 7 8 9  10 (Date and Score)   c LOWER EXTREMITY ROM:  Active ROM Left eval Left 08/30/23 Left 09/07/23 Left 09/12/2023  Hip flexion      Hip extension      Hip abduction      Hip adduction      Hip internal rotation      Hip external rotation      Knee flexion 101* supine heel slide  Supine heel slide 105* Supine heel slide 108* 105 supine  Knee extension 7* supine quad set with heel prop   6* supine quad set with heel prop  Lacking 3 degrees (-3*)  Ankle dorsiflexion      Ankle plantarflexion      Ankle inversion      Ankle eversion       (Blank rows = not tested)  LOWER EXTREMITY MMT:  MMT Left eval  Hip flexion 3+  Hip extension   Hip abduction   Hip adduction   Hip internal rotation   Hip external rotation   Knee flexion   Knee extension  4  Ankle dorsiflexion 5  Ankle plantarflexion   Ankle inversion   Ankle eversion    (Blank rows = not tested)  FUNCTIONAL TESTS:  08/24/2023 Evaluation:  5 times sit to stand: 47 seconds no UEs  Timed up and go (TUG): 33 seconds 4WW   GAIT: 08/24/2023 Evaluation:  Distance walked: in clinic distances  Assistive device utilized: Environmental consultant - 4 wheeled Level of assistance: Modified independence Comments: slow gait pattern, intermittent heel toe, very slow pace of pain, some toe out B   TODAY'S TREATMENT                                                                          DATE:    09/14/23  Scifit seat 6 x10 minutes full rotations   Knee flexion stretch 15x5 second holds  LAQ 5# 2x10  STS with  L LE staggered back 2x10 cues to not shift off of surgical LE  HS stretches 3x30 seconds L Piriformis stretch 2x30 seconds L  Supine quad/hip flexor stretch 2x30 seconds L   Percussion gun L quad and lateral thigh with LEs in elevation     09/12/2023 Upper heel cords box Supine hamstrings stretch with ankle dorsiflexion 4 x 20 seconds Prone quadriceps stretch 4 x 20 seconds Quadriceps sets 10 x 5 seconds Seated straight leg raises 2 sets of 5 for 3 seconds  Functional Activities: Double Leg Press 100# slow eccentrics 15 x  Single Leg Press 50# 10 X  Vaso left knee high 34* 10 minutes   09/07/23 Precor bike seat 6 needed a couple of minutes of partially rotations, then able to work up to full x10 minutes Shuttle LE press double leg: 62# x10 with focus on holds in end range extension and flexion, eccentric control  Bridges with progressive knee flexion 3x5  Seated HS stretches L LE 3x30 seconds strap  Knee extension stretch 0# on stool x2 minutes   Retrograde massage LEs elevated + ankle pumps Percussion gun to L quad with towel for padding  Encouraged regular ice/elevation at home  on a daily basis    08/30/2023 Therapeutic Exercise: Precor recumbent bike seat 7 rock for  flex stretch 2.5 min, backwards 1.5 min, then full revolutions level 1 for 5 min. Gastroc stretch step heel depression 30 sec hold 3 reps ea LE LLE Hamstring stretch seated with strap assist 30 second hold 3 reps Quad stretch with LLE over edge of bed strap assisted knee flexion 30 second hold 3 reps LLE supine heel slide with ball & strap in BUEs rolling ball for knee flexion 5 sec hold and roll out 5 sec leg press / quad set. 10 reps 2 sets.  Vaso knee with elevation 10 min medium compression 34*   PATIENT EDUCATION:  Education details: see above  Person educated: Patient and Spouse Education method: Explanation and Verbal cues Education comprehension: verbalized understanding, returned demonstration, and needs further education  HOME EXERCISE PROGRAM: Access Code: [email or web address] URL: [email or web address] Date: 09/12/2023 Prepared by: Terral Ferrari  Exercises - Ankle Alphabet in Elevation  - 2-3 x daily - 7 x weekly - 1 sets - 1-3 reps - 15 minutes hold - Seated Knee Flexion Extension AROM   - 1-3 x daily - 7 x weekly - 1-3 sets - 10 reps - 5 seconds hold - Supine Heel Slide with Strap  - 1-3 x daily - 7 x weekly - 1-3 sets - 10 reps - 5 seconds hold - Gastroc Stretch on Step  - 1-3 x daily - 7 x weekly - 1 sets - 3 reps - 30 seconds hold - Seated Hamstring Stretch with Strap  - 1-3 x daily - 7 x weekly - 1 sets - 3 reps - 30 seconds hold - Supine Quadriceps Stretch with Strap on Table  - 1-3 x daily - 7 x weekly - 1 sets - 3 reps - 30 seconds hold - Small Range Straight Leg Raise  - 3 x daily - 7 x weekly - 2-3 sets - 5 reps - 3 seconds hold - Supine Hamstring Stretch with Strap  - 1 x daily - 7 x weekly - 1 sets - 4-5 reps - 20 seconds hold    ASSESSMENT:  CLINICAL IMPRESSION:  Pt arrives today doing OK, still having a lot of random aches and pains but as expected especially regarding recent TKR. Continued working on ROM and strength as appropriate given his current  phase recovery from surgery. Doing well, will see the MD on May 12th.    OBJECTIVE IMPAIRMENTS: Abnormal gait, decreased activity tolerance, decreased balance, decreased cognition, decreased knowledge of use of DME, decreased mobility, difficulty walking, decreased ROM, decreased strength, increased edema, increased fascial restrictions, impaired flexibility, obesity, and pain.   ACTIVITY LIMITATIONS: standing, squatting, stairs, transfers, bed mobility, and locomotion level  PARTICIPATION LIMITATIONS: driving, shopping, community activity, occupation, and yard work  PERSONAL FACTORS: Age, Education, Fitness, Past/current experiences, Profession, and Time since onset of injury/illness/exacerbation are also affecting patient's functional outcome.   REHAB POTENTIAL: Good  CLINICAL DECISION MAKING: Evolving/moderate complexity  EVALUATION COMPLEXITY: Moderate   GOALS: Goals reviewed with patient? No  SHORT TERM GOALS: Target date: 09/21/2023    Will be compliant with appropriate progressive HEP Goal status: Met 09/12/2023  2. L knee AROM flexion to be at least 115* and extension AROM to be no more than 3*  Goal status: Met 09/12/2023  3. Will be independent with edema management strategies  Goal status: Ongoing   09/12/2023   4. Gait pattern to have normalized without LRAD  Goal status:  Ongoing   09/12/2023   LONG TERM GOALS: Target date: 10/19/2023    MMT to have improved by one grade all weak groups Goal status: Ongoing   08/30/2023   2. Will be able to perform floor to stand transfers without difficulty with no more than S  Goal status:Ongoing   08/30/2023   3. Will be able to ascend and descend stairs and ramp reciprocally with no increase in pain Goal status: Ongoing   09/12/2023   4. Will be able to ambulate community distances and perform all household tasks with no increase in pain  Goal status: Ongoing   09/12/2023   5. PSFS to have improved by at least 3 points to  show improved QOL and subjective improvement  Goal status: Ongoing   08/30/2023  6. Will be able to walk for exercise as desired without increase from resting pain levels Goal status: Ongoing   09/12/2023    PLAN:  PT FREQUENCY: 2x/week  PT DURATION: 8 weeks  PLANNED INTERVENTIONS: 97164- PT Re-evaluation, 97110-Therapeutic exercises, 97530- Therapeutic activity, 97112- Neuromuscular re-education, 97535- Self Care, 16109- Manual therapy, [id number]- Gait training, [phone]- Electrical stimulation (unattended), [phone]- Ionotophoresis 4mg /ml Dexamethasone , Stair training, Taping, Dry Needling, Joint mobilization, and DME instructions  PLAN FOR NEXT SESSION: quadriceps and functional strengthening along with balance and proprioceptive activities. Progress bike seat next visit   Terrel Ferries, PT, DPT 09/14/23 11:44 AM

## 2023-09-19 ENCOUNTER — Ambulatory Visit (INDEPENDENT_AMBULATORY_CARE_PROVIDER_SITE_OTHER): Admitting: Physical Therapy

## 2023-09-19 ENCOUNTER — Encounter: Payer: Self-pay | Admitting: Rehabilitative and Restorative Service Providers"

## 2023-09-19 DIAGNOSIS — M25562 Pain in left knee: Secondary | ICD-10-CM

## 2023-09-19 DIAGNOSIS — R262 Difficulty in walking, not elsewhere classified: Secondary | ICD-10-CM

## 2023-09-19 DIAGNOSIS — M25662 Stiffness of left knee, not elsewhere classified: Secondary | ICD-10-CM

## 2023-09-19 DIAGNOSIS — R600 Localized edema: Secondary | ICD-10-CM

## 2023-09-19 DIAGNOSIS — R2681 Unsteadiness on feet: Secondary | ICD-10-CM

## 2023-09-19 NOTE — Therapy (Signed)
 OUTPATIENT PHYSICAL THERAPY LOWER EXTREMITY TREATMENT  Patient Name: Luis Knapp MRN: [id number] DOB:March 09, 1951, 73 y.o., male Today's Date: 09/19/2023  END OF SESSION:  PT End of Session - 09/19/23 1048     Visit Number 7    Number of Visits 17    Date for PT Re-Evaluation 10/19/23    Authorization Type MCR    Authorization Time Period 08/24/23 to 10/19/23    Progress Note Due on Visit 10    PT Start Time 1045    PT Stop Time 1140    PT Time Calculation (min) 55 min    Activity Tolerance Patient tolerated treatment well    Behavior During Therapy [facility] for tasks assessed/performed               Past Medical History:  Diagnosis Date   Allergic rhinitis due to pollen    Allergy    Arthritis    Constipation 2018   Diverticulosis of colon (without mention of hemorrhage)    ED (erectile dysfunction)    Elevated PSA    GERD (gastroesophageal reflux disease)    History of colon polyps    2008--  PRE CANEROUS/   2013-- ADENOMA   Low back pain 2018   Muscle spasm 2018   Muscle weakness 2018   Post-void dribbling 2018   Prostate cancer (HCC) 11/01/2016   prostate cancer   [name]    Stress incontinence 2018   Unspecified essential hypertension    Urinary retention 2018   Wears glasses    Past Surgical History:  Procedure Laterality Date   COLONOSCOPY     COLONOSCOPY WITH PROPOFOL   10-30-2011  &  2008   POLYPECTOMY   HYDROCELE EXCISION Right 07/18/2013   Procedure: RIGHT HYDROCELECTOMY ADULT;  Surgeon: Andrez Banker, MD;  Location: [facility];  Service: Urology;  Laterality: Right;   LYMPH NODE DISSECTION Bilateral 11/01/2016   Procedure: BILATERAL PELVIC LYMPH NODE DISSECTION;  Surgeon: Andrez Banker, MD;  Location: [facility];  Service: Urology;  Laterality: Bilateral;   POLYPECTOMY     PROSTATE BIOPSY     ROBOT ASSISTED LAPAROSCOPIC RADICAL PROSTATECTOMY N/A 11/01/2016   Procedure: XI ROBOTIC ASSISTED LAPAROSCOPIC RADICAL  PROSTATECTOMY;  Surgeon: Andrez Banker, MD;  Location: [facility];  Service: Urology;  Laterality: N/A;   TOTAL KNEE ARTHROPLASTY Left 08/10/2023   Procedure: ARTHROPLASTY, KNEE, TOTAL;  Surgeon: Arnie Lao, MD;  Location: [facility];  Service: Orthopedics;  Laterality: Left;  LEFT TOTAL KNEE ARTHROPLASTY  SPINAL + BLOCK   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   Medicare annual wellness visit, initial 10/18/2015   Hydrocele, right 06/24/2013   Routine general medical examination at a health care facility 12/04/2010   Morbid obesity (HCC) 05/18/2010   SCIATICA 05/18/2010   POLYP, COLON 08/03/2007   Essential hypertension 08/03/2007   Diverticulosis of colon 08/03/2007    PCP: Adra Alanis FNP   REFERRING PROVIDER: Arnie Lao, MD  REFERRING DIAG:  Diagnosis  [phone] (ICD-10-CM) - Status post total left knee replacement  M17.12 (ICD-10-CM) - Unilateral primary osteoarthritis, left knee    THERAPY DIAG:  Acute pain of left knee  Stiffness of left knee, not elsewhere classified  Localized edema  Difficulty in  walking, not elsewhere classified  Unsteadiness on feet  Rationale for Evaluation and Treatment: Rehabilitation  ONSET DATE: surgery 08/10/23  SUBJECTIVE:   SUBJECTIVE STATEMENT: Pt stating he was able to sleep in his bed for the first time last night. He still woke up with pains, but stating he felt better this morning just being able to lye flat.    PERTINENT HISTORY: Lt TKA 08/10/23, OA, prostate CA, LBP  PAIN:  Are you having pain? Yes: NPRS scale: 3-4/10 Pain location: surgical left knee  Pain description: post-op pains/random aches  Aggravating factors: everything especially  if I push it too hard especially on bike  Relieving factors: Tylenol , ibuprofen  PRECAUTIONS: Fall  RED FLAGS: None   WEIGHT BEARING RESTRICTIONS: No  FALLS:  Has patient fallen in last 6 months? No  LIVING ENVIRONMENT: Lives with: lives with their spouse Lives in: House/apartment Stairs: 2 steps from carport into house, also has concrete ramp about 80ft (pt reports it is too steep) Has following equipment at home: Single point cane, Walker - 2 wheeled, Environmental consultant - 4 wheeled, and Ramped entry  OCCUPATION: retired   PLOF: Independent, Independent with basic ADLs, Independent with gait, and Independent with transfers  PATIENT GOALS: get back to PLOF- walking for exercise, be able to do floor transfers, build stamina, navigate steps and ramp safely   NEXT MD VISIT: May 12th with Dr. Lucienne Ryder  OBJECTIVE:  Note: Objective measures were completed at Evaluation unless otherwise noted.  DIAGNOSTIC FINDINGS: Narrative & Impression CLINICAL DATA:  Status post left knee replacement.   EXAM: PORTABLE LEFT KNEE - 1-2 VIEW   COMPARISON:  None Available.   FINDINGS: Left knee arthroplasty in expected alignment. No periprosthetic lucency or fracture. Recent postsurgical change includes air and edema in the soft tissues and joint space. Anterior skin staples in place.   IMPRESSION: Left knee arthroplasty without immediate postoperative complication.     Electronically Signed   By: Chadwick Colonel M.D.   On: 08/10/2023 18:45  Narrative & Impression CLINICAL DATA:  Prostate carcinoma with biochemical recurrence.   EXAM: NUCLEAR MEDICINE PET SKULL BASE TO THIGH   TECHNIQUE: 7.5 mCi Flotufolastat (Posluma ) was injected intravenously. Full-ring PET imaging was performed from the skull base to thigh after the radiotracer. CT data was obtained and used for attenuation correction and anatomic localization.   COMPARISON:  None Available.   FINDINGS: NECK   No radiotracer  activity in neck lymph nodes.   Incidental CT finding: None.   CHEST   No radiotracer accumulation within mediastinal or hilar lymph nodes. No suspicious pulmonary nodules on the CT scan.   Incidental CT finding: None.   ABDOMEN/PELVIS   Prostate: No focal activity in the prostate bed.   Lymph nodes: There is a focus of radiotracer activity in the presacral space just RIGHT of midline with SUV max equal 4.9 (image 164). There is a subtle soft tissue density measuring approximately 4 mm at this site (image 165/series 4)   The second tiny focus radiotracer activity along the LEFT internal iliac artery in the deep pelvis on image 180 with SUV max equal 3.4. No clear CT correlation.   Liver: No evidence of liver metastasis.   Incidental CT finding: None.   SKELETON   No focal activity to suggest skeletal metastasis.   IMPRESSION: 1. Two tiny foci of radiotracer activity in the pelvis are concerning for prostate cancer metastasis. 2. No evidence of local prostate cancer recurrence in the prostate bed. 3. No  evidence of visceral metastasis or skeletal metastasis.  PATIENT SURVEYS:    Patient-Specific Activity Scoring Scheme  "0" represents "unable to perform." "10" represents "able to perform at prior level. 0 1 2 3 4 5 6 7 8 9  10 (Date and Score)   c LOWER EXTREMITY ROM:  Active ROM Left eval Left 08/30/23 Left  09/07/23 Left 09/12/2023 Left 09/19/23  Hip flexion       Hip extension       Hip abduction       Hip adduction       Hip internal rotation       Hip external rotation       Knee flexion 101* supine heel slide  Supine heel slide 105* Supine heel slide 108* 105 supine Supine A:110  Knee extension 7* supine quad set with heel prop   6* supine quad set with heel prop  Lacking 3 degrees (-3*) A: -3 deg  Ankle dorsiflexion       Ankle plantarflexion       Ankle inversion       Ankle eversion        (Blank rows = not tested)  LOWER EXTREMITY MMT:  MMT  Left eval Rt 09/19/23 Left 09/19/23 Sitting HHD  Hip flexion 3+    Hip extension     Hip abduction     Hip adduction     Hip internal rotation     Hip external rotation     Knee flexion  45.2, 50.1 32.4, 36.0  Knee extension 4 58.9, 63.5 47.4, 48.2  Ankle dorsiflexion 5    Ankle plantarflexion     Ankle inversion     Ankle eversion      (Blank rows = not tested)  FUNCTIONAL TESTS:  08/24/2023 Evaluation:  5 times sit to stand: 47 seconds no UEs  Timed up and go (TUG): 33 seconds 4WW   GAIT: 08/24/2023 Evaluation:  Distance walked: in clinic distances  Assistive device utilized: Environmental consultant - 4 wheeled Level of assistance: Modified independence Comments: slow gait pattern, intermittent heel toe, very slow pace of pain, some toe out B   TODAY'S TREATMENT                                                                          DATE:  09/19/23 TherEx Recumbent bike: seat 6 forward revolutions x 10 minutes Calf raises: x 10 holding 5 sec in parallel bars c bil UE support Seated LAQ 5# x 15 holding 3 sec Supine: bridges ball between knees, walking feet inward every 3 reps x 15 (max left knee flexion 110 deg) Supine knee flexion using strap and heel on ball x 10 holding end range 3 seconds TherAct Double Leg Press: 100# slow eccentrics 2 x 15 Single Leg Press, left LE:  56# 2 x 10 Step ups on 6 inch step leading with left LE in parallel bars bil UE support x 15  Lateral step ups (up and over 6 inch step) x 10 c bil UE support in parallel bars Modalities Vasopneumatic: 34 deg, medium compression, x 10 minutes    09/14/23  Scifit seat 6 x10 minutes full rotations   Knee flexion stretch 15x5 second holds  LAQ 5# 2x10  STS with  L LE staggered back 2x10 cues to not shift off of surgical LE  HS stretches 3x30 seconds L Piriformis stretch 2x30 seconds L  Supine quad/hip flexor stretch 2x30 seconds L   Percussion gun L quad and lateral thigh with LEs in elevation      09/12/2023 Upper heel cords box Supine hamstrings stretch with ankle dorsiflexion 4 x 20 seconds Prone quadriceps stretch 4 x 20 seconds Quadriceps sets 10 x 5 seconds Seated straight leg raises 2 sets of 5 for 3 seconds  Functional Activities: Double Leg Press 100# slow eccentrics 15 x  Single Leg Press 50# 10 X  Vaso left knee high 34* 10 minutes   09/07/23 Precor bike seat 6 needed a couple of minutes of partially rotations, then able to work up to full x10 minutes Shuttle LE press double leg: 62# x10 with focus on holds in end range extension and flexion, eccentric control  Bridges with progressive knee flexion 3x5  Seated HS stretches L LE 3x30 seconds strap  Knee extension stretch 0# on stool x2 minutes   Retrograde massage LEs elevated + ankle pumps Percussion gun to L quad with towel for padding  Encouraged regular ice/elevation at home on a daily basis       PATIENT EDUCATION:  Education details: see above  Person educated: Patient and Spouse Education method: Explanation and Verbal cues Education comprehension: verbalized understanding, returned demonstration, and needs further education  HOME EXERCISE PROGRAM: Access Code: [email or web address] URL: [email or web address] Date: 09/12/2023 Prepared by: Terral Ferrari  Exercises - Ankle Alphabet in Elevation  - 2-3 x daily - 7 x weekly - 1 sets - 1-3 reps - 15 minutes hold - Seated Knee Flexion Extension AROM   - 1-3 x daily - 7 x weekly - 1-3 sets - 10 reps - 5 seconds hold - Supine Heel Slide with Strap  - 1-3 x daily - 7 x weekly - 1-3 sets - 10 reps - 5 seconds hold - Gastroc Stretch on Step  - 1-3 x daily - 7 x weekly - 1 sets - 3 reps - 30 seconds hold - Seated Hamstring Stretch with Strap  - 1-3 x daily - 7 x weekly - 1 sets - 3 reps - 30 seconds hold - Supine Quadriceps Stretch with Strap on Table  - 1-3 x daily - 7 x weekly - 1 sets - 3 reps - 30 seconds hold - Small Range Straight Leg Raise  -  3 x daily - 7 x weekly - 2-3 sets - 5 reps - 3 seconds hold - Supine Hamstring Stretch with Strap  - 1 x daily - 7 x weekly - 1 sets - 4-5 reps - 20 seconds hold    ASSESSMENT:  CLINICAL IMPRESSION: Pt tolerating increased strengthening exercises this visit. Pt with active arch of motion left knee 3 to 110 degrees. Pt reporting muscle cramping at home. I recommended increasing his water  intake to stay hydrated as well as increasing his protein intake to facilitate muscle healing. Recommending continued skilled PT to maximize pt's function.   OBJECTIVE IMPAIRMENTS: Abnormal gait, decreased activity tolerance, decreased balance, decreased cognition, decreased knowledge of use of DME, decreased mobility, difficulty walking, decreased ROM, decreased strength, increased edema, increased fascial restrictions, impaired flexibility, obesity, and pain.   ACTIVITY LIMITATIONS: standing, squatting, stairs, transfers, bed mobility, and locomotion level  PARTICIPATION LIMITATIONS: driving, shopping, community activity, occupation, and yard work  PERSONAL FACTORS: Age, Education, Fitness, Past/current  experiences, Profession, and Time since onset of injury/illness/exacerbation are also affecting patient's functional outcome.   REHAB POTENTIAL: Good  CLINICAL DECISION MAKING: Evolving/moderate complexity  EVALUATION COMPLEXITY: Moderate   GOALS: Goals reviewed with patient? No  SHORT TERM GOALS: Target date: 09/21/2023    Will be compliant with appropriate progressive HEP Goal status: Met 09/12/2023  2. L knee AROM flexion to be at least 115* and extension AROM to be no more than 3*  Goal status: Met 09/12/2023  3. Will be independent with edema management strategies  Goal status: MET 09/19/23   4. Gait pattern to have normalized without LRAD  Goal status: MET 09/19/23  LONG TERM GOALS: Target date: 10/19/2023    MMT to have improved by one grade all weak groups Goal status: Ongoing    08/30/2023   2. Will be able to perform floor to stand transfers without difficulty with no more than S  Goal status:Ongoing   08/30/2023   3. Will be able to ascend and descend stairs and ramp reciprocally with no increase in pain Goal status: Ongoing   09/12/2023   4. Will be able to ambulate community distances and perform all household tasks with no increase in pain  Goal status: Ongoing   09/12/2023   5. PSFS to have improved by at least 3 points to show improved QOL and subjective improvement  Goal status: Ongoing   08/30/2023  6. Will be able to walk for exercise as desired without increase from resting pain levels Goal status: Ongoing   09/12/2023    PLAN:  PT FREQUENCY: 2x/week  PT DURATION: 8 weeks  PLANNED INTERVENTIONS: 97164- PT Re-evaluation, 97110-Therapeutic exercises, 97530- Therapeutic activity, 97112- Neuromuscular re-education, 97535- Self Care, 21308- Manual therapy, [phone]- Gait training, [phone]- Electrical stimulation (unattended), [phone]- Ionotophoresis 4mg /ml Dexamethasone , Stair training, Taping, Dry Needling, Joint mobilization, and DME instructions  PLAN FOR NEXT SESSION: quadriceps and functional strengthening along with balance and proprioceptive activities  Jerrel Mor, PT, MPT 09/19/23 11:52 AM   09/19/23 11:52 AM

## 2023-09-20 NOTE — Progress Notes (Signed)
Right knee x-ray shows some arthritis.

## 2023-09-21 ENCOUNTER — Encounter: Payer: Self-pay | Admitting: Physical Therapy

## 2023-09-21 ENCOUNTER — Ambulatory Visit (INDEPENDENT_AMBULATORY_CARE_PROVIDER_SITE_OTHER): Admitting: Physical Therapy

## 2023-09-21 DIAGNOSIS — R2681 Unsteadiness on feet: Secondary | ICD-10-CM

## 2023-09-21 DIAGNOSIS — M25662 Stiffness of left knee, not elsewhere classified: Secondary | ICD-10-CM | POA: Diagnosis not present

## 2023-09-21 DIAGNOSIS — M25562 Pain in left knee: Secondary | ICD-10-CM | POA: Diagnosis not present

## 2023-09-21 DIAGNOSIS — R600 Localized edema: Secondary | ICD-10-CM | POA: Diagnosis not present

## 2023-09-21 DIAGNOSIS — R262 Difficulty in walking, not elsewhere classified: Secondary | ICD-10-CM | POA: Diagnosis not present

## 2023-09-21 NOTE — Therapy (Signed)
 OUTPATIENT PHYSICAL THERAPY LOWER EXTREMITY TREATMENT  Patient Name: Polo Torchia MRN: [id number] DOB:Aug 25, 1950, 73 y.o., male Today's Date: 09/21/2023  END OF SESSION:  PT End of Session - 09/21/23 0938     Visit Number 8    Number of Visits 17    Date for PT Re-Evaluation 10/19/23    Authorization Type MCR    Authorization Time Period 08/24/23 to 10/19/23    Progress Note Due on Visit 10    PT Start Time 0934    PT Stop Time 1012    PT Time Calculation (min) 38 min    Activity Tolerance Patient tolerated treatment well    Behavior During Therapy [facility] for tasks assessed/performed                Past Medical History:  Diagnosis Date   Allergic rhinitis due to pollen    Allergy    Arthritis    Constipation 2018   Diverticulosis of colon (without mention of hemorrhage)    ED (erectile dysfunction)    Elevated PSA    GERD (gastroesophageal reflux disease)    History of colon polyps    2008--  PRE CANEROUS/   2013-- ADENOMA   Low back pain 2018   Muscle spasm 2018   Muscle weakness 2018   Post-void dribbling 2018   Prostate cancer (HCC) 11/01/2016   prostate cancer   [name]    Stress incontinence 2018   Unspecified essential hypertension    Urinary retention 2018   Wears glasses    Past Surgical History:  Procedure Laterality Date   COLONOSCOPY     COLONOSCOPY WITH PROPOFOL   10-30-2011  &  2008   POLYPECTOMY   HYDROCELE EXCISION Right 07/18/2013   Procedure: RIGHT HYDROCELECTOMY ADULT;  Surgeon: Andrez Banker, MD;  Location: [facility];  Service: Urology;  Laterality: Right;   LYMPH NODE DISSECTION Bilateral 11/01/2016   Procedure: BILATERAL PELVIC LYMPH NODE DISSECTION;  Surgeon: Andrez Banker, MD;  Location: [facility];  Service: Urology;  Laterality: Bilateral;   POLYPECTOMY     PROSTATE BIOPSY     ROBOT ASSISTED LAPAROSCOPIC RADICAL PROSTATECTOMY N/A 11/01/2016   Procedure: XI ROBOTIC ASSISTED LAPAROSCOPIC RADICAL  PROSTATECTOMY;  Surgeon: Andrez Banker, MD;  Location: [facility];  Service: Urology;  Laterality: N/A;   TOTAL KNEE ARTHROPLASTY Left 08/10/2023   Procedure: ARTHROPLASTY, KNEE, TOTAL;  Surgeon: Arnie Lao, MD;  Location: [facility];  Service: Orthopedics;  Laterality: Left;  LEFT TOTAL KNEE ARTHROPLASTY  SPINAL + BLOCK   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   Medicare annual wellness visit, initial 10/18/2015   Hydrocele, right 06/24/2013   Routine general medical examination at a health care facility 12/04/2010   Morbid obesity (HCC) 05/18/2010   SCIATICA 05/18/2010   POLYP, COLON 08/03/2007   Essential hypertension 08/03/2007   Diverticulosis of colon 08/03/2007    PCP: Adra Alanis FNP   REFERRING PROVIDER: Arnie Lao, MD  REFERRING DIAG:  Diagnosis  [phone] (ICD-10-CM) - Status post total left knee replacement  M17.12 (ICD-10-CM) - Unilateral primary osteoarthritis, left knee    THERAPY DIAG:  Acute pain of left knee  Stiffness of left knee, not elsewhere classified  Localized edema  Difficulty  in walking, not elsewhere classified  Unsteadiness on feet  Rationale for Evaluation and Treatment: Rehabilitation  ONSET DATE: surgery 08/10/23  SUBJECTIVE:   SUBJECTIVE STATEMENT:  Doing well, pain is good and I haven't had any swelling in my knee this week    PERTINENT HISTORY: Lt TKA 08/10/23, OA, prostate CA, LBP  PAIN:  Are you having pain? No 0/10 now, over past week 1/10 at worst   PRECAUTIONS: Fall  RED FLAGS: None   WEIGHT BEARING RESTRICTIONS: No  FALLS:  Has patient fallen in last 6 months? No  LIVING ENVIRONMENT: Lives with: lives with their spouse Lives in:  House/apartment Stairs: 2 steps from carport into house, also has concrete ramp about 4ft (pt reports it is too steep) Has following equipment at home: Single point cane, Walker - 2 wheeled, Environmental consultant - 4 wheeled, and Ramped entry  OCCUPATION: retired   PLOF: Independent, Independent with basic ADLs, Independent with gait, and Independent with transfers  PATIENT GOALS: get back to PLOF- walking for exercise, be able to do floor transfers, build stamina, navigate steps and ramp safely   NEXT MD VISIT: May 12th with Dr. Lucienne Ryder  OBJECTIVE:  Note: Objective measures were completed at Evaluation unless otherwise noted.  DIAGNOSTIC FINDINGS: Narrative & Impression CLINICAL DATA:  Status post left knee replacement.   EXAM: PORTABLE LEFT KNEE - 1-2 VIEW   COMPARISON:  None Available.   FINDINGS: Left knee arthroplasty in expected alignment. No periprosthetic lucency or fracture. Recent postsurgical change includes air and edema in the soft tissues and joint space. Anterior skin staples in place.   IMPRESSION: Left knee arthroplasty without immediate postoperative complication.     Electronically Signed   By: Chadwick Colonel M.D.   On: 08/10/2023 18:45  Narrative & Impression CLINICAL DATA:  Prostate carcinoma with biochemical recurrence.   EXAM: NUCLEAR MEDICINE PET SKULL BASE TO THIGH   TECHNIQUE: 7.5 mCi Flotufolastat (Posluma ) was injected intravenously. Full-ring PET imaging was performed from the skull base to thigh after the radiotracer. CT data was obtained and used for attenuation correction and anatomic localization.   COMPARISON:  None Available.   FINDINGS: NECK   No radiotracer activity in neck lymph nodes.   Incidental CT finding: None.   CHEST   No radiotracer accumulation within mediastinal or hilar lymph nodes. No suspicious pulmonary nodules on the CT scan.   Incidental CT finding: None.   ABDOMEN/PELVIS   Prostate: No focal activity in the  prostate bed.   Lymph nodes: There is a focus of radiotracer activity in the presacral space just RIGHT of midline with SUV max equal 4.9 (image 164). There is a subtle soft tissue density measuring approximately 4 mm at this site (image 165/series 4)   The second tiny focus radiotracer activity along the LEFT internal iliac artery in the deep pelvis on image 180 with SUV max equal 3.4. No clear CT correlation.   Liver: No evidence of liver metastasis.   Incidental CT finding: None.   SKELETON   No focal activity to suggest skeletal metastasis.   IMPRESSION: 1. Two tiny foci of radiotracer activity in the pelvis are concerning for prostate cancer metastasis. 2. No evidence of local prostate cancer recurrence in the prostate bed. 3. No evidence of visceral metastasis or skeletal metastasis.  PATIENT SURVEYS:    Patient-Specific Activity Scoring Scheme  "0" represents "unable to perform." "10" represents "able to perform at prior level. 0 1 2 3 4 5 6 7 8  9  10 (Date and Score)   c LOWER EXTREMITY ROM:  Active ROM Left eval Left 08/30/23 Left  09/07/23 Left 09/12/2023 Left 09/19/23  Hip flexion       Hip extension       Hip abduction       Hip adduction       Hip internal rotation       Hip external rotation       Knee flexion 101* supine heel slide  Supine heel slide 105* Supine heel slide 108* 105 supine Supine A:110  Knee extension 7* supine quad set with heel prop   6* supine quad set with heel prop  Lacking 3 degrees (-3*) A: -3 deg  Ankle dorsiflexion       Ankle plantarflexion       Ankle inversion       Ankle eversion        (Blank rows = not tested)  LOWER EXTREMITY MMT:  MMT Left eval Rt 09/19/23 Left 09/19/23 Sitting HHD  Hip flexion 3+    Hip extension     Hip abduction     Hip adduction     Hip internal rotation     Hip external rotation     Knee flexion  45.2, 50.1 32.4, 36.0  Knee extension 4 58.9, 63.5 47.4, 48.2  Ankle dorsiflexion 5     Ankle plantarflexion     Ankle inversion     Ankle eversion      (Blank rows = not tested)  FUNCTIONAL TESTS:  08/24/2023 Evaluation:  5 times sit to stand: 47 seconds no UEs  Timed up and go (TUG): 33 seconds 4WW   GAIT: 08/24/2023 Evaluation:  Distance walked: in clinic distances  Assistive device utilized: Environmental consultant - 4 wheeled Level of assistance: Modified independence Comments: slow gait pattern, intermittent heel toe, very slow pace of pain, some toe out B   TODAY'S TREATMENT                                                                          DATE:    09/21/23  Scifit bike x10 min full rotations seat 6 (Precore not available) for ROM, w/u and tissue perfusion  Shuttle LE press BLEs 100# x15  focus on eccentric lower  Shuttle LE press single leg 56# x15 B   LAQ ladder: 5#x15, 7# x10, 9# x5 HS stretches 2x30 seconds B standing     Tandem stance blue foam pad 2x30 seconds B  Tandem walks in // bars 4 laps solid surface          09/19/23 TherEx Recumbent bike: seat 6 forward revolutions x 10 minutes Calf raises: x 10 holding 5 sec in parallel bars c bil UE support Seated LAQ 5# x 15 holding 3 sec Supine: bridges ball between knees, walking feet inward every 3 reps x 15 (max left knee flexion 110 deg) Supine knee flexion using strap and heel on ball x 10 holding end range 3 seconds TherAct Double Leg Press: 100# slow eccentrics 2 x 15 Single Leg Press, left LE:  56# 2 x 10 Step ups on 6 inch step leading with left LE in parallel bars bil UE support x 15  Lateral  step ups (up and over 6 inch step) x 10 c bil UE support in parallel bars Modalities Vasopneumatic: 34 deg, medium compression, x 10 minutes      PATIENT EDUCATION:  Education details: see above  Person educated: Patient and Spouse Education method: Explanation and Verbal cues Education comprehension: verbalized understanding, returned demonstration, and needs further education  HOME EXERCISE  PROGRAM: Access Code: [email or web address] URL: [email or web address] Date: 09/12/2023 Prepared by: Terral Ferrari  Exercises - Ankle Alphabet in Elevation  - 2-3 x daily - 7 x weekly - 1 sets - 1-3 reps - 15 minutes hold - Seated Knee Flexion Extension AROM   - 1-3 x daily - 7 x weekly - 1-3 sets - 10 reps - 5 seconds hold - Supine Heel Slide with Strap  - 1-3 x daily - 7 x weekly - 1-3 sets - 10 reps - 5 seconds hold - Gastroc Stretch on Step  - 1-3 x daily - 7 x weekly - 1 sets - 3 reps - 30 seconds hold - Seated Hamstring Stretch with Strap  - 1-3 x daily - 7 x weekly - 1 sets - 3 reps - 30 seconds hold - Supine Quadriceps Stretch with Strap on Table  - 1-3 x daily - 7 x weekly - 1 sets - 3 reps - 30 seconds hold - Small Range Straight Leg Raise  - 3 x daily - 7 x weekly - 2-3 sets - 5 reps - 3 seconds hold - Supine Hamstring Stretch with Strap  - 1 x daily - 7 x weekly - 1 sets - 4-5 reps - 20 seconds hold    ASSESSMENT:  CLINICAL IMPRESSION:  Doing well, strength and ROM objective measures were taken last visit so we deferred this today but did route note to MD prior to Monday's appt. He is doing well and ROM is looking good- we focused more on strength and balance work today than anything. Anticipate MD will be pleased with his progress. Does continue to have significant quad weakness. We will continue to challenge him as appropriate.    OBJECTIVE IMPAIRMENTS: Abnormal gait, decreased activity tolerance, decreased balance, decreased cognition, decreased knowledge of use of DME, decreased mobility, difficulty walking, decreased ROM, decreased strength, increased edema, increased fascial restrictions, impaired flexibility, obesity, and pain.   ACTIVITY LIMITATIONS: standing, squatting, stairs, transfers, bed mobility, and locomotion level  PARTICIPATION LIMITATIONS: driving, shopping, community activity, occupation, and yard work  PERSONAL FACTORS: Age, Education, Fitness,  Past/current experiences, Profession, and Time since onset of injury/illness/exacerbation are also affecting patient's functional outcome.   REHAB POTENTIAL: Good  CLINICAL DECISION MAKING: Evolving/moderate complexity  EVALUATION COMPLEXITY: Moderate   GOALS: Goals reviewed with patient? No  SHORT TERM GOALS: Target date: 09/21/2023    Will be compliant with appropriate progressive HEP Goal status: Met 09/12/2023  2. L knee AROM flexion to be at least 115* and extension AROM to be no more than 3*  Goal status: Met 09/12/2023  3. Will be independent with edema management strategies  Goal status: MET 09/19/23   4. Gait pattern to have normalized without LRAD  Goal status: MET 09/19/23  LONG TERM GOALS: Target date: 10/19/2023    MMT to have improved by one grade all weak groups Goal status: Ongoing   08/30/2023   2. Will be able to perform floor to stand transfers without difficulty with no more than S  Goal status:Ongoing   08/30/2023   3. Will be able to ascend and descend stairs  and ramp reciprocally with no increase in pain Goal status: Ongoing   09/12/2023   4. Will be able to ambulate community distances and perform all household tasks with no increase in pain  Goal status: Ongoing   09/12/2023   5. PSFS to have improved by at least 3 points to show improved QOL and subjective improvement  Goal status: Ongoing   08/30/2023  6. Will be able to walk for exercise as desired without increase from resting pain levels Goal status: Ongoing   09/12/2023    PLAN:  PT FREQUENCY: 2x/week  PT DURATION: 8 weeks  PLANNED INTERVENTIONS: 97164- PT Re-evaluation, 97110-Therapeutic exercises, 97530- Therapeutic activity, 97112- Neuromuscular re-education, 97535- Self Care, 11914- Manual therapy, [phone]- Gait training, [phone]- Electrical stimulation (unattended), [phone]- Ionotophoresis 4mg /ml Dexamethasone , Stair training, Taping, Dry Needling, Joint mobilization, and DME  instructions  PLAN FOR NEXT SESSION: quadriceps and functional strengthening along with balance and proprioceptive activities, progressions as appropriate   Terrel Ferries, PT, DPT 09/21/23 10:13 AM

## 2023-09-24 ENCOUNTER — Ambulatory Visit (INDEPENDENT_AMBULATORY_CARE_PROVIDER_SITE_OTHER): Admitting: Orthopaedic Surgery

## 2023-09-24 ENCOUNTER — Encounter: Payer: Self-pay | Admitting: Orthopaedic Surgery

## 2023-09-24 DIAGNOSIS — Z96652 Presence of left artificial knee joint: Secondary | ICD-10-CM

## 2023-09-24 NOTE — Progress Notes (Signed)
 The patient is now 6 weeks status post a left total knee replacement.  He is doing well overall.  He is an active 73 year old gentleman.  On exam there is still swelling and expected warmth of the left knee but it is ligamentously stable.  He has good flexion and good extension of that knee.  I am very pleased and how he is push himself through therapy.  From our standpoint we do not need to see him back for 6 months unless he is having issues.  Will have a standing AP and lateral of his left operative knee at that visit.

## 2023-09-25 ENCOUNTER — Ambulatory Visit (INDEPENDENT_AMBULATORY_CARE_PROVIDER_SITE_OTHER): Admitting: Rehabilitative and Restorative Service Providers"

## 2023-09-25 ENCOUNTER — Encounter: Payer: Self-pay | Admitting: Rehabilitative and Restorative Service Providers"

## 2023-09-25 DIAGNOSIS — M25662 Stiffness of left knee, not elsewhere classified: Secondary | ICD-10-CM | POA: Diagnosis not present

## 2023-09-25 DIAGNOSIS — R600 Localized edema: Secondary | ICD-10-CM

## 2023-09-25 DIAGNOSIS — R262 Difficulty in walking, not elsewhere classified: Secondary | ICD-10-CM | POA: Diagnosis not present

## 2023-09-25 DIAGNOSIS — M25562 Pain in left knee: Secondary | ICD-10-CM

## 2023-09-25 DIAGNOSIS — R2681 Unsteadiness on feet: Secondary | ICD-10-CM

## 2023-09-25 NOTE — Therapy (Signed)
 OUTPATIENT PHYSICAL THERAPY  TREATMENT  Patient Name: Luis Knapp MRN: [id number] DOB:1950/12/28, 73 y.o., male Today's Date: 09/25/2023  END OF SESSION:  PT End of Session - 09/25/23 0921     Visit Number 9    Number of Visits 17    Date for PT Re-Evaluation 10/19/23    Authorization Type MCR    Authorization Time Period 08/24/23 to 10/19/23    Progress Note Due on Visit 10    PT Start Time 0923    PT Stop Time 1002    PT Time Calculation (min) 39 min    Activity Tolerance Patient tolerated treatment well    Behavior During Therapy [facility] for tasks assessed/performed                 Past Medical History:  Diagnosis Date   Allergic rhinitis due to pollen    Allergy    Arthritis    Constipation 2018   Diverticulosis of colon (without mention of hemorrhage)    ED (erectile dysfunction)    Elevated PSA    GERD (gastroesophageal reflux disease)    History of colon polyps    2008--  PRE CANEROUS/   2013-- ADENOMA   Low back pain 2018   Muscle spasm 2018   Muscle weakness 2018   Post-void dribbling 2018   Prostate cancer (HCC) 11/01/2016   prostate cancer   [name]    Stress incontinence 2018   Unspecified essential hypertension    Urinary retention 2018   Wears glasses    Past Surgical History:  Procedure Laterality Date   COLONOSCOPY     COLONOSCOPY WITH PROPOFOL   10-30-2011  &  2008   POLYPECTOMY   HYDROCELE EXCISION Right 07/18/2013   Procedure: RIGHT HYDROCELECTOMY ADULT;  Surgeon: Andrez Banker, MD;  Location: [facility];  Service: Urology;  Laterality: Right;   LYMPH NODE DISSECTION Bilateral 11/01/2016   Procedure: BILATERAL PELVIC LYMPH NODE DISSECTION;  Surgeon: Andrez Banker, MD;  Location: [facility];  Service: Urology;  Laterality: Bilateral;   POLYPECTOMY     PROSTATE BIOPSY     ROBOT ASSISTED LAPAROSCOPIC RADICAL PROSTATECTOMY N/A 11/01/2016   Procedure: XI ROBOTIC ASSISTED LAPAROSCOPIC RADICAL PROSTATECTOMY;   Surgeon: Andrez Banker, MD;  Location: [facility];  Service: Urology;  Laterality: N/A;   TOTAL KNEE ARTHROPLASTY Left 08/10/2023   Procedure: ARTHROPLASTY, KNEE, TOTAL;  Surgeon: Arnie Lao, MD;  Location: [facility];  Service: Orthopedics;  Laterality: Left;  LEFT TOTAL KNEE ARTHROPLASTY  SPINAL + BLOCK   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   Medicare annual wellness visit, initial 10/18/2015   Hydrocele, right 06/24/2013   Routine general medical examination at a health care facility 12/04/2010   Morbid obesity (HCC) 05/18/2010   SCIATICA 05/18/2010   POLYP, COLON 08/03/2007   Essential hypertension 08/03/2007   Diverticulosis of colon 08/03/2007    PCP: Adra Alanis FNP   REFERRING PROVIDER: Arnie Lao, MD  REFERRING DIAG:  Diagnosis  [phone] (ICD-10-CM) - Status post total left knee replacement  M17.12 (ICD-10-CM) - Unilateral primary osteoarthritis, left knee    THERAPY DIAG:  Acute pain of left knee  Stiffness of left knee, not elsewhere classified  Localized edema  Difficulty  in walking, not elsewhere classified  Unsteadiness on feet  Rationale for Evaluation and Treatment: Rehabilitation  ONSET DATE: surgery 08/10/23  SUBJECTIVE:   SUBJECTIVE STATEMENT: Pt indicated tightness chief complaint today but no real pian.  Rated about 60% overall improvement back to normal at this time.    PERTINENT HISTORY: Lt TKA 08/10/23, OA, prostate CA, LBP  PAIN:  Are you having pain? 0/10 upon arrival  PRECAUTIONS: Fall  RED FLAGS: None   WEIGHT BEARING RESTRICTIONS: No  FALLS:  Has patient fallen in last 6 months? No  LIVING ENVIRONMENT: Lives with: lives with their  spouse Lives in: House/apartment Stairs: 2 steps from carport into house, also has concrete ramp about 59ft (pt reports it is too steep) Has following equipment at home: Single point cane, Walker - 2 wheeled, Environmental consultant - 4 wheeled, and Ramped entry  OCCUPATION: retired   PLOF: Independent, Independent with basic ADLs, Independent with gait, and Independent with transfers  PATIENT GOALS: get back to PLOF- walking for exercise, be able to do floor transfers, build stamina, navigate steps and ramp safely   NEXT MD VISIT: May 12th with Dr. Lucienne Ryder  OBJECTIVE:  Note: Objective measures were completed at Evaluation unless otherwise noted.  DIAGNOSTIC FINDINGS:    IMPRESSION: 1. Two tiny foci of radiotracer activity in the pelvis are concerning for prostate cancer metastasis. 2. No evidence of local prostate cancer recurrence in the prostate bed. 3. No evidence of visceral metastasis or skeletal metastasis.  PATIENT SURVEYS:    Patient-Specific Activity Scoring Scheme  "0" represents "unable to perform." "10" represents "able to perform at prior level. 0 1 2 3 4 5 6 7 8 9  10 (Date and Score)     Activity Eval  09/25/2023   1. Walking 1.5 miles for exercise   0  7  2. Getting up and down from the floor  0   7  3. Stamina  3  5  4.Steps and ramp navigation 3 7   5.      Score 1.5  6.5 avg    Total score = sum of the activity scores/number of activities Minimum detectable change (90%CI) for average score = 2 points Minimum detectable change (90%CI) for single activity score = 3 points   LOWER EXTREMITY ROM:  Active ROM Left eval Left 08/30/23 Left  09/07/23 Left 09/12/2023 Left 09/19/23  Hip flexion       Hip extension       Hip abduction       Hip adduction       Hip internal rotation       Hip external rotation       Knee flexion 101* supine heel slide  Supine heel slide 105* Supine heel slide 108* 105 supine Supine A:110  Knee extension 7* supine quad set with heel prop    6* supine quad set with heel prop  Lacking 3 degrees (-3*) A: -3 deg  Ankle dorsiflexion       Ankle plantarflexion       Ankle inversion       Ankle eversion        (Blank rows = not tested)  LOWER EXTREMITY MMT:  MMT Left eval Rt 09/19/23 Left 09/19/23 Sitting HHD  Hip flexion 3+    Hip extension     Hip abduction     Hip adduction     Hip internal rotation     Hip external rotation     Knee flexion  45.2, 50.1 32.4, 36.0  Knee extension 4 58.9, 63.5 47.4, 48.2  Ankle dorsiflexion 5    Ankle plantarflexion     Ankle inversion     Ankle eversion      (Blank rows = not tested)  FUNCTIONAL TESTS:  09/25/2023: TUG Independent:  13.42 seconds 5x sit to stand: 20.59 seconds, no UE  08/24/2023 Evaluation:  5 times sit to stand: 47 seconds no UEs  Timed up and go (TUG): 33 seconds 4WW   GAIT: 08/24/2023 Evaluation:  Distance walked: in clinic distances  Assistive device utilized: Environmental consultant - 4 wheeled Level of assistance: Modified independence Comments: slow gait pattern, intermittent heel toe, very slow pace of pain, some toe out B                    TODAY'S TREATMENT                                                                          DATE: 09/25/2023 Therex: Recumbent bike lvl 6 8 mins ROM seat 5  Inciline gastroc stretch 30 sec x 3  Seated quad set with SLR x 15 bilateral with slow movement focus.   TherActivity (to improve stairs, transfers, squatting) Step on over and down 6 inch step no UE assist WB on Lt leg x 15  Leg press Lt only 2 x 15 56 lbs  5 x sit to stand from 18 inch chair s UE assist.   Neuro Re-ed Lt SLS blaze pod 3 lights anterior semi circle with tapping with Rt leg 20 sec x 4  Tandem stance on foam in // bars 1 min x 1 bilateral with occasional HHA on bar for corrections.  Tandem ambulation on foam in // bars 6 ft x 5 each way with occasional HHA on bar   Vasopneumatic device  Deferred today    TODAY'S TREATMENT                                                                           DATE: 09/21/23  Scifit bike x10 min full rotations seat 6 (Precore not available) for ROM, w/u and tissue perfusion  Shuttle LE press BLEs 100# x15  focus on eccentric lower  Shuttle LE press single leg 56# x15 B   LAQ ladder: 5#x15, 7# x10, 9# x5 HS stretches 2x30 seconds B standing     Tandem stance blue foam pad 2x30 seconds B  Tandem walks in // bars 4 laps solid surface   TODAY'S TREATMENT  DATE:09/19/23 TherEx Recumbent bike: seat 6 forward revolutions x 10 minutes Calf raises: x 10 holding 5 sec in parallel bars c bil UE support Seated LAQ 5# x 15 holding 3 sec Supine: bridges ball between knees, walking feet inward every 3 reps x 15 (max left knee flexion 110 deg) Supine knee flexion using strap and heel on ball x 10 holding end range 3 seconds TherAct Double Leg Press: 100# slow eccentrics 2 x 15 Single Leg Press, left LE:  56# 2 x 10 Step ups on 6 inch step leading with left LE in parallel bars bil UE support x 15  Lateral step ups (up and over 6 inch step) x 10 c bil UE support in parallel bars Modalities Vasopneumatic: 34 deg, medium compression, x 10 minutes      PATIENT EDUCATION:  eval Education details: see above  Person educated: Patient and Spouse Education method: Explanation and Verbal cues Education comprehension: verbalized understanding, returned demonstration, and needs further education  HOME EXERCISE PROGRAM: Access Code: [email or web address] URL: [email or web address] Date: 09/12/2023 Prepared by: Terral Ferrari  Exercises - Ankle Alphabet in Elevation  - 2-3 x daily - 7 x weekly - 1 sets - 1-3 reps - 15 minutes hold - Seated Knee Flexion Extension AROM   - 1-3 x daily - 7 x weekly - 1-3 sets - 10 reps - 5 seconds hold - Supine Heel Slide with Strap  - 1-3 x daily - 7 x weekly - 1-3 sets - 10 reps - 5 seconds hold - Gastroc  Stretch on Step  - 1-3 x daily - 7 x weekly - 1 sets - 3 reps - 30 seconds hold - Seated Hamstring Stretch with Strap  - 1-3 x daily - 7 x weekly - 1 sets - 3 reps - 30 seconds hold - Supine Quadriceps Stretch with Strap on Table  - 1-3 x daily - 7 x weekly - 1 sets - 3 reps - 30 seconds hold - Small Range Straight Leg Raise  - 3 x daily - 7 x weekly - 2-3 sets - 5 reps - 3 seconds hold - Supine Hamstring Stretch with Strap  - 1 x daily - 7 x weekly - 1 sets - 4-5 reps - 20 seconds hold    ASSESSMENT:  CLINICAL IMPRESSION: Improvement noted in TUG and 5 x sit to stand measurement updates.  Continued focus on progressive WB strengthening and balance improvements.  Good overall presentation progress noted at this time.    OBJECTIVE IMPAIRMENTS: Abnormal gait, decreased activity tolerance, decreased balance, decreased cognition, decreased knowledge of use of DME, decreased mobility, difficulty walking, decreased ROM, decreased strength, increased edema, increased fascial restrictions, impaired flexibility, obesity, and pain.   ACTIVITY LIMITATIONS: standing, squatting, stairs, transfers, bed mobility, and locomotion level  PARTICIPATION LIMITATIONS: driving, shopping, community activity, occupation, and yard work  PERSONAL FACTORS: Age, Education, Fitness, Past/current experiences, Profession, and Time since onset of injury/illness/exacerbation are also affecting patient's functional outcome.   REHAB POTENTIAL: Good  CLINICAL DECISION MAKING: Evolving/moderate complexity  EVALUATION COMPLEXITY: Moderate   GOALS: Goals reviewed with patient? No  SHORT TERM GOALS: Target date: 09/21/2023    Will be compliant with appropriate progressive HEP Goal status: Met 09/12/2023  2. L knee AROM flexion to be at least 115* and extension AROM to be no more than 3*  Goal status: Met 09/12/2023  3. Will be independent with edema management strategies  Goal status: MET 09/19/23   4. Gait pattern to  have  normalized without LRAD  Goal status: MET 09/19/23  LONG TERM GOALS: Target date: 10/19/2023    MMT to have improved by one grade all weak groups Goal status: Ongoing   08/30/2023   2. Will be able to perform floor to stand transfers without difficulty with no more than S  Goal status:Ongoing   08/30/2023   3. Will be able to ascend and descend stairs and ramp reciprocally with no increase in pain Goal status: Ongoing   09/12/2023   4. Will be able to ambulate community distances and perform all household tasks with no increase in pain  Goal status: Ongoing   09/12/2023   5. PSFS to have improved by at least 3 points to show improved QOL and subjective improvement  Goal status: Ongoing   08/30/2023  6. Will be able to walk for exercise as desired without increase from resting pain levels Goal status: Ongoing   09/12/2023    PLAN:  PT FREQUENCY: 2x/week  PT DURATION: 8 weeks  PLANNED INTERVENTIONS: 97164- PT Re-evaluation, 97110-Therapeutic exercises, 97530- Therapeutic activity, 97112- Neuromuscular re-education, 97535- Self Care, 16109- Manual therapy, [id number]- Gait training, [phone]- Electrical stimulation (unattended), [phone]- Ionotophoresis 4mg /ml Dexamethasone , Stair training, Taping, Dry Needling, Joint mobilization, and DME instructions  PLAN FOR NEXT SESSION: 10th visit medicare progress note.    Bonna Bustard, PT, DPT, OCS, ATC 09/25/23  10:01 AM

## 2023-09-28 ENCOUNTER — Encounter: Payer: Self-pay | Admitting: Physical Therapy

## 2023-09-28 ENCOUNTER — Ambulatory Visit: Admitting: Physical Therapy

## 2023-09-28 DIAGNOSIS — M25562 Pain in left knee: Secondary | ICD-10-CM

## 2023-09-28 DIAGNOSIS — M25662 Stiffness of left knee, not elsewhere classified: Secondary | ICD-10-CM

## 2023-09-28 DIAGNOSIS — R262 Difficulty in walking, not elsewhere classified: Secondary | ICD-10-CM

## 2023-09-28 DIAGNOSIS — R2681 Unsteadiness on feet: Secondary | ICD-10-CM

## 2023-09-28 DIAGNOSIS — R600 Localized edema: Secondary | ICD-10-CM

## 2023-09-28 NOTE — Therapy (Signed)
 OUTPATIENT PHYSICAL THERAPY  TREATMENT/PROGRESS NOTE   Patient Name: Luis Knapp MRN: [id number] DOB:1951/05/13, 73 y.o., male Today's Date: 09/28/2023  Progress Note Reporting Period 08/24/23 to 09/28/23  See note below for Objective Data and Assessment of Progress/Goals.      END OF SESSION:  PT End of Session - 09/28/23 0939     Visit Number 10    Number of Visits 17    Date for PT Re-Evaluation 10/19/23    Authorization Type MCR    Authorization Time Period 08/24/23 to 10/19/23    Progress Note Due on Visit 20    PT Start Time 0931    PT Stop Time 1011    PT Time Calculation (min) 40 min    Activity Tolerance Patient tolerated treatment well    Behavior During Therapy [facility] for tasks assessed/performed                  Past Medical History:  Diagnosis Date   Allergic rhinitis due to pollen    Allergy    Arthritis    Constipation 2018   Diverticulosis of colon (without mention of hemorrhage)    ED (erectile dysfunction)    Elevated PSA    GERD (gastroesophageal reflux disease)    History of colon polyps    2008--  PRE CANEROUS/   2013-- ADENOMA   Low back pain 2018   Muscle spasm 2018   Muscle weakness 2018   Post-void dribbling 2018   Prostate cancer (HCC) 11/01/2016   prostate cancer   [name]    Stress incontinence 2018   Unspecified essential hypertension    Urinary retention 2018   Wears glasses    Past Surgical History:  Procedure Laterality Date   COLONOSCOPY     COLONOSCOPY WITH PROPOFOL   10-30-2011  &  2008   POLYPECTOMY   HYDROCELE EXCISION Right 07/18/2013   Procedure: RIGHT HYDROCELECTOMY ADULT;  Surgeon: Andrez Banker, MD;  Location: [facility];  Service: Urology;  Laterality: Right;   LYMPH NODE DISSECTION Bilateral 11/01/2016   Procedure: BILATERAL PELVIC LYMPH NODE DISSECTION;  Surgeon: Andrez Banker, MD;  Location: [facility];  Service: Urology;  Laterality: Bilateral;   POLYPECTOMY      PROSTATE BIOPSY     ROBOT ASSISTED LAPAROSCOPIC RADICAL PROSTATECTOMY N/A 11/01/2016   Procedure: XI ROBOTIC ASSISTED LAPAROSCOPIC RADICAL PROSTATECTOMY;  Surgeon: Andrez Banker, MD;  Location: [facility];  Service: Urology;  Laterality: N/A;   TOTAL KNEE ARTHROPLASTY Left 08/10/2023   Procedure: ARTHROPLASTY, KNEE, TOTAL;  Surgeon: Arnie Lao, MD;  Location: [facility];  Service: Orthopedics;  Laterality: Left;  LEFT TOTAL KNEE ARTHROPLASTY  SPINAL + BLOCK   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   Medicare annual wellness visit, initial 10/18/2015   Hydrocele, right 06/24/2013   Routine general medical examination at a health care facility 12/04/2010   Morbid obesity (HCC) 05/18/2010   SCIATICA 05/18/2010   POLYP, COLON 08/03/2007   Essential hypertension 08/03/2007   Diverticulosis of colon 08/03/2007    PCP: Adra Alanis FNP   REFERRING PROVIDER: Arnie Lao, MD  REFERRING DIAG:  Diagnosis  [phone] (ICD-10-CM) - Status post total left knee replacement  M17.12 (ICD-10-CM) - Unilateral primary osteoarthritis,  left knee    THERAPY DIAG:  Acute pain of left knee  Stiffness of left knee, not elsewhere classified  Localized edema  Difficulty in walking, not elsewhere classified  Unsteadiness on feet  Rationale for Evaluation and Treatment: Rehabilitation  ONSET DATE: surgery 08/10/23  SUBJECTIVE:   SUBJECTIVE STATEMENT: Nothing new, feeling good    PERTINENT HISTORY: Lt TKA 08/10/23, OA, prostate CA, LBP  PAIN:  Are you having pain? 0/10 "just stiff"   PRECAUTIONS: Fall  RED FLAGS: None   WEIGHT BEARING RESTRICTIONS: No  FALLS:  Has patient fallen in last 6 months? No  LIVING  ENVIRONMENT: Lives with: lives with their spouse Lives in: House/apartment Stairs: 2 steps from carport into house, also has concrete ramp about 34ft (pt reports it is too steep) Has following equipment at home: Single point cane, Walker - 2 wheeled, Environmental consultant - 4 wheeled, and Ramped entry  OCCUPATION: retired   PLOF: Independent, Independent with basic ADLs, Independent with gait, and Independent with transfers  PATIENT GOALS: get back to PLOF- walking for exercise, be able to do floor transfers, build stamina, navigate steps and ramp safely   NEXT MD VISIT: May 12th with Dr. Lucienne Ryder  OBJECTIVE:  Note: Objective measures were completed at Evaluation unless otherwise noted.  DIAGNOSTIC FINDINGS:    IMPRESSION: 1. Two tiny foci of radiotracer activity in the pelvis are concerning for prostate cancer metastasis. 2. No evidence of local prostate cancer recurrence in the prostate bed. 3. No evidence of visceral metastasis or skeletal metastasis.  PATIENT SURVEYS:    Patient-Specific Activity Scoring Scheme  "0" represents "unable to perform." "10" represents "able to perform at prior level. 0 1 2 3 4 5 6 7 8 9  10 (Date and Score)     Activity Eval  09/25/2023   1. Walking 1.5 miles for exercise   0  7  2. Getting up and down from the floor  0   7  3. Stamina  3  5  4.Steps and ramp navigation 3 7   5.      Score 1.5  6.5 avg    Total score = sum of the activity scores/number of activities Minimum detectable change (90%CI) for average score = 2 points Minimum detectable change (90%CI) for single activity score = 3 points   LOWER EXTREMITY ROM:  Active ROM Left eval Left 08/30/23 Left  09/07/23 Left 09/12/2023 Left 09/19/23 Left 09/28/23 seated edge of mat table   Hip flexion        Hip extension        Hip abduction        Hip adduction        Hip internal rotation        Hip external rotation        Knee flexion 101* supine heel slide  Supine heel slide 105* Supine heel  slide 108* 105 supine Supine A:110 AROM 108*   Knee extension 7* supine quad set with heel prop   6* supine quad set with heel prop  Lacking 3 degrees (-3*) A: -3 deg AROM 4* LAQ   Ankle dorsiflexion        Ankle plantarflexion        Ankle inversion        Ankle eversion         (Blank rows = not tested)  LOWER EXTREMITY MMT:  MMT Left eval Rt 09/19/23 Left 09/19/23 Sitting HHD Left 09/28/23  Hip flexion 3+  5  Hip extension      Hip abduction      Hip adduction      Hip internal rotation      Hip external rotation      Knee flexion  45.2, 50.1 32.4, 36.0 5  Knee extension 4 58.9, 63.5 47.4, 48.2 5  Ankle dorsiflexion 5     Ankle plantarflexion      Ankle inversion      Ankle eversion       (Blank rows = not tested)  FUNCTIONAL TESTS:  09/25/2023: TUG Independent:  13.42 seconds 5x sit to stand: 20.59 seconds, no UE 09/28/23- floor to stand Mod(I)   08/24/2023 Evaluation:  5 times sit to stand: 47 seconds no UEs  Timed up and go (TUG): 33 seconds 4WW   GAIT: 08/24/2023 Evaluation:  Distance walked: in clinic distances  Assistive device utilized: Environmental consultant - 4 wheeled Level of assistance: Modified independence Comments: slow gait pattern, intermittent heel toe, very slow pace of pain, some toe out B                    TODAY'S TREATMENT                                                                          DATE:   09/28/23  Precor bike seat 5 x10 minutes full rotations Forward step ups 8 inch box x10 B Forward lunges 8 inch box x10 B MMT, ROM, floor to stand for progress note  Education on POC, progress with PT- he is really doing well with therapy, anticipate that barring any random setbacks that he will be ready for DC to advanced HEP at end of currently scheduled sessions, demonstrated 2 exercises he can do at home with new ankle weights and made sure these were on HEP    In // bars:  Rocker board x2 minutes lateral Rocker board x2 minutes AP Tandem stance blue  foam pad 3x30 seconds B Side steps blue foam pad x4 laps              09/25/2023 Therex: Recumbent bike lvl 6 8 mins ROM seat 5  Inciline gastroc stretch 30 sec x 3  Seated quad set with SLR x 15 bilateral with slow movement focus.   TherActivity (to improve stairs, transfers, squatting) Step on over and down 6 inch step no UE assist WB on Lt leg x 15  Leg press Lt only 2 x 15 56 lbs  5 x sit to stand from 18 inch chair s UE assist.   Neuro Re-ed Lt SLS blaze pod 3 lights anterior semi circle with tapping with Rt leg 20 sec x 4  Tandem stance on foam in // bars 1 min x 1 bilateral with occasional HHA on bar for corrections.  Tandem ambulation on foam in // bars 6 ft x 5 each way with occasional HHA on bar   Vasopneumatic device  Deferred today    TODAY'S TREATMENT  DATE: 09/21/23  Scifit bike x10 min full rotations seat 6 (Precore not available) for ROM, w/u and tissue perfusion  Shuttle LE press BLEs 100# x15  focus on eccentric lower  Shuttle LE press single leg 56# x15 B   LAQ ladder: 5#x15, 7# x10, 9# x5 HS stretches 2x30 seconds B standing     Tandem stance blue foam pad 2x30 seconds B  Tandem walks in // bars 4 laps solid surface   TODAY'S TREATMENT                                                                          DATE:09/19/23 TherEx Recumbent bike: seat 6 forward revolutions x 10 minutes Calf raises: x 10 holding 5 sec in parallel bars c bil UE support Seated LAQ 5# x 15 holding 3 sec Supine: bridges ball between knees, walking feet inward every 3 reps x 15 (max left knee flexion 110 deg) Supine knee flexion using strap and heel on ball x 10 holding end range 3 seconds TherAct Double Leg Press: 100# slow eccentrics 2 x 15 Single Leg Press, left LE:  56# 2 x 10 Step ups on 6 inch step leading with left LE in parallel bars bil UE support x 15  Lateral step ups (up and over 6  inch step) x 10 c bil UE support in parallel bars Modalities Vasopneumatic: 34 deg, medium compression, x 10 minutes      PATIENT EDUCATION:  eval Education details: see above  Person educated: Patient and Spouse Education method: Explanation and Verbal cues Education comprehension: verbalized understanding, returned demonstration, and needs further education  HOME EXERCISE PROGRAM:   Access Code: [email or web address] URL: [email or web address] Date: 09/28/2023 Prepared by: Terrel Ferries  Exercises - Ankle Alphabet in Elevation  - 2-3 x daily - 7 x weekly - 1 sets - 1-3 reps - 15 minutes hold - Seated Knee Flexion Extension AROM   - 1-3 x daily - 7 x weekly - 1-3 sets - 10 reps - 5 seconds hold - Supine Heel Slide with Strap  - 1-3 x daily - 7 x weekly - 1-3 sets - 10 reps - 5 seconds hold - Gastroc Stretch on Step  - 1-3 x daily - 7 x weekly - 1 sets - 3 reps - 30 seconds hold - Seated Hamstring Stretch with Strap  - 1-3 x daily - 7 x weekly - 1 sets - 3 reps - 30 seconds hold - Supine Quadriceps Stretch with Strap on Table  - 1-3 x daily - 7 x weekly - 1 sets - 3 reps - 30 seconds hold - Small Range Straight Leg Raise  - 3 x daily - 7 x weekly - 2-3 sets - 5 reps - 3 seconds hold - Supine Hamstring Stretch with Strap  - 1 x daily - 7 x weekly - 1 sets - 4-5 reps - 20 seconds hold - Seated Long Arc Quad with Ankle Weight  - 1 x daily - 7 x weekly - 2 sets - 10 reps - 2 seconds  hold - Prone Hamstring Curl with Ankle Weight  - 1 x daily - 7 x weekly - 2 sets - 10 reps - 2 seconds  hold  ASSESSMENT:  CLINICAL IMPRESSION:  Got some objective measures for progress note this morning- doing really well and sounds like the MD was happy with his progress. Continued focus on functional strength and balance this morning. He feels he is making great but slow progress. We discussed that some impairments and pains are from the general healing process following TKR, he feels he is at  about 75% right now. He has met the majority of goals, with those yet unmet simply related to not having tried certain activities yet or still having good and bad days in the post-op healing process as anticipated.   OBJECTIVE IMPAIRMENTS: Abnormal gait, decreased activity tolerance, decreased balance, decreased cognition, decreased knowledge of use of DME, decreased mobility, difficulty walking, decreased ROM, decreased strength, increased edema, increased fascial restrictions, impaired flexibility, obesity, and pain.   ACTIVITY LIMITATIONS: standing, squatting, stairs, transfers, bed mobility, and locomotion level  PARTICIPATION LIMITATIONS: driving, shopping, community activity, occupation, and yard work  PERSONAL FACTORS: Age, Education, Fitness, Past/current experiences, Profession, and Time since onset of injury/illness/exacerbation are also affecting patient's functional outcome.   REHAB POTENTIAL: Good  CLINICAL DECISION MAKING: Evolving/moderate complexity  EVALUATION COMPLEXITY: Moderate   GOALS: Goals reviewed with patient? No  SHORT TERM GOALS: Target date: 09/21/2023    Will be compliant with appropriate progressive HEP Goal status: Met 09/12/2023  2. L knee AROM flexion to be at least 115* and extension AROM to be no more than 3*  Goal status: Met 09/12/2023  3. Will be independent with edema management strategies  Goal status: MET 09/19/23   4. Gait pattern to have normalized without LRAD  Goal status: MET 09/19/23  LONG TERM GOALS: Target date: 10/19/2023    MMT to have improved by one grade all weak groups Goal status: MET 09/28/23   2. Will be able to perform floor to stand transfers without difficulty with no more than S  Goal status: MET 09/28/23   3. Will be able to ascend and descend stairs and ramp reciprocally with no increase in pain Goal status: Ongoing   09/28/23 depends on the day/knee stiffness and pain at the time    4. Will be able to ambulate  community distances and perform all household tasks with no increase in pain  Goal status: Ongoing   09/28/23 hasn't tried a lot yet    5. PSFS to have improved by at least 3 points to show improved QOL and subjective improvement  Goal status: MET 09/28/23  6. Will be able to walk for exercise as desired without increase from resting pain levels Goal status: Ongoing   09/28/23 encouraged this today, hasn't tried yet     PLAN:  PT FREQUENCY: 2x/week  PT DURATION: 8 weeks  PLANNED INTERVENTIONS: 97164- PT Re-evaluation, 97110-Therapeutic exercises, 97530- Therapeutic activity, 97112- Neuromuscular re-education, 97535- Self Care, [id number]- Manual therapy, [id number]- Gait training, [phone]- Electrical stimulation (unattended), [phone]- Ionotophoresis 4mg /ml Dexamethasone , Stair training, Taping, Dry Needling, Joint mobilization, and DME instructions  PLAN FOR NEXT SESSION: continue working on advanced strength and balance, keep building out HEP (likely DC to advanced HEP on 10/12/23). Monitor R knee pain/adjust interventions PRN   Terrel Ferries, PT, DPT 09/28/23 10:11 AM

## 2023-09-29 ENCOUNTER — Encounter: Payer: Self-pay | Admitting: Family

## 2023-10-01 ENCOUNTER — Encounter: Payer: Self-pay | Admitting: Rehabilitative and Restorative Service Providers"

## 2023-10-01 ENCOUNTER — Ambulatory Visit: Admitting: Rehabilitative and Restorative Service Providers"

## 2023-10-01 DIAGNOSIS — M25562 Pain in left knee: Secondary | ICD-10-CM

## 2023-10-01 DIAGNOSIS — R262 Difficulty in walking, not elsewhere classified: Secondary | ICD-10-CM

## 2023-10-01 DIAGNOSIS — M25662 Stiffness of left knee, not elsewhere classified: Secondary | ICD-10-CM

## 2023-10-01 DIAGNOSIS — R600 Localized edema: Secondary | ICD-10-CM | POA: Diagnosis not present

## 2023-10-01 DIAGNOSIS — R2681 Unsteadiness on feet: Secondary | ICD-10-CM

## 2023-10-01 NOTE — Therapy (Signed)
 OUTPATIENT PHYSICAL THERAPY  TREATMENT  Patient Name: Luis Knapp MRN: [id number] DOB:Mar 03, 1951, 73 y.o., male Today's Date: 10/01/2023  END OF SESSION:  PT End of Session - 10/01/23 0914     Visit Number 11    Number of Visits 17    Date for PT Re-Evaluation 10/19/23    Authorization Type MCR    Authorization Time Period 08/24/23 to 10/19/23    Progress Note Due on Visit 20    PT Start Time 0924    PT Stop Time 1003    PT Time Calculation (min) 39 min    Activity Tolerance Patient tolerated treatment well    Behavior During Therapy [facility] for tasks assessed/performed                   Past Medical History:  Diagnosis Date   Allergic rhinitis due to pollen    Allergy    Arthritis    Constipation 2018   Diverticulosis of colon (without mention of hemorrhage)    ED (erectile dysfunction)    Elevated PSA    GERD (gastroesophageal reflux disease)    History of colon polyps    2008--  PRE CANEROUS/   2013-- ADENOMA   Low back pain 2018   Muscle spasm 2018   Muscle weakness 2018   Post-void dribbling 2018   Prostate cancer (HCC) 11/01/2016   prostate cancer   [name]    Stress incontinence 2018   Unspecified essential hypertension    Urinary retention 2018   Wears glasses    Past Surgical History:  Procedure Laterality Date   COLONOSCOPY     COLONOSCOPY WITH PROPOFOL   10-30-2011  &  2008   POLYPECTOMY   HYDROCELE EXCISION Right 07/18/2013   Procedure: RIGHT HYDROCELECTOMY ADULT;  Surgeon: Andrez Banker, MD;  Location: [facility];  Service: Urology;  Laterality: Right;   LYMPH NODE DISSECTION Bilateral 11/01/2016   Procedure: BILATERAL PELVIC LYMPH NODE DISSECTION;  Surgeon: Andrez Banker, MD;  Location: [facility];  Service: Urology;  Laterality: Bilateral;   POLYPECTOMY     PROSTATE BIOPSY     ROBOT ASSISTED LAPAROSCOPIC RADICAL PROSTATECTOMY N/A 11/01/2016   Procedure: XI ROBOTIC ASSISTED LAPAROSCOPIC RADICAL  PROSTATECTOMY;  Surgeon: Andrez Banker, MD;  Location: [facility];  Service: Urology;  Laterality: N/A;   TOTAL KNEE ARTHROPLASTY Left 08/10/2023   Procedure: ARTHROPLASTY, KNEE, TOTAL;  Surgeon: Arnie Lao, MD;  Location: [facility];  Service: Orthopedics;  Laterality: Left;  LEFT TOTAL KNEE ARTHROPLASTY  SPINAL + BLOCK   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   Medicare annual wellness visit, initial 10/18/2015   Hydrocele, right 06/24/2013   Routine general medical examination at a health care facility 12/04/2010   Morbid obesity (HCC) 05/18/2010   SCIATICA 05/18/2010   POLYP, COLON 08/03/2007   Essential hypertension 08/03/2007   Diverticulosis of colon 08/03/2007    PCP: Adra Alanis FNP   REFERRING PROVIDER: Arnie Lao, MD  REFERRING DIAG:  Diagnosis  [phone] (ICD-10-CM) - Status post total left knee replacement  M17.12 (ICD-10-CM) - Unilateral primary osteoarthritis, left knee    THERAPY DIAG:  Acute pain of left knee  Stiffness of left knee, not elsewhere classified  Localized edema  Difficulty in walking, not elsewhere classified  Unsteadiness on feet  Rationale for Evaluation and Treatment: Rehabilitation  ONSET DATE: surgery 08/10/23  SUBJECTIVE:   SUBJECTIVE STATEMENT: Pt indicated having no specific pain upon arrival.  Reported feeling some soreness in both knees.  Pt indicated trying to go without pain medicine at night and sometimes has trouble with sleeping.   Reported some variable ability to perform floor transfers and ambulation.  Reported about 70-75% overall improvement.    PERTINENT HISTORY: Lt TKA 08/10/23, OA, prostate CA, LBP  PAIN:   NPRS scale: 0/10 Pain  location: Lt knee  Pain description: sore  Aggravating factors: stairs, nighttime Relieving factors: nothing specific indicated   PRECAUTIONS: Fall  RED FLAGS: None   WEIGHT BEARING RESTRICTIONS: No  FALLS:  Has patient fallen in last 6 months? No  LIVING ENVIRONMENT: Lives with: lives with their spouse Lives in: House/apartment Stairs: 2 steps from carport into house, also has concrete ramp about 29ft (pt reports it is too steep) Has following equipment at home: Single point cane, Walker - 2 wheeled, Environmental consultant - 4 wheeled, and Ramped entry  OCCUPATION: retired   PLOF: Independent, Independent with basic ADLs, Independent with gait, and Independent with transfers  PATIENT GOALS: get back to PLOF- walking for exercise, be able to do floor transfers, build stamina, navigate steps and ramp safely   OBJECTIVE:  Note: Objective measures were completed at Evaluation unless otherwise noted.  DIAGNOSTIC FINDINGS:    IMPRESSION: 1. Two tiny foci of radiotracer activity in the pelvis are concerning for prostate cancer metastasis. 2. No evidence of local prostate cancer recurrence in the prostate bed. 3. No evidence of visceral metastasis or skeletal metastasis.  PATIENT SURVEYS:    Patient-Specific Activity Scoring Scheme  "0" represents "unable to perform." "10" represents "able to perform at prior level. 0 1 2 3 4 5 6 7 8 9  10 (Date and Score)     Activity Eval  09/25/2023   1. Walking 1.5 miles for exercise   0  7  2. Getting up and down from the floor  0   7  3. Stamina  3  5  4.Steps and ramp navigation 3 7   5.      Score 1.5  6.5 avg    Total score = sum of the activity scores/number of activities Minimum detectable change (90%CI) for average score = 2 points Minimum detectable change (90%CI) for single activity score = 3 points   LOWER EXTREMITY ROM:  Active ROM Left eval Left 08/30/23 Left  09/07/23 Left 09/12/2023 Left 09/19/23 Left 09/28/23 seated edge of mat  table   Hip flexion        Hip extension        Hip abduction        Hip adduction        Hip internal rotation        Hip external rotation        Knee flexion 101* supine heel slide  Supine heel slide 105* Supine heel slide 108* 105 supine Supine A:110 AROM 108*   Knee extension 7* supine quad set with heel prop   6* supine quad set with heel prop  Lacking 3 degrees (-3*) A: -3 deg AROM -4* LAQ   Ankle dorsiflexion        Ankle plantarflexion        Ankle inversion        Ankle eversion         (  Blank rows = not tested)  LOWER EXTREMITY MMT:  MMT Left eval Rt 09/19/23 Left 09/19/23 Sitting HHD Left 09/28/23  Hip flexion 3+   5  Hip extension      Hip abduction      Hip adduction      Hip internal rotation      Hip external rotation      Knee flexion  45.2, 50.1 32.4, 36.0 5  Knee extension 4 58.9, 63.5 47.4, 48.2 5  Ankle dorsiflexion 5     Ankle plantarflexion      Ankle inversion      Ankle eversion       (Blank rows = not tested)  FUNCTIONAL TESTS:  09/28/23- floor to stand Mod(I)  09/25/2023: TUG Independent:  13.42 seconds 5x sit to stand: 20.59 seconds, no UE    08/24/2023 Evaluation:  5 times sit to stand: 47 seconds no UEs  Timed up and go (TUG): 33 seconds 4WW   GAIT: 10/01/2023: Ambulation in clinic independently   08/24/2023 Evaluation:  Distance walked: in clinic distances  Assistive device utilized: Environmental consultant - 4 wheeled Level of assistance: Modified independence Comments: slow gait pattern, intermittent heel toe, very slow pace of pain, some toe out B                    TODAY'S TREATMENT                                                                          DATE: 10/01/2023 Therex: Recumbent bike lvl 5 10 mins ROM seat 6 Inciline gastroc stretch 30 sec x 3 Tailgate flexion swinging in chair 1 min   TherActivity (to improve stairs, transfers, squatting) Stair navigation flight with Rt hand assist lightly reciprocal gait pattern up/down x 1  Leg  press double leg 100 lbs x 15, Lt only 2 x 15 56 lbs   Neuro Re-ed SLS on black mat with contralateral leg tapping each corner x 8 bilaterally with occasional HHA  SLS with contralateral leg step over and back 6 inch hurdle x 10 bilateral Lateral step over 6 inch hurdle x 10 each way   TODAY'S TREATMENT                                                                          DATE: 09/28/23 Precor bike seat 5 x10 minutes full rotations Forward step ups 8 inch box x10 B Forward lunges 8 inch box x10 B MMT, ROM, floor to stand for progress note  Education on POC, progress with PT- he is really doing well with therapy, anticipate that barring any random setbacks that he will be ready for DC to advanced HEP at end of currently scheduled sessions, demonstrated 2 exercises he can do at home with new ankle weights and made sure these were on HEP    In // bars:  Rocker board x2 minutes lateral Rocker board x2 minutes AP Tandem  stance blue foam pad 3x30 seconds B Side steps blue foam pad x4 laps    09/25/2023 Therex: Recumbent bike lvl 6 8 mins ROM seat 5  Inciline gastroc stretch 30 sec x 3  Seated quad set with SLR x 15 bilateral with slow movement focus.   TherActivity (to improve stairs, transfers, squatting) Step on over and down 6 inch step no UE assist WB on Lt leg x 15  Leg press Lt only 2 x 15 56 lbs  5 x sit to stand from 18 inch chair s UE assist.   Neuro Re-ed Lt SLS blaze pod 3 lights anterior semi circle with tapping with Rt leg 20 sec x 4  Tandem stance on foam in // bars 1 min x 1 bilateral with occasional HHA on bar for corrections.  Tandem ambulation on foam in // bars 6 ft x 5 each way with occasional HHA on bar   Vasopneumatic device  Deferred today    PATIENT EDUCATION:  eval Education details: see above  Person educated: Patient and Spouse Education method: Explanation and Verbal cues Education comprehension: verbalized understanding, returned demonstration,  and needs further education  HOME EXERCISE PROGRAM:   Access Code: [email or web address] URL: [email or web address] Date: 09/28/2023 Prepared by: Terrel Ferries  Exercises - Ankle Alphabet in Elevation  - 2-3 x daily - 7 x weekly - 1 sets - 1-3 reps - 15 minutes hold - Seated Knee Flexion Extension AROM   - 1-3 x daily - 7 x weekly - 1-3 sets - 10 reps - 5 seconds hold - Supine Heel Slide with Strap  - 1-3 x daily - 7 x weekly - 1-3 sets - 10 reps - 5 seconds hold - Gastroc Stretch on Step  - 1-3 x daily - 7 x weekly - 1 sets - 3 reps - 30 seconds hold - Seated Hamstring Stretch with Strap  - 1-3 x daily - 7 x weekly - 1 sets - 3 reps - 30 seconds hold - Supine Quadriceps Stretch with Strap on Table  - 1-3 x daily - 7 x weekly - 1 sets - 3 reps - 30 seconds hold - Small Range Straight Leg Raise  - 3 x daily - 7 x weekly - 2-3 sets - 5 reps - 3 seconds hold - Supine Hamstring Stretch with Strap  - 1 x daily - 7 x weekly - 1 sets - 4-5 reps - 20 seconds hold - Seated Long Arc Quad with Ankle Weight  - 1 x daily - 7 x weekly - 2 sets - 10 reps - 2 seconds  hold - Prone Hamstring Curl with Ankle Weight  - 1 x daily - 7 x weekly - 2 sets - 10 reps - 2 seconds  hold    ASSESSMENT:  CLINICAL IMPRESSION: Discussed HEP transitioning plan when appropriate with goals of continued HEP for gains going forward after discharge. Pt indicated MD had release.  Stair navigation showed more trouble in WB on Rt than Lt today.   OBJECTIVE IMPAIRMENTS: Abnormal gait, decreased activity tolerance, decreased balance, decreased cognition, decreased knowledge of use of DME, decreased mobility, difficulty walking, decreased ROM, decreased strength, increased edema, increased fascial restrictions, impaired flexibility, obesity, and pain.   ACTIVITY LIMITATIONS: standing, squatting, stairs, transfers, bed mobility, and locomotion level  PARTICIPATION LIMITATIONS: driving, shopping, community activity, occupation,  and yard work  PERSONAL FACTORS: Age, Education, Fitness, Past/current experiences, Profession, and Time since onset of injury/illness/exacerbation are also affecting patient's functional outcome.  REHAB POTENTIAL: Good  CLINICAL DECISION MAKING: Evolving/moderate complexity  EVALUATION COMPLEXITY: Moderate   GOALS: Goals reviewed with patient? No  SHORT TERM GOALS: Target date: 09/21/2023    Will be compliant with appropriate progressive HEP Goal status: Met 09/12/2023  2. L knee AROM flexion to be at least 115* and extension AROM to be no more than 3*  Goal status: Met 09/12/2023  3. Will be independent with edema management strategies  Goal status: MET 09/19/23   4. Gait pattern to have normalized without LRAD  Goal status: MET 09/19/23  LONG TERM GOALS: Target date: 10/19/2023    MMT to have improved by one grade all weak groups Goal status: MET 09/28/23   2. Will be able to perform floor to stand transfers without difficulty with no more than S  Goal status: MET 09/28/23   3. Will be able to ascend and descend stairs and ramp reciprocally with no increase in pain Goal status: Ongoing   09/28/23 depends on the day/knee stiffness and pain at the time    4. Will be able to ambulate community distances and perform all household tasks with no increase in pain  Goal status: Ongoing   09/28/23 hasn't tried a lot yet    5. PSFS to have improved by at least 3 points to show improved QOL and subjective improvement  Goal status: MET 09/28/23  6. Will be able to walk for exercise as desired without increase from resting pain levels Goal status: Ongoing   09/28/23 encouraged this today, hasn't tried yet     PLAN:  PT FREQUENCY: 2x/week  PT DURATION: 8 weeks  PLANNED INTERVENTIONS: 97164- PT Re-evaluation, 97110-Therapeutic exercises, 97530- Therapeutic activity, 97112- Neuromuscular re-education, 97535- Self Care, 96045- Manual therapy, [phone]- Gait training, [phone]- Electrical  stimulation (unattended), [phone]- Ionotophoresis 4mg /ml Dexamethasone , Stair training, Taping, Dry Needling, Joint mobilization, and DME instructions  PLAN FOR NEXT SESSION:  HEP transitioning prep continued  Bonna Bustard, PT, DPT, OCS, ATC 10/01/23  10:04 AM

## 2023-10-02 ENCOUNTER — Telehealth: Payer: Self-pay | Admitting: Gastroenterology

## 2023-10-02 ENCOUNTER — Encounter: Payer: Self-pay | Admitting: Family

## 2023-10-02 ENCOUNTER — Ambulatory Visit (INDEPENDENT_AMBULATORY_CARE_PROVIDER_SITE_OTHER): Payer: Medicare Other | Admitting: Family

## 2023-10-02 VITALS — BP 118/62 | HR 90 | Ht 66.0 in | Wt 228.6 lb

## 2023-10-02 DIAGNOSIS — I10 Essential (primary) hypertension: Secondary | ICD-10-CM | POA: Diagnosis not present

## 2023-10-02 NOTE — Telephone Encounter (Signed)
 He is due 08/2025 - I am not sure why he was given a recall for colonoscopy at this time, if you can update his recall date. Thanks

## 2023-10-02 NOTE — Telephone Encounter (Signed)
 Good morning Dr. General Kenner,   I received a call from this patient wishing to get clarification on when he would be due for his colonoscopy. Patient states per his previous colonoscopy in 2022, you stated he wouldn't be due until five years, so 2027. Patient received a letter on 5/2 of this month, stating that he was due for a colonoscopy and to call and get scheduled. He would like to know if he should schedule now or go with your previous recommendation of a 5 year recall. Please advise.   Thank you.

## 2023-10-02 NOTE — Progress Notes (Signed)
 Luis Knapp is a 73 y.o. 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     Coenzyme Q10 (COQ-10) 100 MG CAPS Take 100 mg by mouth 2 (two) times daily.     rosuvastatin  (CRESTOR ) 10 MG tablet Take 1 tablet (10 mg total) by mouth daily. 90 tablet 3   tiZANidine  (ZANAFLEX ) 2 MG tablet Take 1 tablet (2 mg total) by mouth every 6 (six) hours as needed for muscle spasms. 30 tablet 1   valsartan -hydrochlorothiazide  (DIOVAN -HCT) 320-25 MG tablet Take 1 tablet by mouth daily. 90 tablet 3   amLODipine  (NORVASC ) 5 MG tablet Take 1 tablet (5 mg total) by mouth at bedtime. (Patient not taking: Reported on 10/02/2023) 90 tablet 3   No current facility-administered medications for this visit.    Allergies: Dilaudid  [hydromorphone ]  Past Medical History:  Diagnosis Date   Allergic rhinitis due to pollen    Allergy    Arthritis    Constipation 2018   Diverticulosis of colon (without mention of hemorrhage)    ED (erectile dysfunction)    Elevated PSA    GERD (gastroesophageal reflux disease)    History of colon polyps    2008--  PRE CANEROUS/   2013-- ADENOMA    Low back pain 2018   Muscle spasm 2018   Muscle weakness 2018   Post-void dribbling 2018   Prostate cancer (HCC) 11/01/2016   prostate cancer   [name]    Stress incontinence 2018   Unspecified essential hypertension    Urinary retention 2018   Wears glasses     Past Surgical History:  Procedure Laterality Date   COLONOSCOPY     COLONOSCOPY WITH PROPOFOL   10-30-2011  &  2008   POLYPECTOMY   HYDROCELE EXCISION Right 07/18/2013   Procedure: RIGHT HYDROCELECTOMY ADULT;  Surgeon: Andrez Banker, MD;  Location: [facility];  Service: Urology;  Laterality: Right;   LYMPH NODE DISSECTION Bilateral 11/01/2016   Procedure: BILATERAL PELVIC LYMPH NODE DISSECTION;  Surgeon: Andrez Banker, MD;  Location: [facility];  Service: Urology;  Laterality: Bilateral;   POLYPECTOMY     PROSTATE BIOPSY     ROBOT ASSISTED LAPAROSCOPIC RADICAL PROSTATECTOMY N/A 11/01/2016   Procedure: XI ROBOTIC ASSISTED LAPAROSCOPIC RADICAL PROSTATECTOMY;  Surgeon: Andrez Banker, MD;  Location: [facility];  Service: Urology;  Laterality: N/A;   TOTAL KNEE ARTHROPLASTY Left 08/10/2023   Procedure: ARTHROPLASTY, KNEE, TOTAL;  Surgeon: Arnie Lao, MD;  Location: [facility];  Service: Orthopedics;  Laterality: Left;  LEFT TOTAL KNEE ARTHROPLASTY  SPINAL + BLOCK    Family History  Problem Relation Age of Onset   Coronary artery disease Father  Heart attack Father        2nd one fatal   Pulmonary embolism Father        ?   Dementia Father    Heart disease Father    Colon cancer Father    Dementia Mother    Hypertension Mother    Macular degeneration Mother    Kidney disease Mother    Diabetes Sister    Non-Hodgkin's lymphoma Sister    Colon cancer Maternal Grandfather    Pancreatic cancer Sister    Prostate cancer Neg Hx    Rectal cancer Neg Hx    Colon polyps Neg Hx    Esophageal cancer Neg Hx     Social History   Tobacco Use   Smoking status: Former    Current  packs/day: 0.00    Types: Cigarettes    Quit date: 10/17/1995    Years since quitting: 27.9   Smokeless tobacco: Never  Substance Use Topics   Alcohol use: Not Currently    Subjective:   Follow up on hypertension; at last OV, Amlodipine  was held; patient sent readings yesterday that showed control was good/ has been feeling better; Denies any chest pain, shortness of breath, blurred vision or headache.   Objective:  Vitals:   10/02/23 0805  BP: 118/62  Pulse: 90  SpO2: 98%  Weight: 228 lb 9.6 oz (103.7 kg)  Height: 5\' 6"  (1.676 m)    General: Well developed, well nourished, in no acute distress  Skin : Warm and dry.  Head: Normocephalic and atraumatic  Lungs: Respirations unlabored; clear to auscultation bilaterally without wheeze, rales, rhonchi  CVS exam: normal rate and regular rhythm.  Extremities: No edema, cyanosis, clubbing  Vessels: Symmetric bilaterally  Neurologic: Alert and oriented; speech intact; face symmetrical; moves all extremities well; CNII-XII intact without focal deficit   Assessment:  1. Essential hypertension     Plan:  Stable; doing very well on Diovan  HCT; continue to check readings at home; follow up in 6 months, sooner prn.  He will reach out to GI about discrepancy for colonoscopy scheduling- was told in 2022 that needed 5 year follow up but received letter recently stating due for 3 year follow up.   No follow-ups on file.  No orders of the defined types were placed in this encounter.   Requested Prescriptions    No prescriptions requested or ordered in this encounter

## 2023-10-03 ENCOUNTER — Encounter: Payer: Self-pay | Admitting: Family Medicine

## 2023-10-03 NOTE — Telephone Encounter (Signed)
 Patient was advised of recall 08/2025.

## 2023-10-03 NOTE — Telephone Encounter (Signed)
 Hey, can we please check benefit for Zilretta  for BILAT knee OA.   Last Zilretta  injections 03/15/23

## 2023-10-05 ENCOUNTER — Encounter: Payer: Self-pay | Admitting: Physical Therapy

## 2023-10-05 ENCOUNTER — Ambulatory Visit (INDEPENDENT_AMBULATORY_CARE_PROVIDER_SITE_OTHER): Admitting: Physical Therapy

## 2023-10-05 DIAGNOSIS — M25562 Pain in left knee: Secondary | ICD-10-CM

## 2023-10-05 DIAGNOSIS — M25662 Stiffness of left knee, not elsewhere classified: Secondary | ICD-10-CM | POA: Diagnosis not present

## 2023-10-05 DIAGNOSIS — R262 Difficulty in walking, not elsewhere classified: Secondary | ICD-10-CM

## 2023-10-05 DIAGNOSIS — R600 Localized edema: Secondary | ICD-10-CM | POA: Diagnosis not present

## 2023-10-05 DIAGNOSIS — R2681 Unsteadiness on feet: Secondary | ICD-10-CM

## 2023-10-05 NOTE — Telephone Encounter (Signed)
 Zilretta  ran on 10/02/21. Case number [id number].

## 2023-10-05 NOTE — Therapy (Signed)
 OUTPATIENT PHYSICAL THERAPY  TREATMENT  Patient Name: Luis Knapp MRN: [id number] DOB:May 22, 1950, 73 y.o., male Today's Date: 10/05/2023  END OF SESSION:  PT End of Session - 10/05/23 0940     Visit Number 12    Number of Visits 14    Authorization Type MCR    Authorization Time Period 08/24/23 to 10/19/23    Progress Note Due on Visit 20    PT Start Time 0932    PT Stop Time 1010    PT Time Calculation (min) 38 min    Activity Tolerance Patient tolerated treatment well    Behavior During Therapy [facility] for tasks assessed/performed                    Past Medical History:  Diagnosis Date   Allergic rhinitis due to pollen    Allergy    Arthritis    Constipation 2018   Diverticulosis of colon (without mention of hemorrhage)    ED (erectile dysfunction)    Elevated PSA    GERD (gastroesophageal reflux disease)    History of colon polyps    2008--  PRE CANEROUS/   2013-- ADENOMA   Low back pain 2018   Muscle spasm 2018   Muscle weakness 2018   Post-void dribbling 2018   Prostate cancer (HCC) 11/01/2016   prostate cancer   [name]    Stress incontinence 2018   Unspecified essential hypertension    Urinary retention 2018   Wears glasses    Past Surgical History:  Procedure Laterality Date   COLONOSCOPY     COLONOSCOPY WITH PROPOFOL   10-30-2011  &  2008   POLYPECTOMY   HYDROCELE EXCISION Right 07/18/2013   Procedure: RIGHT HYDROCELECTOMY ADULT;  Surgeon: Andrez Banker, MD;  Location: [facility];  Service: Urology;  Laterality: Right;   LYMPH NODE DISSECTION Bilateral 11/01/2016   Procedure: BILATERAL PELVIC LYMPH NODE DISSECTION;  Surgeon: Andrez Banker, MD;  Location: [facility];  Service: Urology;  Laterality: Bilateral;   POLYPECTOMY     PROSTATE BIOPSY     ROBOT ASSISTED LAPAROSCOPIC RADICAL PROSTATECTOMY N/A 11/01/2016   Procedure: XI ROBOTIC ASSISTED LAPAROSCOPIC RADICAL PROSTATECTOMY;  Surgeon: Andrez Banker,  MD;  Location: [facility];  Service: Urology;  Laterality: N/A;   TOTAL KNEE ARTHROPLASTY Left 08/10/2023   Procedure: ARTHROPLASTY, KNEE, TOTAL;  Surgeon: Arnie Lao, MD;  Location: [facility];  Service: Orthopedics;  Laterality: Left;  LEFT TOTAL KNEE ARTHROPLASTY  SPINAL + BLOCK   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   Medicare annual wellness visit, initial 10/18/2015   Hydrocele, right 06/24/2013   Routine general medical examination at a health care facility 12/04/2010   Morbid obesity (HCC) 05/18/2010   SCIATICA 05/18/2010   POLYP, COLON 08/03/2007   Essential hypertension 08/03/2007   Diverticulosis of colon 08/03/2007    PCP: Adra Alanis FNP   REFERRING PROVIDER: Arnie Lao, MD  REFERRING DIAG:  Diagnosis  [phone] (ICD-10-CM) - Status post total left knee replacement  M17.12 (ICD-10-CM) - Unilateral primary osteoarthritis, left knee    THERAPY DIAG:  Acute pain of left knee  Stiffness of left knee, not elsewhere classified  Localized edema  Difficulty in walking, not elsewhere classified  Unsteadiness on feet  Rationale for Evaluation and Treatment: Rehabilitation  ONSET DATE: surgery 08/10/23  SUBJECTIVE:   SUBJECTIVE STATEMENT:   Nothing new, feeling good today. My total knee has been doing better than the other one, planning to get my other knee done next year    PERTINENT HISTORY: Lt TKA 08/10/23, OA, prostate CA, LBP  PAIN:   NPRS scale: 0/10 today, in the past week 1/10 at most  Pain location: Lt knee  Pain description: sore  Aggravating factors: stairs, nighttime Relieving factors: nothing specific indicated   PRECAUTIONS: Fall  RED FLAGS: None   WEIGHT  BEARING RESTRICTIONS: No  FALLS:  Has patient fallen in last 6 months? No  LIVING ENVIRONMENT: Lives with: lives with their spouse Lives in: House/apartment Stairs: 2 steps from carport into house, also has concrete ramp about 27ft (pt reports it is too steep) Has following equipment at home: Single point cane, Walker - 2 wheeled, Environmental consultant - 4 wheeled, and Ramped entry  OCCUPATION: retired   PLOF: Independent, Independent with basic ADLs, Independent with gait, and Independent with transfers  PATIENT GOALS: get back to PLOF- walking for exercise, be able to do floor transfers, build stamina, navigate steps and ramp safely   OBJECTIVE:  Note: Objective measures were completed at Evaluation unless otherwise noted.  DIAGNOSTIC FINDINGS:    IMPRESSION: 1. Two tiny foci of radiotracer activity in the pelvis are concerning for prostate cancer metastasis. 2. No evidence of local prostate cancer recurrence in the prostate bed. 3. No evidence of visceral metastasis or skeletal metastasis.  PATIENT SURVEYS:    Patient-Specific Activity Scoring Scheme  "0" represents "unable to perform." "10" represents "able to perform at prior level. 0 1 2 3 4 5 6 7 8 9  10 (Date and Score)     Activity Eval  09/25/2023   1. Walking 1.5 miles for exercise   0  7  2. Getting up and down from the floor  0   7  3. Stamina  3  5  4.Steps and ramp navigation 3 7   5.      Score 1.5  6.5 avg    Total score = sum of the activity scores/number of activities Minimum detectable change (90%CI) for average score = 2 points Minimum detectable change (90%CI) for single activity score = 3 points   LOWER EXTREMITY ROM:  Active ROM Left eval Left 08/30/23 Left  09/07/23 Left 09/12/2023 Left 09/19/23 Left 09/28/23 seated edge of mat table   Hip flexion        Hip extension        Hip abduction        Hip adduction        Hip internal rotation        Hip external rotation        Knee flexion 101* supine heel  slide  Supine heel slide 105* Supine heel slide 108* 105 supine Supine A:110 AROM 108*   Knee extension 7* supine quad set with heel prop   6* supine quad set with heel prop  Lacking 3 degrees (-3*) A: -3 deg AROM -4* LAQ   Ankle dorsiflexion        Ankle plantarflexion        Ankle inversion        Ankle eversion         (Blank rows = not tested)  LOWER EXTREMITY MMT:  MMT Left eval Rt 09/19/23 Left 09/19/23 Sitting HHD Left 09/28/23  Hip flexion 3+   5  Hip extension      Hip abduction      Hip adduction      Hip internal rotation      Hip external rotation      Knee flexion  45.2, 50.1 32.4, 36.0 5  Knee extension 4 58.9, 63.5 47.4, 48.2 5  Ankle dorsiflexion 5     Ankle plantarflexion      Ankle inversion      Ankle eversion       (Blank rows = not tested)  FUNCTIONAL TESTS:  09/28/23- floor to stand Mod(I)  09/25/2023: TUG Independent:  13.42 seconds 5x sit to stand: 20.59 seconds, no UE    08/24/2023 Evaluation:  5 times sit to stand: 47 seconds no UEs  Timed up and go (TUG): 33 seconds 4WW   GAIT: 10/01/2023: Ambulation in clinic independently   08/24/2023 Evaluation:  Distance walked: in clinic distances  Assistive device utilized: Environmental consultant - 4 wheeled Level of assistance: Modified independence Comments: slow gait pattern, intermittent heel toe, very slow pace of pain, some toe out B                    TODAY'S TREATMENT                                                                          DATE:    10/05/23  PreCor bike seat 5x10 minutes full rotations Forward step ups 6 inch step x12 L/too painful for R (added to HEP)  Tandem stance in corner 2x30 seconds B (added to HEP) Tandem walks at counter x 4 laps forward/backward (added to HEP) Sidesteps on blue foam pad x4 laps  Tandem stance on foam 2x30 seconds B  Narrow BOS blue foam x20 horizontal head turns, x20 vertical head turns  Lateral rocker board x2 minutes no UEs  AP rocker board x2 minutes no UEs    Education on HEP updates, tendency for most people to eventually get both knees replaced in the big picture, OK to rotate though different exercises from HEP rather than doing every single one every day, reviewed DC plan     10/01/2023 Therex: Recumbent bike lvl 5 10 mins ROM seat 6 Inciline gastroc stretch 30 sec x 3 Tailgate flexion swinging in chair 1 min   TherActivity (to improve stairs, transfers, squatting) Stair navigation flight with Rt hand assist lightly reciprocal gait pattern up/down x 1  Leg press double leg 100 lbs x 15, Lt only 2 x 15 56 lbs   Neuro Re-ed SLS on black mat with contralateral leg tapping each corner x 8 bilaterally with occasional HHA  SLS with contralateral leg step over and back 6 inch hurdle x 10 bilateral Lateral step over 6 inch hurdle x 10 each way   TODAY'S TREATMENT  DATE: 09/28/23 Precor bike seat 5 x10 minutes full rotations Forward step ups 8 inch box x10 B Forward lunges 8 inch box x10 B MMT, ROM, floor to stand for progress note  Education on POC, progress with PT- he is really doing well with therapy, anticipate that barring any random setbacks that he will be ready for DC to advanced HEP at end of currently scheduled sessions, demonstrated 2 exercises he can do at home with new ankle weights and made sure these were on HEP    In // bars:  Rocker board x2 minutes lateral Rocker board x2 minutes AP Tandem stance blue foam pad 3x30 seconds B Side steps blue foam pad x4 laps    09/25/2023 Therex: Recumbent bike lvl 6 8 mins ROM seat 5  Inciline gastroc stretch 30 sec x 3  Seated quad set with SLR x 15 bilateral with slow movement focus.   TherActivity (to improve stairs, transfers, squatting) Step on over and down 6 inch step no UE assist WB on Lt leg x 15  Leg press Lt only 2 x 15 56 lbs  5 x sit to stand from 18 inch chair s UE assist.   Neuro Re-ed Lt  SLS blaze pod 3 lights anterior semi circle with tapping with Rt leg 20 sec x 4  Tandem stance on foam in // bars 1 min x 1 bilateral with occasional HHA on bar for corrections.  Tandem ambulation on foam in // bars 6 ft x 5 each way with occasional HHA on bar   Vasopneumatic device  Deferred today    PATIENT EDUCATION:  eval Education details: see above  Person educated: Patient and Spouse Education method: Explanation and Verbal cues Education comprehension: verbalized understanding, returned demonstration, and needs further education  HOME EXERCISE PROGRAM:   Access Code: [email or web address] URL: [email or web address] Date: 10/05/2023 Prepared by: Terrel Ferries  Exercises - Ankle Alphabet in Elevation  - 2-3 x daily - 7 x weekly - 1 sets - 1-3 reps - 15 minutes hold - Seated Knee Flexion Extension AROM   - 1-3 x daily - 7 x weekly - 1-3 sets - 10 reps - 5 seconds hold - Supine Heel Slide with Strap  - 1-3 x daily - 7 x weekly - 1-3 sets - 10 reps - 5 seconds hold - Gastroc Stretch on Step  - 1-3 x daily - 7 x weekly - 1 sets - 3 reps - 30 seconds hold - Seated Hamstring Stretch with Strap  - 1-3 x daily - 7 x weekly - 1 sets - 3 reps - 30 seconds hold - Supine Quadriceps Stretch with Strap on Table  - 1-3 x daily - 7 x weekly - 1 sets - 3 reps - 30 seconds hold - Small Range Straight Leg Raise  - 3 x daily - 7 x weekly - 2-3 sets - 5 reps - 3 seconds hold - Supine Hamstring Stretch with Strap  - 1 x daily - 7 x weekly - 1 sets - 4-5 reps - 20 seconds hold - Seated Long Arc Quad with Ankle Weight  - 1 x daily - 7 x weekly - 2 sets - 10 reps - 2 seconds  hold - Prone Hamstring Curl with Ankle Weight  - 1 x daily - 7 x weekly - 2 sets - 10 reps - 2 seconds  hold - Tandem Stance in Corner  - 1 x daily - 7 x weekly - 1 sets - 6 reps -  30 seconds  hold - Tandem Walking with Counter Support  - 1 x daily - 7 x weekly - 1 sets - 3-4 reps - Backward Tandem Walking with Counter Support   - 1 x daily - 7 x weekly - 1 sets - 3-4 reps - Forward Step Up  - 1 x daily - 5 x weekly - 1 sets - 15 reps    ASSESSMENT:  CLINICAL IMPRESSION:   Doing well, still feeling good about his knee and DC plan. Continued working on a healthy mix of ROM, strength, and balance with HEP updates today as appropriate. Will finish out his last 2 scheduled sessions and then DC to advanced home program.   OBJECTIVE IMPAIRMENTS: Abnormal gait, decreased activity tolerance, decreased balance, decreased cognition, decreased knowledge of use of DME, decreased mobility, difficulty walking, decreased ROM, decreased strength, increased edema, increased fascial restrictions, impaired flexibility, obesity, and pain.   ACTIVITY LIMITATIONS: standing, squatting, stairs, transfers, bed mobility, and locomotion level  PARTICIPATION LIMITATIONS: driving, shopping, community activity, occupation, and yard work  PERSONAL FACTORS: Age, Education, Fitness, Past/current experiences, Profession, and Time since onset of injury/illness/exacerbation are also affecting patient's functional outcome.   REHAB POTENTIAL: Good  CLINICAL DECISION MAKING: Evolving/moderate complexity  EVALUATION COMPLEXITY: Moderate   GOALS: Goals reviewed with patient? No  SHORT TERM GOALS: Target date: 09/21/2023    Will be compliant with appropriate progressive HEP Goal status: Met 09/12/2023  2. L knee AROM flexion to be at least 115* and extension AROM to be no more than 3*  Goal status: Met 09/12/2023  3. Will be independent with edema management strategies  Goal status: MET 09/19/23   4. Gait pattern to have normalized without LRAD  Goal status: MET 09/19/23  LONG TERM GOALS: Target date: 10/19/2023    MMT to have improved by one grade all weak groups Goal status: MET 09/28/23   2. Will be able to perform floor to stand transfers without difficulty with no more than S  Goal status: MET 09/28/23   3. Will be able to ascend  and descend stairs and ramp reciprocally with no increase in pain Goal status: Ongoing   09/28/23 depends on the day/knee stiffness and pain at the time    4. Will be able to ambulate community distances and perform all household tasks with no increase in pain  Goal status: Ongoing   09/28/23 hasn't tried a lot yet    5. PSFS to have improved by at least 3 points to show improved QOL and subjective improvement  Goal status: MET 09/28/23  6. Will be able to walk for exercise as desired without increase from resting pain levels Goal status: Ongoing   09/28/23 encouraged this today, hasn't tried yet     PLAN:  PT FREQUENCY: 2x/week  PT DURATION: 8 weeks  PLANNED INTERVENTIONS: 97164- PT Re-evaluation, 97110-Therapeutic exercises, 97530- Therapeutic activity, 97112- Neuromuscular re-education, 97535- Self Care, 82956- Manual therapy, [phone]- Gait training, [phone]- Electrical stimulation (unattended), [phone]- Ionotophoresis 4mg /ml Dexamethasone , Stair training, Taping, Dry Needling, Joint mobilization, and DME instructions  PLAN FOR NEXT SESSION:  work towards transition to advanced HEP, DC on 10/12/23, careful with painful non-surgical knee   Terrel Ferries, PT, DPT 10/05/23 10:11 AM

## 2023-10-09 ENCOUNTER — Encounter: Payer: Self-pay | Admitting: Physical Therapy

## 2023-10-09 ENCOUNTER — Ambulatory Visit (INDEPENDENT_AMBULATORY_CARE_PROVIDER_SITE_OTHER): Admitting: Physical Therapy

## 2023-10-09 DIAGNOSIS — M25662 Stiffness of left knee, not elsewhere classified: Secondary | ICD-10-CM | POA: Diagnosis not present

## 2023-10-09 DIAGNOSIS — R262 Difficulty in walking, not elsewhere classified: Secondary | ICD-10-CM

## 2023-10-09 DIAGNOSIS — R2681 Unsteadiness on feet: Secondary | ICD-10-CM

## 2023-10-09 DIAGNOSIS — R600 Localized edema: Secondary | ICD-10-CM | POA: Diagnosis not present

## 2023-10-09 DIAGNOSIS — M25562 Pain in left knee: Secondary | ICD-10-CM

## 2023-10-09 NOTE — Therapy (Signed)
 OUTPATIENT PHYSICAL THERAPY  TREATMENT  Patient Name: Dedric Ethington MRN: [id number] DOB:03-22-1951, 73 y.o., male Today's Date: 10/09/2023  END OF SESSION:  PT End of Session - 10/09/23 0919     Visit Number 13    Number of Visits 14    Authorization Type MCR    Authorization Time Period 08/24/23 to 10/19/23    Progress Note Due on Visit 20    PT Start Time 0920    PT Stop Time 1000    PT Time Calculation (min) 40 min    Activity Tolerance Patient tolerated treatment well    Behavior During Therapy [facility] for tasks assessed/performed                     Past Medical History:  Diagnosis Date   Allergic rhinitis due to pollen    Allergy    Arthritis    Constipation 2018   Diverticulosis of colon (without mention of hemorrhage)    ED (erectile dysfunction)    Elevated PSA    GERD (gastroesophageal reflux disease)    History of colon polyps    2008--  PRE CANEROUS/   2013-- ADENOMA   Low back pain 2018   Muscle spasm 2018   Muscle weakness 2018   Post-void dribbling 2018   Prostate cancer (HCC) 11/01/2016   prostate cancer   [name]    Stress incontinence 2018   Unspecified essential hypertension    Urinary retention 2018   Wears glasses    Past Surgical History:  Procedure Laterality Date   COLONOSCOPY     COLONOSCOPY WITH PROPOFOL   10-30-2011  &  2008   POLYPECTOMY   HYDROCELE EXCISION Right 07/18/2013   Procedure: RIGHT HYDROCELECTOMY ADULT;  Surgeon: Andrez Banker, MD;  Location: [facility];  Service: Urology;  Laterality: Right;   LYMPH NODE DISSECTION Bilateral 11/01/2016   Procedure: BILATERAL PELVIC LYMPH NODE DISSECTION;  Surgeon: Andrez Banker, MD;  Location: [facility];  Service: Urology;  Laterality: Bilateral;   POLYPECTOMY     PROSTATE BIOPSY     ROBOT ASSISTED LAPAROSCOPIC RADICAL PROSTATECTOMY N/A 11/01/2016   Procedure: XI ROBOTIC ASSISTED LAPAROSCOPIC RADICAL PROSTATECTOMY;  Surgeon: Andrez Banker, MD;  Location: [facility];  Service: Urology;  Laterality: N/A;   TOTAL KNEE ARTHROPLASTY Left 08/10/2023   Procedure: ARTHROPLASTY, KNEE, TOTAL;  Surgeon: Arnie Lao, MD;  Location: [facility];  Service: Orthopedics;  Laterality: Left;  LEFT TOTAL KNEE ARTHROPLASTY  SPINAL + BLOCK   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   Medicare annual wellness visit, initial 10/18/2015   Hydrocele, right 06/24/2013   Routine general medical examination at a health care facility 12/04/2010   Morbid obesity (HCC) 05/18/2010   SCIATICA 05/18/2010   POLYP, COLON 08/03/2007   Essential hypertension 08/03/2007   Diverticulosis of colon 08/03/2007    PCP: Adra Alanis FNP   REFERRING PROVIDER: Arnie Lao, MD  REFERRING DIAG:  Diagnosis  [id number] (ICD-10-CM) - Status post total left knee replacement  M17.12 (ICD-10-CM) - Unilateral primary osteoarthritis, left knee    THERAPY DIAG:  Acute pain of left knee  Stiffness of left knee, not elsewhere classified  Localized edema  Difficulty in walking, not elsewhere  classified  Unsteadiness on feet  Rationale for Evaluation and Treatment: Rehabilitation  ONSET DATE: surgery 08/10/23  SUBJECTIVE:   SUBJECTIVE STATEMENT: No new complaints; doing well overall.   PERTINENT HISTORY: Lt TKA 08/10/23, OA, prostate CA, LBP  PAIN:   NPRS scale: 0/10 today, in the past week 1/10 at most  Pain location: Lt knee  Pain description: sore  Aggravating factors: stairs, nighttime Relieving factors: nothing specific indicated   PRECAUTIONS: Fall  RED FLAGS: None   WEIGHT BEARING RESTRICTIONS: No  FALLS:  Has patient fallen in last 6 months? No  LIVING  ENVIRONMENT: Lives with: lives with their spouse Lives in: House/apartment Stairs: 2 steps from carport into house, also has concrete ramp about 15ft (pt reports it is too steep) Has following equipment at home: Single point cane, Walker - 2 wheeled, Environmental consultant - 4 wheeled, and Ramped entry  OCCUPATION: retired   PLOF: Independent, Independent with basic ADLs, Independent with gait, and Independent with transfers  PATIENT GOALS: get back to PLOF- walking for exercise, be able to do floor transfers, build stamina, navigate steps and ramp safely   OBJECTIVE:  Note: Objective measures were completed at Evaluation unless otherwise noted.  DIAGNOSTIC FINDINGS:    IMPRESSION: 1. Two tiny foci of radiotracer activity in the pelvis are concerning for prostate cancer metastasis. 2. No evidence of local prostate cancer recurrence in the prostate bed. 3. No evidence of visceral metastasis or skeletal metastasis.  PATIENT SURVEYS:    Patient-Specific Activity Scoring Scheme  "0" represents "unable to perform." "10" represents "able to perform at prior level. 0 1 2 3 4 5 6 7 8 9  10 (Date and Score)     Activity Eval  09/25/2023   1. Walking 1.5 miles for exercise   0  7  2. Getting up and down from the floor  0   7  3. Stamina  3  5  4.Steps and ramp navigation 3 7   5.      Score 1.5  6.5 avg    Total score = sum of the activity scores/number of activities Minimum detectable change (90%CI) for average score = 2 points Minimum detectable change (90%CI) for single activity score = 3 points   LOWER EXTREMITY ROM:  Active ROM Left eval Left 08/30/23 Left  09/07/23 Left 09/12/2023 Left 09/19/23 Left 09/28/23 seated edge of mat table   Knee flexion 101* supine heel slide  Supine heel slide 105* Supine heel slide 108* 105 supine Supine A:110 AROM 108*   Knee extension 7* supine quad set with heel prop   6* supine quad set with heel prop  Lacking 3 degrees (-3*) A: -3 deg AROM -4* LAQ     (Blank rows = not tested)  LOWER EXTREMITY MMT:  MMT Left eval Rt 09/19/23 Left 09/19/23 Sitting HHD Left  09/28/23  Hip flexion 3+   5  Knee flexion  45.2, 50.1 32.4, 36.0 5  Knee extension 4 58.9, 63.5 47.4, 48.2 5  Ankle dorsiflexion 5      (Blank rows = not tested)  FUNCTIONAL TESTS:  09/28/23- floor to stand Mod(I)  09/25/2023: TUG Independent:  13.42 seconds 5x sit to stand: 20.59 seconds, no UE    08/24/2023 Evaluation:  5 times sit to stand: 47 seconds no UEs  Timed up and go (TUG): 33 seconds 4WW   GAIT: 10/01/2023: Ambulation in clinic independently   08/24/2023 Evaluation:  Distance walked: in clinic distances  Assistive device utilized: Environmental consultant -  4 wheeled Level of assistance: Modified independence Comments: slow gait pattern, intermittent heel toe, very slow pace of pain, some toe out B                    TODAY'S TREATMENT 10/09/23 TherEx Recumbent bike L3 x 10 min; seat 5 Knee extension on BATCA 10# 2x10 LLE only Hamstring curl on BATCA 15# LLE only   Neuro Re Ed Tandem stand 2x30 sec bil Tandem walking forward/backward x 5 laps in // bars; intermittent UE support needed  TherAct Standing hip abduction 2x10 bil; L4 band Standing hip extension 2x10 bil; L4 band Calf raises 2x10 Leg press bil 100# 2x15; then LLE only 56# 2x10    10/05/23 PreCor bike seat 5x10 minutes full rotations Forward step ups 6 inch step x12 L/too painful for R (added to HEP)  Tandem stance in corner 2x30 seconds B (added to HEP) Tandem walks at counter x 4 laps forward/backward (added to HEP) Sidesteps on blue foam pad x4 laps  Tandem stance on foam 2x30 seconds B  Narrow BOS blue foam x20 horizontal head turns, x20 vertical head turns  Lateral rocker board x2 minutes no UEs  AP rocker board x2 minutes no UEs   Education on HEP updates, tendency for most people to eventually get both knees replaced in the big picture, OK to rotate though different exercises from HEP rather  than doing every single one every day, reviewed DC plan     10/01/2023 Therex: Recumbent bike lvl 5 10 mins ROM seat 6 Inciline gastroc stretch 30 sec x 3 Tailgate flexion swinging in chair 1 min   TherActivity (to improve stairs, transfers, squatting) Stair navigation flight with Rt hand assist lightly reciprocal gait pattern up/down x 1  Leg press double leg 100 lbs x 15, Lt only 2 x 15 56 lbs   Neuro Re-ed SLS on black mat with contralateral leg tapping each corner x 8 bilaterally with occasional HHA  SLS with contralateral leg step over and back 6 inch hurdle x 10 bilateral Lateral step over 6 inch hurdle x 10 each way   09/28/23 Precor bike seat 5 x10 minutes full rotations Forward step ups 8 inch box x10 B Forward lunges 8 inch box x10 B MMT, ROM, floor to stand for progress note  Education on POC, progress with PT- he is really doing well with therapy, anticipate that barring any random setbacks that he will be ready for DC to advanced HEP at end of currently scheduled sessions, demonstrated 2 exercises he can do at home with new ankle weights and made sure these were on HEP    In // bars:  Rocker board x2 minutes lateral Rocker board x2 minutes AP Tandem stance blue foam pad 3x30 seconds B Side steps blue foam pad x4 laps    PATIENT EDUCATION:  eval Education details: see above  Person educated: Patient and Spouse Education method: Explanation and Verbal cues Education comprehension: verbalized understanding, returned demonstration, and needs further education  HOME EXERCISE PROGRAM: Access Code: [email or web address] URL: [email or web address] Date: 10/09/2023 Prepared by: Casimer Clear  Exercises - Ankle Alphabet in Elevation  - 2-3 x daily - 7 x weekly - 1 sets - 1-3 reps - 15 minutes hold - Seated Knee Flexion Extension AROM   - 1-3 x daily - 7 x weekly - 1-3 sets - 10 reps - 5 seconds hold - Supine Heel Slide with Strap  - 1-3 x daily - 7 x weekly  -  1-3 sets - 10 reps - 5 seconds hold - Gastroc Stretch on Step  - 1-3 x daily - 7 x weekly - 1 sets - 3 reps - 30 seconds hold - Seated Hamstring Stretch with Strap  - 1-3 x daily - 7 x weekly - 1 sets - 3 reps - 30 seconds hold - Supine Quadriceps Stretch with Strap on Table  - 1-3 x daily - 7 x weekly - 1 sets - 3 reps - 30 seconds hold - Small Range Straight Leg Raise  - 3 x daily - 7 x weekly - 2-3 sets - 5 reps - 3 seconds hold - Supine Hamstring Stretch with Strap  - 1 x daily - 7 x weekly - 1 sets - 4-5 reps - 20 seconds hold - Seated Long Arc Quad with Ankle Weight  - 1 x daily - 7 x weekly - 2 sets - 10 reps - 2 seconds  hold - Prone Hamstring Curl with Ankle Weight  - 1 x daily - 7 x weekly - 2 sets - 10 reps - 2 seconds  hold - Tandem Stance in Corner  - 1 x daily - 7 x weekly - 1 sets - 6 reps - 30 seconds  hold - Tandem Walking with Counter Support  - 1 x daily - 7 x weekly - 1 sets - 3-4 reps - Backward Tandem Walking with Counter Support  - 1 x daily - 7 x weekly - 1 sets - 3-4 reps - Forward Step Up  - 1 x daily - 5 x weekly - 1 sets - 15 reps - Standing Hip Abduction with Resistance at Ankles and Counter Support  - 1 x daily - 3-4 x weekly - 2 sets - 10 reps - Standing Hip Extension with Resistance at Ankles and Counter Support  - 1 x daily - 3-4 x weekly - 2 sets - 10 reps - Heel Raises with Counter Support  - 1 x daily - 3-4 x weekly - 1 sets - 20 reps    ASSESSMENT:  CLINICAL IMPRESSION: Pt tolerated session well today and added a few strengthening exercises for him to continue at home.  Anticipate d/c next visit.    OBJECTIVE IMPAIRMENTS: Abnormal gait, decreased activity tolerance, decreased balance, decreased cognition, decreased knowledge of use of DME, decreased mobility, difficulty walking, decreased ROM, decreased strength, increased edema, increased fascial restrictions, impaired flexibility, obesity, and pain.   ACTIVITY LIMITATIONS: standing, squatting,  stairs, transfers, bed mobility, and locomotion level  PARTICIPATION LIMITATIONS: driving, shopping, community activity, occupation, and yard work  PERSONAL FACTORS: Age, Education, Fitness, Past/current experiences, Profession, and Time since onset of injury/illness/exacerbation are also affecting patient's functional outcome.   REHAB POTENTIAL: Good  CLINICAL DECISION MAKING: Evolving/moderate complexity  EVALUATION COMPLEXITY: Moderate   GOALS: Goals reviewed with patient? No  SHORT TERM GOALS: Target date: 09/21/2023    Will be compliant with appropriate progressive HEP Goal status: Met 09/12/2023  2. L knee AROM flexion to be at least 115* and extension AROM to be no more than 3*  Goal status: Met 09/12/2023  3. Will be independent with edema management strategies  Goal status: MET 09/19/23   4. Gait pattern to have normalized without LRAD  Goal status: MET 09/19/23  LONG TERM GOALS: Target date: 10/19/2023    MMT to have improved by one grade all weak groups Goal status: MET 09/28/23   2. Will be able to perform floor to stand transfers without difficulty with  no more than S  Goal status: MET 09/28/23   3. Will be able to ascend and descend stairs and ramp reciprocally with no increase in pain Goal status: Ongoing   09/28/23 depends on the day/knee stiffness and pain at the time    4. Will be able to ambulate community distances and perform all household tasks with no increase in pain  Goal status: Ongoing   09/28/23 hasn't tried a lot yet    5. PSFS to have improved by at least 3 points to show improved QOL and subjective improvement  Goal status: MET 09/28/23  6. Will be able to walk for exercise as desired without increase from resting pain levels Goal status: Ongoing   09/28/23 encouraged this today, hasn't tried yet     PLAN:  PT FREQUENCY: 2x/week  PT DURATION: 8 weeks  PLANNED INTERVENTIONS: 97164- PT Re-evaluation, 97110-Therapeutic exercises, 97530-  Therapeutic activity, 97112- Neuromuscular re-education, 97535- Self Care, 16109- Manual therapy, [phone]- Gait training, [phone]- Electrical stimulation (unattended), [phone]- Ionotophoresis 4mg /ml Dexamethasone , Stair training, Taping, Dry Needling, Joint mobilization, and DME instructions  PLAN FOR NEXT SESSION: d/c PT next visit    Marley Simmers, PT, DPT 10/09/23 11:24 AM

## 2023-10-12 ENCOUNTER — Telehealth: Payer: Self-pay

## 2023-10-12 ENCOUNTER — Encounter: Payer: Self-pay | Admitting: Physical Therapy

## 2023-10-12 ENCOUNTER — Ambulatory Visit (INDEPENDENT_AMBULATORY_CARE_PROVIDER_SITE_OTHER): Admitting: Physical Therapy

## 2023-10-12 DIAGNOSIS — M25562 Pain in left knee: Secondary | ICD-10-CM | POA: Diagnosis not present

## 2023-10-12 DIAGNOSIS — R262 Difficulty in walking, not elsewhere classified: Secondary | ICD-10-CM | POA: Diagnosis not present

## 2023-10-12 DIAGNOSIS — M25662 Stiffness of left knee, not elsewhere classified: Secondary | ICD-10-CM | POA: Diagnosis not present

## 2023-10-12 DIAGNOSIS — R600 Localized edema: Secondary | ICD-10-CM | POA: Diagnosis not present

## 2023-10-12 DIAGNOSIS — R2681 Unsteadiness on feet: Secondary | ICD-10-CM

## 2023-10-12 NOTE — Therapy (Signed)
 OUTPATIENT PHYSICAL THERAPY  DISCHARGE   Patient Name: Traycen Goyer MRN: [id number] DOB:07/07/1950, 73 y.o., male Today's Date: 10/12/2023    PHYSICAL THERAPY DISCHARGE SUMMARY  Visits from Start of Care: 14  Current functional level related to goals / functional outcomes: See below    Remaining deficits: See below    Education / Equipment: See below    Patient agrees to discharge. Patient goals were met. Patient is being discharged due to meeting the stated rehab goals.   END OF SESSION:  PT End of Session - 10/12/23 0937     Visit Number 14    Number of Visits 14    Authorization Type MCR    Authorization Time Period 08/24/23 to 10/19/23    Progress Note Due on Visit 20    PT Start Time 0933    PT Stop Time 0956   DC today, no further skilled PT interventions needed   PT Time Calculation (min) 23 min    Activity Tolerance Patient tolerated treatment well    Behavior During Therapy [facility] for tasks assessed/performed                      Past Medical History:  Diagnosis Date   Allergic rhinitis due to pollen    Allergy    Arthritis    Constipation 2018   Diverticulosis of colon (without mention of hemorrhage)    ED (erectile dysfunction)    Elevated PSA    GERD (gastroesophageal reflux disease)    History of colon polyps    2008--  PRE CANEROUS/   2013-- ADENOMA   Low back pain 2018   Muscle spasm 2018   Muscle weakness 2018   Post-void dribbling 2018   Prostate cancer (HCC) 11/01/2016   prostate cancer   [name]    Stress incontinence 2018   Unspecified essential hypertension    Urinary retention 2018   Wears glasses    Past Surgical History:  Procedure Laterality Date   COLONOSCOPY     COLONOSCOPY WITH PROPOFOL   10-30-2011  &  2008   POLYPECTOMY   HYDROCELE EXCISION Right 07/18/2013   Procedure: RIGHT HYDROCELECTOMY ADULT;  Surgeon: Andrez Banker, MD;  Location: [facility];  Service: Urology;   Laterality: Right;   LYMPH NODE DISSECTION Bilateral 11/01/2016   Procedure: BILATERAL PELVIC LYMPH NODE DISSECTION;  Surgeon: Andrez Banker, MD;  Location: [facility];  Service: Urology;  Laterality: Bilateral;   POLYPECTOMY     PROSTATE BIOPSY     ROBOT ASSISTED LAPAROSCOPIC RADICAL PROSTATECTOMY N/A 11/01/2016   Procedure: XI ROBOTIC ASSISTED LAPAROSCOPIC RADICAL PROSTATECTOMY;  Surgeon: Andrez Banker, MD;  Location: [facility];  Service: Urology;  Laterality: N/A;   TOTAL KNEE ARTHROPLASTY Left 08/10/2023   Procedure: ARTHROPLASTY, KNEE, TOTAL;  Surgeon: Arnie Lao, MD;  Location: [facility];  Service: Orthopedics;  Laterality: Left;  LEFT TOTAL KNEE ARTHROPLASTY  SPINAL + BLOCK   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   Medicare annual wellness visit, initial 10/18/2015   Hydrocele, right 06/24/2013   Routine general medical examination at a health care facility 12/04/2010   Morbid obesity (HCC) 05/18/2010   SCIATICA 05/18/2010   POLYP, COLON 08/03/2007   Essential hypertension  08/03/2007   Diverticulosis of colon 08/03/2007    PCP: Adra Alanis FNP   REFERRING PROVIDER: Arnie Lao, MD  REFERRING DIAG:  Diagnosis  [phone] (ICD-10-CM) - Status post total left knee replacement  M17.12 (ICD-10-CM) - Unilateral primary osteoarthritis, left knee    THERAPY DIAG:  Acute pain of left knee  Stiffness of left knee, not elsewhere classified  Difficulty in walking, not elsewhere classified  Unsteadiness on feet  Localized edema  Rationale for Evaluation and Treatment: Rehabilitation  ONSET DATE: surgery 08/10/23  SUBJECTIVE:   SUBJECTIVE STATEMENT:   Nothing new, at this point  non-surgical knee is hurting more than my TKR   PERTINENT HISTORY: Lt TKA 08/10/23, OA, prostate CA, LBP  PAIN:   NPRS scale: 0/10    PRECAUTIONS: Fall  RED FLAGS: None   WEIGHT BEARING RESTRICTIONS: No  FALLS:  Has patient fallen in last 6 months? No  LIVING ENVIRONMENT: Lives with: lives with their spouse Lives in: House/apartment Stairs: 2 steps from carport into house, also has concrete ramp about 108ft (pt reports it is too steep) Has following equipment at home: Single point cane, Walker - 2 wheeled, Environmental consultant - 4 wheeled, and Ramped entry  OCCUPATION: retired   PLOF: Independent, Independent with basic ADLs, Independent with gait, and Independent with transfers  PATIENT GOALS: get back to PLOF- walking for exercise, be able to do floor transfers, build stamina, navigate steps and ramp safely   OBJECTIVE:  Note: Objective measures were completed at Evaluation unless otherwise noted.  DIAGNOSTIC FINDINGS:    IMPRESSION: 1. Two tiny foci of radiotracer activity in the pelvis are concerning for prostate cancer metastasis. 2. No evidence of local prostate cancer recurrence in the prostate bed. 3. No evidence of visceral metastasis or skeletal metastasis.  PATIENT SURVEYS:    Patient-Specific Activity Scoring Scheme  "0" represents "unable to perform." "10" represents "able to perform at prior level. 0 1 2 3 4 5 6 7 8 9  10 (Date and Score)     Activity Eval  09/25/2023  10/12/23 (discharge)  1. Walking 1.5 miles for exercise   0  7 9  2. Getting up and down from the floor  0   7 8  3. Stamina  3  5 8   4.Steps and ramp navigation 3 7  9   5.       Score 1.5  6.5 avg 8.5    Total score = sum of the activity scores/number of activities Minimum detectable change (90%CI) for average score = 2 points Minimum detectable change (90%CI) for single activity score = 3 points   LOWER EXTREMITY ROM:  Active ROM Left eval Left 08/30/23 Left  09/07/23 Left 09/12/2023  Left 09/19/23 Left 09/28/23 seated edge of mat table  Left 10/12/23 seated   Knee flexion 101* supine heel slide  Supine heel slide 105* Supine heel slide 108* 105 supine Supine A:110 AROM 108*  AROM 109*  Knee extension 7* supine quad set with heel prop   6* supine quad set with heel prop  Lacking 3 degrees (-3*) A: -3 deg AROM -4* LAQ  AROM -6* LAQ    (Blank rows = not tested)  LOWER EXTREMITY MMT:  MMT Left eval Rt 09/19/23 Left 09/19/23 Sitting HHD Left  09/28/23 Left 10/12/23  Hip flexion 3+   5 4+  Knee flexion  45.2, 50.1 32.4, 36.0 5 4+  Knee extension 4 58.9, 63.5 47.4, 48.2 5 5   Ankle dorsiflexion 5       (  Blank rows = not tested)  FUNCTIONAL TESTS:   10/12/23 TUG 11.2 seconds no device  5xSTS 19.8 seconds no UEs     09/28/23- floor to stand Mod(I)  09/25/2023: TUG Independent:  13.42 seconds 5x sit to stand: 20.59 seconds, no UE    08/24/2023 Evaluation:  5 times sit to stand: 47 seconds no UEs  Timed up and go (TUG): 33 seconds 4WW   GAIT: 10/01/2023: Ambulation in clinic independently   08/24/2023 Evaluation:  Distance walked: in clinic distances  Assistive device utilized: Environmental consultant - 4 wheeled Level of assistance: Modified independence Comments: slow gait pattern, intermittent heel toe, very slow pace of pain, some toe out B                    TODAY'S TREATMENT   10/12/23  Scifit bike seat 5 x10 minutes full rotations for ROM and w/u prior to objective testing   MMT/ROM/5xSTS/TUG/goal check education on DC  Pt declined further skilled interventions today- happy with functional status and no acute concerns/needs      10/09/23 TherEx Recumbent bike L3 x 10 min; seat 5 Knee extension on BATCA 10# 2x10 LLE only Hamstring curl on BATCA 15# LLE only   Neuro Re Ed Tandem stand 2x30 sec bil Tandem walking forward/backward x 5 laps in // bars; intermittent UE support needed  TherAct Standing hip abduction 2x10 bil; L4 band Standing hip extension 2x10  bil; L4 band Calf raises 2x10 Leg press bil 100# 2x15; then LLE only 56# 2x10    10/05/23 PreCor bike seat 5x10 minutes full rotations Forward step ups 6 inch step x12 L/too painful for R (added to HEP)  Tandem stance in corner 2x30 seconds B (added to HEP) Tandem walks at counter x 4 laps forward/backward (added to HEP) Sidesteps on blue foam pad x4 laps  Tandem stance on foam 2x30 seconds B  Narrow BOS blue foam x20 horizontal head turns, x20 vertical head turns  Lateral rocker board x2 minutes no UEs  AP rocker board x2 minutes no UEs   Education on HEP updates, tendency for most people to eventually get both knees replaced in the big picture, OK to rotate though different exercises from HEP rather than doing every single one every day, reviewed DC plan     10/01/2023 Therex: Recumbent bike lvl 5 10 mins ROM seat 6 Inciline gastroc stretch 30 sec x 3 Tailgate flexion swinging in chair 1 min   TherActivity (to improve stairs, transfers, squatting) Stair navigation flight with Rt hand assist lightly reciprocal gait pattern up/down x 1  Leg press double leg 100 lbs x 15, Lt only 2 x 15 56 lbs   Neuro Re-ed SLS on black mat with contralateral leg tapping each corner x 8 bilaterally with occasional HHA  SLS with contralateral leg step over and back 6 inch hurdle x 10 bilateral Lateral step over 6 inch hurdle x 10 each way   09/28/23 Precor bike seat 5 x10 minutes full rotations Forward step ups 8 inch box x10 B Forward lunges 8 inch box x10 B MMT, ROM, floor to stand for progress note  Education on POC, progress with PT- he is really doing well with therapy, anticipate that barring any random setbacks that he will be ready for DC to advanced HEP at end of currently scheduled sessions, demonstrated 2 exercises he can do at home with new ankle weights and made sure these were on HEP    In // bars:  Rocker board x2 minutes lateral Rocker board x2 minutes AP Tandem stance  blue foam pad 3x30 seconds B Side steps blue foam pad x4 laps    PATIENT EDUCATION:  eval Education details: see above  Person educated: Patient and Spouse Education method: Explanation and Verbal cues Education comprehension: verbalized understanding, returned demonstration, and needs further education  HOME EXERCISE PROGRAM: Access Code: [email or web address] URL: [email or web address] Date: 10/09/2023 Prepared by: Casimer Clear  Exercises - Ankle Alphabet in Elevation  - 2-3 x daily - 7 x weekly - 1 sets - 1-3 reps - 15 minutes hold - Seated Knee Flexion Extension AROM   - 1-3 x daily - 7 x weekly - 1-3 sets - 10 reps - 5 seconds hold - Supine Heel Slide with Strap  - 1-3 x daily - 7 x weekly - 1-3 sets - 10 reps - 5 seconds hold - Gastroc Stretch on Step  - 1-3 x daily - 7 x weekly - 1 sets - 3 reps - 30 seconds hold - Seated Hamstring Stretch with Strap  - 1-3 x daily - 7 x weekly - 1 sets - 3 reps - 30 seconds hold - Supine Quadriceps Stretch with Strap on Table  - 1-3 x daily - 7 x weekly - 1 sets - 3 reps - 30 seconds hold - Small Range Straight Leg Raise  - 3 x daily - 7 x weekly - 2-3 sets - 5 reps - 3 seconds hold - Supine Hamstring Stretch with Strap  - 1 x daily - 7 x weekly - 1 sets - 4-5 reps - 20 seconds hold - Seated Long Arc Quad with Ankle Weight  - 1 x daily - 7 x weekly - 2 sets - 10 reps - 2 seconds  hold - Prone Hamstring Curl with Ankle Weight  - 1 x daily - 7 x weekly - 2 sets - 10 reps - 2 seconds  hold - Tandem Stance in Corner  - 1 x daily - 7 x weekly - 1 sets - 6 reps - 30 seconds  hold - Tandem Walking with Counter Support  - 1 x daily - 7 x weekly - 1 sets - 3-4 reps - Backward Tandem Walking with Counter Support  - 1 x daily - 7 x weekly - 1 sets - 3-4 reps - Forward Step Up  - 1 x daily - 5 x weekly - 1 sets - 15 reps - Standing Hip Abduction with Resistance at Ankles and Counter Support  - 1 x daily - 3-4 x weekly - 2 sets - 10 reps - Standing  Hip Extension with Resistance at Ankles and Counter Support  - 1 x daily - 3-4 x weekly - 2 sets - 10 reps - Heel Raises with Counter Support  - 1 x daily - 3-4 x weekly - 1 sets - 20 reps    ASSESSMENT:  CLINICAL IMPRESSION:   Pt arrives feeling very well in terms of his surgical knee, unfortunately his non-op knee has been acting up quite a bit and has become fairly painful but he will be getting a gel shot soon. Updated measures for DC and answered all questions/addressed all concerns. He has made excellent progress with skilled PT services- DC today, thank you for the referral!      OBJECTIVE IMPAIRMENTS: Abnormal gait, decreased activity tolerance, decreased balance, decreased cognition, decreased knowledge of use of DME, decreased mobility, difficulty walking, decreased ROM, decreased strength, increased edema, increased fascial restrictions, impaired  flexibility, obesity, and pain.   ACTIVITY LIMITATIONS: standing, squatting, stairs, transfers, bed mobility, and locomotion level  PARTICIPATION LIMITATIONS: driving, shopping, community activity, occupation, and yard work  PERSONAL FACTORS: Age, Education, Fitness, Past/current experiences, Profession, and Time since onset of injury/illness/exacerbation are also affecting patient's functional outcome.   REHAB POTENTIAL: Good  CLINICAL DECISION MAKING: Evolving/moderate complexity  EVALUATION COMPLEXITY: Moderate   GOALS: Goals reviewed with patient? No  SHORT TERM GOALS: Target date: 09/21/2023    Will be compliant with appropriate progressive HEP Goal status: Met 09/12/2023  2. L knee AROM flexion to be at least 115* and extension AROM to be no more than 3*  Goal status: Met 09/12/2023  3. Will be independent with edema management strategies  Goal status: MET 09/19/23   4. Gait pattern to have normalized without LRAD  Goal status: MET 09/19/23  LONG TERM GOALS: Target date: 10/19/2023    MMT to have improved by one  grade all weak groups Goal status: MET 09/28/23   2. Will be able to perform floor to stand transfers without difficulty with no more than S  Goal status: MET 09/28/23   3. Will be able to ascend and descend stairs and ramp reciprocally with no increase in pain Goal status: MET 10/12/23 L knee is OK, R knee(non-surgical side) is limiting factor    4. Will be able to ambulate community distances and perform all household tasks with no increase in pain  Goal status:  MET 10/12/23   5. PSFS to have improved by at least 3 points to show improved QOL and subjective improvement  Goal status: MET 09/28/23  6. Will be able to walk for exercise as desired without increase from resting pain levels Goal status: NOT MET 10/12/23- has not tried yet     PLAN:  PT FREQUENCY: 2x/week  PT DURATION: 8 weeks  PLANNED INTERVENTIONS: 97164- PT Re-evaluation, 97110-Therapeutic exercises, 97530- Therapeutic activity, 97112- Neuromuscular re-education, 97535- Self Care, 16109- Manual therapy, [phone]- Gait training, [phone]- Electrical stimulation (unattended), [phone]- Ionotophoresis 4mg /ml Dexamethasone , Stair training, Taping, Dry Needling, Joint mobilization, and DME instructions  PLAN FOR NEXT SESSION: DC TODAY    Terrel Ferries, PT, DPT 10/12/23 9:58 AM

## 2023-10-12 NOTE — Telephone Encounter (Signed)
 Can you schedule patient when medication is stocked  Zilretta  authorized for bilateral knee Medicare Coinsurance 20% Deductible $257 has met $257  OOP does not apply Reference Website 10/03/23  Secondary AARP No pa required Covers 20% medicare coinsurance and part B deductible  Reference IVR 10/03/23

## 2023-10-15 NOTE — Telephone Encounter (Signed)
 Approved and documented in [place].

## 2023-10-17 NOTE — Progress Notes (Unsigned)
   Joanna Muck, PhD, LAT, ATC acting as a scribe for Garlan Juniper, MD.  Luis Knapp is a 73 y.o. male who presents to [facility] at [facility] today for exacerbation of his R knee pain. Pt was last seen by Dr. Alease Hunter on 09/10/23 and  completed the Gelsyn series, 3/3, bilaterally on 05/21/23.  Pt had a L TKR w/ Dr. Lucienne Ryder on 08/10/23 and struggled post-op w/ pain control, negative reaction to dilaudid .  Today, pt reports ***   Pertinent review of systems: ***  Relevant historical information: ***   Exam:  There were no vitals taken for this visit. General: Well Developed, well nourished, and in no acute distress.   MSK: ***    Lab and Radiology Results No results found for this or any previous visit (from the past 72 hours). No results found.     Assessment and Plan: 73 y.o. male with ***   PDMP not reviewed this encounter. No orders of the defined types were placed in this encounter.  No orders of the defined types were placed in this encounter.    Discussed warning signs or symptoms. Please see discharge instructions. Patient expresses understanding.   ***

## 2023-10-17 NOTE — Telephone Encounter (Signed)
 Scheduled. Patient would only like the right knee as his left has been replaced.

## 2023-10-18 ENCOUNTER — Ambulatory Visit (INDEPENDENT_AMBULATORY_CARE_PROVIDER_SITE_OTHER): Admitting: Family Medicine

## 2023-10-18 ENCOUNTER — Other Ambulatory Visit: Payer: Self-pay

## 2023-10-18 ENCOUNTER — Encounter: Payer: Self-pay | Admitting: Family Medicine

## 2023-10-18 VITALS — BP 118/72 | HR 90 | Ht 66.0 in | Wt 230.0 lb

## 2023-10-18 DIAGNOSIS — M25561 Pain in right knee: Secondary | ICD-10-CM

## 2023-10-18 DIAGNOSIS — G8929 Other chronic pain: Secondary | ICD-10-CM

## 2023-10-18 DIAGNOSIS — M1711 Unilateral primary osteoarthritis, right knee: Secondary | ICD-10-CM

## 2023-10-18 MED ORDER — TRIAMCINOLONE ACETONIDE 32 MG IX SRER
32.0000 mg | Freq: Once | INTRA_ARTICULAR | Status: AC
  Administered 2023-10-18: 32 mg via INTRA_ARTICULAR

## 2023-10-18 NOTE — Patient Instructions (Signed)
 Thank you for coming in today.   You received an injection today. Seek immediate medical attention if the joint becomes red, extremely painful, or is oozing fluid.

## 2023-10-22 DIAGNOSIS — Z8546 Personal history of malignant neoplasm of prostate: Secondary | ICD-10-CM | POA: Diagnosis not present

## 2023-10-29 DIAGNOSIS — N5231 Erectile dysfunction following radical prostatectomy: Secondary | ICD-10-CM | POA: Diagnosis not present

## 2023-10-29 DIAGNOSIS — N393 Stress incontinence (female) (male): Secondary | ICD-10-CM | POA: Diagnosis not present

## 2023-10-29 DIAGNOSIS — C61 Malignant neoplasm of prostate: Secondary | ICD-10-CM | POA: Diagnosis not present

## 2023-10-29 NOTE — Telephone Encounter (Signed)
 Patient was given Zilretta  at 10/18/23 appointment will run gelsyn again only when patient asks for it

## 2024-01-02 ENCOUNTER — Encounter: Payer: Self-pay | Admitting: Family Medicine

## 2024-01-02 NOTE — Telephone Encounter (Signed)
 Please re-auth Zilretta , RIGHT knee

## 2024-01-03 NOTE — Telephone Encounter (Signed)
 Ran bilateral zilretta , patient only needing right knee. Case number [id number]

## 2024-01-04 NOTE — Telephone Encounter (Signed)
 Zilretta  Authorized for right knee Medicare NO PA REQUIRED  Coinsurance 80% Deductible $257 has met $257 OOP does not apply Reference # website 01/03/24  AARP NO PA REQIRED Plan covers 20% of medicare coinsurance Covers Part B Deductible Reference # IVR 01/03/24

## 2024-01-04 NOTE — Telephone Encounter (Signed)
 Noted. Scheduled 9/15.

## 2024-01-15 DIAGNOSIS — L918 Other hypertrophic disorders of the skin: Secondary | ICD-10-CM | POA: Diagnosis not present

## 2024-01-15 DIAGNOSIS — Z85828 Personal history of other malignant neoplasm of skin: Secondary | ICD-10-CM | POA: Diagnosis not present

## 2024-01-15 DIAGNOSIS — L738 Other specified follicular disorders: Secondary | ICD-10-CM | POA: Diagnosis not present

## 2024-01-15 DIAGNOSIS — L308 Other specified dermatitis: Secondary | ICD-10-CM | POA: Diagnosis not present

## 2024-01-15 DIAGNOSIS — D2372 Other benign neoplasm of skin of left lower limb, including hip: Secondary | ICD-10-CM | POA: Diagnosis not present

## 2024-01-15 DIAGNOSIS — D485 Neoplasm of uncertain behavior of skin: Secondary | ICD-10-CM | POA: Diagnosis not present

## 2024-01-15 DIAGNOSIS — D225 Melanocytic nevi of trunk: Secondary | ICD-10-CM | POA: Diagnosis not present

## 2024-01-15 DIAGNOSIS — L570 Actinic keratosis: Secondary | ICD-10-CM | POA: Diagnosis not present

## 2024-01-15 DIAGNOSIS — L720 Epidermal cyst: Secondary | ICD-10-CM | POA: Diagnosis not present

## 2024-01-15 DIAGNOSIS — D1801 Hemangioma of skin and subcutaneous tissue: Secondary | ICD-10-CM | POA: Diagnosis not present

## 2024-01-15 DIAGNOSIS — L813 Cafe au lait spots: Secondary | ICD-10-CM | POA: Diagnosis not present

## 2024-01-15 DIAGNOSIS — L821 Other seborrheic keratosis: Secondary | ICD-10-CM | POA: Diagnosis not present

## 2024-01-28 ENCOUNTER — Telehealth: Payer: Self-pay | Admitting: Family

## 2024-01-28 ENCOUNTER — Encounter: Payer: Self-pay | Admitting: Family Medicine

## 2024-01-28 ENCOUNTER — Ambulatory Visit (INDEPENDENT_AMBULATORY_CARE_PROVIDER_SITE_OTHER): Admitting: Family Medicine

## 2024-01-28 ENCOUNTER — Other Ambulatory Visit: Payer: Self-pay

## 2024-01-28 VITALS — BP 122/72 | HR 80 | Ht 66.0 in | Wt 231.0 lb

## 2024-01-28 DIAGNOSIS — M1711 Unilateral primary osteoarthritis, right knee: Secondary | ICD-10-CM | POA: Diagnosis not present

## 2024-01-28 DIAGNOSIS — G8929 Other chronic pain: Secondary | ICD-10-CM | POA: Diagnosis not present

## 2024-01-28 DIAGNOSIS — M545 Low back pain, unspecified: Secondary | ICD-10-CM

## 2024-01-28 DIAGNOSIS — M25561 Pain in right knee: Secondary | ICD-10-CM

## 2024-01-28 MED ORDER — TRIAMCINOLONE ACETONIDE 32 MG IX SRER
32.0000 mg | Freq: Once | INTRA_ARTICULAR | Status: AC
  Administered 2024-01-28: 32 mg via INTRA_ARTICULAR

## 2024-01-28 NOTE — Patient Instructions (Signed)
 Thank you for coming in today.   You received an injection today. Seek immediate medical attention if the joint becomes red, extremely painful, or is oozing fluid.

## 2024-01-28 NOTE — Telephone Encounter (Signed)
 Pt is interested in [facility] from [facility]. I did mentioned to him that we do have three providers here that's taking new patients and a message will be sent back to each of them to let them know you are interested and once we get and okay with the [facility] request you requested, we will reach out to you to set you up with something at our earliest convenience.  Please advise, Thanks

## 2024-01-28 NOTE — Progress Notes (Signed)
   I, Luis Knapp, CMA acting as a scribe for Luis Lloyd, MD.  Luis Knapp is a 73 y.o. male who presents to [facility] at [facility] today for exacerbation of his R knee pain. Pt was last seen by Dr. Lloyd on 10/18/23 and his R knee was injected w/ Zilretta .  Today, pt reports exacerbation of rigiht knee pain. Also c/o left hip pain, worse with prolonged standing. Some lower back pain. Improves with rest.   Dx imaging: 09/10/23 R knee XR  Pertinent review of systems: No fevers or chills  Relevant historical information: Hypertension   Exam:  BP 122/72   Pulse 80   Ht 5' 6 (1.676 m)   Wt 231 lb (104.8 kg)   SpO2 97%   BMI 37.28 kg/m  General: Well Developed, well nourished, and in no acute distress.   MSK: Right knee mild effusion normal-appearing otherwise normal motion.  L-spine decreased lumbar motion.  Lower extremity strength is intact.    Lab and Radiology Results   Zilretta  injection right knee Procedure: Real-time Ultrasound Guided Injection of right knee joint superior lateral patellar space Device: Philips Affiniti 50G Images permanently stored and available for review in PACS Verbal informed consent obtained.  Discussed risks and benefits of procedure. Warned about infection, hyperglycemia bleeding, damage to structures among others. Patient expresses understanding and agreement Time-out conducted.   Noted no overlying erythema, induration, or other signs of local infection.   Skin prepped in a sterile fashion.   Local anesthesia: Topical Ethyl chloride.   With sterile technique and under real time ultrasound guidance: Zilretta  32 mg injected into knee joint. Fluid seen entering the joint capsule.   Completed without difficulty   Advised to call if fevers/chills, erythema, induration, drainage, or persistent bleeding.   Images permanently stored and available for review in the ultrasound unit.  Impression: Technically successful  ultrasound guided injection.  Lot number: [id number]      Assessment and Plan: 73 y.o. male with right knee pain due to DJD.  Plan for repeat Zilretta  injection.  Can repeat this in 3 months if needed.  He will let us  know.  Left low back and left lateral hip pain due to some degenerative changes and hip abductor tendinopathy.  Consider physical therapy in the future if needed.   PDMP not reviewed this encounter. Orders Placed This Encounter  Procedures   US  LIMITED JOINT SPACE STRUCTURES LOW RIGHT(NO LINKED CHARGES)    Reason for Exam (SYMPTOM  OR DIAGNOSIS REQUIRED):   right knee pain    Preferred imaging location?:   [facility] ordered this encounter  Medications   Triamcinolone  Acetonide (ZILRETTA ) intra-articular injection 32 mg     Discussed warning signs or symptoms. Please see discharge instructions. Patient expresses understanding.   The above documentation has been reviewed and is accurate and complete Luis Knapp, M.D.

## 2024-01-29 DIAGNOSIS — R9721 Rising PSA following treatment for malignant neoplasm of prostate: Secondary | ICD-10-CM | POA: Diagnosis not present

## 2024-02-07 ENCOUNTER — Other Ambulatory Visit: Payer: Self-pay | Admitting: Family Medicine

## 2024-02-07 NOTE — Telephone Encounter (Signed)
 Last OV 01/28/24 Next OV not scheduled  Last refill 08/06/23 Qty #90/1

## 2024-03-05 DIAGNOSIS — D485 Neoplasm of uncertain behavior of skin: Secondary | ICD-10-CM | POA: Diagnosis not present

## 2024-03-05 DIAGNOSIS — D487 Neoplasm of uncertain behavior of other specified sites: Secondary | ICD-10-CM | POA: Diagnosis not present

## 2024-03-17 ENCOUNTER — Encounter: Payer: Self-pay | Admitting: Radiology

## 2024-03-26 ENCOUNTER — Other Ambulatory Visit (INDEPENDENT_AMBULATORY_CARE_PROVIDER_SITE_OTHER)

## 2024-03-26 ENCOUNTER — Encounter: Payer: Self-pay | Admitting: Orthopaedic Surgery

## 2024-03-26 ENCOUNTER — Ambulatory Visit (INDEPENDENT_AMBULATORY_CARE_PROVIDER_SITE_OTHER): Admitting: Orthopaedic Surgery

## 2024-03-26 DIAGNOSIS — G8929 Other chronic pain: Secondary | ICD-10-CM | POA: Diagnosis not present

## 2024-03-26 DIAGNOSIS — Z96652 Presence of left artificial knee joint: Secondary | ICD-10-CM

## 2024-03-26 DIAGNOSIS — M25561 Pain in right knee: Secondary | ICD-10-CM

## 2024-03-26 DIAGNOSIS — M1711 Unilateral primary osteoarthritis, right knee: Secondary | ICD-10-CM | POA: Insufficient documentation

## 2024-03-26 NOTE — Progress Notes (Signed)
 The patient is a very pleasant and active 73 year old gentleman with a history of a left knee replacement about 8 months ago.  This was secondary to severe osteoarthritis of the left knee.  He said that he has done very well.  His right knee has known bone-on-bone arthritis and he is at the point where his right knee pain is daily and it is detrimentally affecting his mobility, his quality life and his actives of daily living to the point he does wish to proceed with a total knee arthroplasty on the right side.  He would like to have this done after the holidays.  He did have an issue with delirium after his surgery on his left knee so he did rather have this done earlier in the week and at [facility] and I think this is certainly reasonable.  He has tried and failed all forms conservative treatment for the right knee.  Examination of the left operative knee shows that it is nice and straight.  There is no effusion it just some slight numbness.  The incision is well-healed with good range of motion and the knee is ligamentously stable.  The right knee has varus malalignment with a mild effusion and pain throughout the arc of motion of the right knee.  There is patellofemoral crepitation as well.  X-rays of the left knee also show the right knee standing.  The right knee has varus malalignment with bone-on-bone wear of the medial compartment the knee and osteophytes in all compartments.  The left knee is a well-seated press-fit total knee arthroplasty with no complicating features and in good alignment.  At this point he does wish to proceed with scheduling his right total knee arthroplasty after the holidays.  Will be in touch with getting this scheduled.  There is been otherwise no acute changes in medical status.  Having had the surgery before the left side he is fully aware of the risk and benefits of the surgery and what to expect from an intraoperative and postoperative standpoint.

## 2024-03-31 DIAGNOSIS — Z23 Encounter for immunization: Secondary | ICD-10-CM | POA: Diagnosis not present

## 2024-04-03 ENCOUNTER — Ambulatory Visit: Admitting: Family

## 2024-04-22 DIAGNOSIS — R9721 Rising PSA following treatment for malignant neoplasm of prostate: Secondary | ICD-10-CM | POA: Diagnosis not present

## 2024-04-28 ENCOUNTER — Other Ambulatory Visit: Payer: Self-pay

## 2024-04-28 MED ORDER — ROSUVASTATIN CALCIUM 10 MG PO TABS: 10.0000 mg | ORAL_TABLET | Freq: Every day | ORAL | 3 refills | Status: AC

## 2024-04-29 DIAGNOSIS — R9721 Rising PSA following treatment for malignant neoplasm of prostate: Secondary | ICD-10-CM | POA: Diagnosis not present

## 2024-04-29 DIAGNOSIS — C61 Malignant neoplasm of prostate: Secondary | ICD-10-CM | POA: Diagnosis not present

## 2024-04-29 DIAGNOSIS — N5231 Erectile dysfunction following radical prostatectomy: Secondary | ICD-10-CM | POA: Diagnosis not present

## 2024-04-29 DIAGNOSIS — N393 Stress incontinence (female) (male): Secondary | ICD-10-CM | POA: Diagnosis not present

## 2024-05-29 ENCOUNTER — Ambulatory Visit: Admitting: Family Medicine

## 2024-05-29 ENCOUNTER — Encounter: Payer: Self-pay | Admitting: Family Medicine

## 2024-05-29 ENCOUNTER — Ambulatory Visit: Payer: Self-pay | Admitting: Family Medicine

## 2024-05-29 VITALS — BP 140/80 | HR 105 | Temp 98.6°F | Ht 66.0 in | Wt 251.4 lb

## 2024-05-29 DIAGNOSIS — M1A0720 Idiopathic chronic gout, left ankle and foot, without tophus (tophi): Secondary | ICD-10-CM

## 2024-05-29 DIAGNOSIS — E66812 Obesity, class 2: Secondary | ICD-10-CM | POA: Diagnosis not present

## 2024-05-29 DIAGNOSIS — Z6838 Body mass index (BMI) 38.0-38.9, adult: Secondary | ICD-10-CM

## 2024-05-29 DIAGNOSIS — R000 Tachycardia, unspecified: Secondary | ICD-10-CM | POA: Diagnosis not present

## 2024-05-29 DIAGNOSIS — I10 Essential (primary) hypertension: Secondary | ICD-10-CM

## 2024-05-29 DIAGNOSIS — M1711 Unilateral primary osteoarthritis, right knee: Secondary | ICD-10-CM

## 2024-05-29 DIAGNOSIS — E782 Mixed hyperlipidemia: Secondary | ICD-10-CM

## 2024-05-29 DIAGNOSIS — E559 Vitamin D deficiency, unspecified: Secondary | ICD-10-CM

## 2024-05-29 LAB — CBC WITH DIFFERENTIAL/PLATELET
Basophils Absolute: 0.1 K/uL (ref 0.0–0.1)
Basophils Relative: 0.9 % (ref 0.0–3.0)
Eosinophils Absolute: 0.7 K/uL (ref 0.0–0.7)
Eosinophils Relative: 9.2 % — ABNORMAL HIGH (ref 0.0–5.0)
HCT: 40.2 % (ref 39.0–52.0)
Hemoglobin: 14.1 g/dL (ref 13.0–17.0)
Lymphocytes Relative: 8.2 % — ABNORMAL LOW (ref 12.0–46.0)
Lymphs Abs: 0.6 K/uL — ABNORMAL LOW (ref 0.7–4.0)
MCHC: 35.2 g/dL (ref 30.0–36.0)
MCV: 87 fl (ref 78.0–100.0)
Monocytes Absolute: 0.9 K/uL (ref 0.1–1.0)
Monocytes Relative: 11.6 % (ref 3.0–12.0)
Neutro Abs: 5.2 K/uL (ref 1.4–7.7)
Neutrophils Relative %: 70.1 % (ref 43.0–77.0)
Platelets: 250 K/uL (ref 150.0–400.0)
RBC: 4.62 Mil/uL (ref 4.22–5.81)
RDW: 13.6 % (ref 11.5–15.5)
WBC: 7.4 K/uL (ref 4.0–10.5)

## 2024-05-29 LAB — VITAMIN D 25 HYDROXY (VIT D DEFICIENCY, FRACTURES): VITD: 27.69 ng/mL — ABNORMAL LOW (ref 30.00–100.00)

## 2024-05-29 LAB — COMPREHENSIVE METABOLIC PANEL WITH GFR
ALT: 23 U/L (ref 3–53)
AST: 17 U/L (ref 5–37)
Albumin: 4.3 g/dL (ref 3.5–5.2)
Alkaline Phosphatase: 66 U/L (ref 39–117)
BUN: 18 mg/dL (ref 6–23)
CO2: 29 meq/L (ref 19–32)
Calcium: 9.3 mg/dL (ref 8.4–10.5)
Chloride: 101 meq/L (ref 96–112)
Creatinine, Ser: 1.09 mg/dL (ref 0.40–1.50)
GFR: 67.06 mL/min
Glucose, Bld: 115 mg/dL — ABNORMAL HIGH (ref 70–99)
Potassium: 3.6 meq/L (ref 3.5–5.1)
Sodium: 137 meq/L (ref 135–145)
Total Bilirubin: 0.4 mg/dL (ref 0.2–1.2)
Total Protein: 6.9 g/dL (ref 6.0–8.3)

## 2024-05-29 LAB — MICROALBUMIN / CREATININE URINE RATIO
Creatinine,U: 75.2 mg/dL
Microalb Creat Ratio: UNDETERMINED mg/g (ref 0.0–30.0)
Microalb, Ur: <0.7 mg/dL

## 2024-05-29 LAB — TSH: TSH: 4.68 u[IU]/mL (ref 0.35–5.50)

## 2024-05-29 LAB — VITAMIN B12: Vitamin B-12: 336 pg/mL (ref 211–911)

## 2024-05-29 NOTE — Progress Notes (Signed)
 "  New Patient Visit  Subjective:     Patient ID: Luis Knapp, male    DOB: May 17, 1950, 74 y.o.   MRN: [id number]  Chief Complaint  Patient presents with   Establish Care    HPI  Discussed the use of AI scribe software for clinical note transcription with the patient, who gave verbal consent to proceed.  History of Present Illness Luis Knapp is a 74 year old male who presents for an nagging cough and shortness of breath after having influenza A.  Respiratory symptoms - Type A influenza over Christmas treated at urgent care - Three days of severe symptoms, currently with lingering cough and cold symptoms - Sleep disruption persists - Gradual improvement, starting to feel more normal  Preoperative evaluation for right knee arthroplasty - Scheduled for right knee replacement on January 27th - History of left knee replacement last March with difficult recovery - Postoperative complications after left knee replacement included hallucinations and auditory disturbances following Dilaudid  and Haldol  administration - Neuropsychiatric symptoms resolved after approximately three and a half weeks  Weight gain - Gained approximately 20 pounds since September  Hypertension - Currently taking valsartan  for blood pressure management - Amlodipine  discontinued - Home blood pressure readings typically lower than office measurements - Home blood pressure monitor verified for accuracy by patient and spouse     ROS Per HPI  Outpatient Encounter Medications as of 05/29/2024  Medication Sig   allopurinol  (ZYLOPRIM ) 300 MG tablet TAKE 1 TABLET BY MOUTH DAILY   Cholecalciferol  (VITAMIN D -3) 1000 units CAPS Take 1,000 Units by mouth in the morning.   Coenzyme Q10 (COQ-10) 100 MG CAPS Take 100 mg by mouth in the morning.   rosuvastatin  (CRESTOR ) 10 MG tablet Take 1 tablet (10 mg total) by mouth daily.   valsartan -hydrochlorothiazide  (DIOVAN -HCT) 320-25 MG tablet Take 1 tablet by mouth  daily.   [DISCONTINUED] amLODipine  (NORVASC ) 5 MG tablet Take 1 tablet (5 mg total) by mouth at bedtime.   [DISCONTINUED] tiZANidine  (ZANAFLEX ) 2 MG tablet Take 1 tablet (2 mg total) by mouth every 6 (six) hours as needed for muscle spasms.   No facility-administered encounter medications on file as of 05/29/2024.    Past Medical History:  Diagnosis Date   Allergic rhinitis due to pollen    Allergy    Arthritis    Constipation 2018   Diverticulosis of colon (without mention of hemorrhage)    ED (erectile dysfunction)    Elevated PSA    GERD (gastroesophageal reflux disease)    History of colon polyps    2008--  PRE CANEROUS/   2013-- ADENOMA   Low back pain 2018   Muscle spasm 2018   Muscle weakness 2018   Post-void dribbling 2018   Prostate cancer (HCC) 11/01/2016   prostate cancer   [name]    Stress incontinence 2018   Unspecified essential hypertension    Urinary retention 2018   Wears glasses     Past Surgical History:  Procedure Laterality Date   COLONOSCOPY     COLONOSCOPY WITH PROPOFOL   10-30-2011  &  2008   POLYPECTOMY   HYDROCELE EXCISION Right 07/18/2013   Procedure: RIGHT HYDROCELECTOMY ADULT;  Surgeon: Morene LELON Salines, MD;  Location: [facility];  Service: Urology;  Laterality: Right;   LYMPH NODE DISSECTION Bilateral 11/01/2016   Procedure: BILATERAL PELVIC LYMPH NODE DISSECTION;  Surgeon: Salines Morene LELON, MD;  Location: [facility];  Service: Urology;  Laterality: Bilateral;   POLYPECTOMY  PROSTATE BIOPSY     ROBOT ASSISTED LAPAROSCOPIC RADICAL PROSTATECTOMY N/A 11/01/2016   Procedure: XI ROBOTIC ASSISTED LAPAROSCOPIC RADICAL PROSTATECTOMY;  Surgeon: Cam Morene ORN, MD;  Location: [facility];  Service: Urology;  Laterality: N/A;   TOTAL KNEE ARTHROPLASTY Left 08/10/2023   Procedure: ARTHROPLASTY, KNEE, TOTAL;  Surgeon: Vernetta Lonni GRADE, MD;  Location: [facility];  Service: Orthopedics;  Laterality: Left;  LEFT TOTAL KNEE  ARTHROPLASTY  SPINAL + BLOCK    Family History  Problem Relation Age of Onset   Coronary artery disease Father    Heart attack Father        2nd one fatal   Pulmonary embolism Father        ?   Dementia Father    Heart disease Father    Colon cancer Father    Hearing loss Father    Dementia Mother    Hypertension Mother    Macular degeneration Mother    Kidney disease Mother    Arthritis Mother    Varicose Veins Mother    Diabetes Sister    Non-Hodgkin's lymphoma Sister    Cancer Sister    Obesity Sister    Colon cancer Maternal Grandfather    Pancreatic cancer Sister    Asthma Son    Cancer Sister    Prostate cancer Neg Hx    Rectal cancer Neg Hx    Colon polyps Neg Hx    Esophageal cancer Neg Hx     Social History   Socioeconomic History   Marital status: Married    Spouse name: Not on file   Number of children: 2   Years of education: Not on file   Highest education level: 12th grade  Occupational History   Occupation: Retired - Probation Officer: [employer]    Comment: [employer], trained as   Occupation: catering manager firm    Comment: Printmaker  Tobacco Use   Smoking status: Former    Current packs/day: 0.00    Types: Cigarettes    Quit date: 10/17/1995    Years since quitting: 28.6   Smokeless tobacco: Never  Vaping Use   Vaping status: Never Used  Substance and Sexual Activity   Alcohol use: Not Currently    Comment: Not since 1995   Drug use: Not Currently    Types: Marijuana    Comment: only tried in his 86s   Sexual activity: Yes    Comment: Prostatectomy  Other Topics Concern   Not on file  Social History Narrative   Married '77- 19 years divorced; married '02. 2 sons - '79, '80; 1 grandson. Things are ok. Work is OK - quarry manager.    Fun/Hobby: Works at [employer] on a regular basis   Former Smoker, quit in 1997   Social Drivers of Health   Tobacco Use: Medium Risk (06/05/2024)   Patient History     Smoking Tobacco Use: Former    Smokeless Tobacco Use: Never    Passive Exposure: Not on Actuary Strain: Low Risk (05/25/2024)   Overall Financial Resource Strain (CARDIA)    Difficulty of Paying Living Expenses: Not hard at all  Food Insecurity: No Food Insecurity (05/25/2024)   Epic    Worried About Programme Researcher, Broadcasting/film/video in the Last Year: Never true    Ran Out of Food in the Last Year: Never true  Transportation Needs: No Transportation Needs (05/25/2024)   Epic    Lack of Transportation (  Medical): No    Lack of Transportation (Non-Medical): No  Physical Activity: Inactive (05/25/2024)   Exercise Vital Sign    Days of Exercise per Week: 0 days    Minutes of Exercise per Session: Not on file  Stress: No Stress Concern Present (05/25/2024)   [employer] of Occupational Health - Occupational Stress Questionnaire    Feeling of Stress: Not at all  Social Connections: Moderately Isolated (05/25/2024)   Social Connection and Isolation Panel    Frequency of Communication with Friends and Family: Once a week    Frequency of Social Gatherings with Friends and Family: Once a week    Attends Religious Services: More than 4 times per year    Active Member of [employer] or Organizations: No    Attends Banker Meetings: Not on file    Marital Status: Married  Catering Manager Violence: Not At Risk (09/12/2023)   Humiliation, Afraid, Rape, and Kick questionnaire    Fear of Current or Ex-Partner: No    Emotionally Abused: No    Physically Abused: No    Sexually Abused: No  Depression (PHQ2-9): Low Risk (05/29/2024)   Depression (PHQ2-9)    PHQ-2 Score: 0  Alcohol Screen: Low Risk (09/12/2023)   Alcohol Screen    Last Alcohol Screening Score (AUDIT): 0  Housing: Low Risk (05/25/2024)   Epic    Unable to Pay for Housing in the Last Year: No    Number of Times Moved in the Last Year: 0    Homeless in the Last Year: No  Utilities: Not At Risk (09/12/2023)   AHC Utilities     Threatened with loss of utilities: No  Health Literacy: Not on file       Objective:    BP (!) 140/80   Pulse (!) 105   Temp 98.6 F (37 C) (Temporal)   Ht 5' 6 (1.676 m)   Wt 251 lb 6.4 oz (114 kg)   SpO2 97%   BMI 40.58 kg/m    Physical Exam Vitals and nursing note reviewed.  Constitutional:      General: He is not in acute distress.    Appearance: Normal appearance. He is obese.  HENT:     Head: Normocephalic and atraumatic.     Right Ear: External ear normal.     Left Ear: External ear normal.     Nose: Nose normal.     Mouth/Throat:     Mouth: Mucous membranes are moist.     Pharynx: Oropharynx is clear.  Eyes:     Extraocular Movements: Extraocular movements intact.  Cardiovascular:     Rate and Rhythm: Regular rhythm. Tachycardia present.     Pulses: Normal pulses.     Heart sounds: Normal heart sounds.  Pulmonary:     Effort: Pulmonary effort is normal. No respiratory distress.     Breath sounds: Normal breath sounds. No wheezing, rhonchi or rales.  Musculoskeletal:        General: Normal range of motion.     Cervical back: Normal range of motion.     Right lower leg: No edema.     Left lower leg: No edema.  Lymphadenopathy:     Cervical: No cervical adenopathy.  Skin:    General: Skin is warm and dry.  Neurological:     General: No focal deficit present.     Mental Status: He is alert and oriented to person, place, and time.  Psychiatric:  Mood and Affect: Mood normal.        Behavior: Behavior normal.    EKG: Reviewed and interpreted by me Indication: Tachycardia Rate: 95 Interpretation: Sinus rhythm with left fascicular block Changes from previous: None   Results for orders placed or performed in visit on 05/29/24  CBC with Differential/Platelet  Result Value Ref Range   WBC 7.4 4.0 - 10.5 K/uL   RBC 4.62 4.22 - 5.81 Mil/uL   Hemoglobin 14.1 13.0 - 17.0 g/dL   HCT 59.7 60.9 - 47.9 %   MCV 87.0 78.0 - 100.0 fl   MCHC 35.2  30.0 - 36.0 g/dL   RDW 86.3 88.4 - 84.4 %   Platelets 250.0 150.0 - 400.0 K/uL   Neutrophils Relative % 70.1 43.0 - 77.0 %   Lymphocytes Relative 8.2 (L) 12.0 - 46.0 %   Monocytes Relative 11.6 3.0 - 12.0 %   Eosinophils Relative 9.2 (H) 0.0 - 5.0 %   Basophils Relative 0.9 0.0 - 3.0 %   Neutro Abs 5.2 1.4 - 7.7 K/uL   Lymphs Abs 0.6 (L) 0.7 - 4.0 K/uL   Monocytes Absolute 0.9 0.1 - 1.0 K/uL   Eosinophils Absolute 0.7 0.0 - 0.7 K/uL   Basophils Absolute 0.1 0.0 - 0.1 K/uL  Comprehensive metabolic panel with GFR  Result Value Ref Range   Sodium 137 135 - 145 mEq/L   Potassium 3.6 3.5 - 5.1 mEq/L   Chloride 101 96 - 112 mEq/L   CO2 29 19 - 32 mEq/L   Glucose, Bld 115 (H) 70 - 99 mg/dL   BUN 18 6 - 23 mg/dL   Creatinine, Ser 8.90 0.40 - 1.50 mg/dL   Total Bilirubin 0.4 0.2 - 1.2 mg/dL   Alkaline Phosphatase 66 39 - 117 U/L   AST 17 5 - 37 U/L   ALT 23 3 - 53 U/L   Total Protein 6.9 6.0 - 8.3 g/dL   Albumin 4.3 3.5 - 5.2 g/dL   GFR 32.93 >39.99 mL/min   Calcium  9.3 8.4 - 10.5 mg/dL  Microalbumin / creatinine urine ratio  Result Value Ref Range   Microalb, Ur <0.7 mg/dL   Creatinine,U 24.7 mg/dL   Microalb Creat Ratio Unable to calculate 0.0 - 30.0 mg/g  TSH  Result Value Ref Range   TSH 4.68 0.35 - 5.50 uIU/mL  Vitamin B12  Result Value Ref Range   Vitamin B-12 336 211 - 911 pg/mL  VITAMIN D  25 Hydroxy (Vit-D Deficiency, Fractures)  Result Value Ref Range   VITD 27.69 (L) 30.00 - 100.00 ng/mL        Assessment & Plan:   Assessment and Plan Assessment & Plan Essential hypertension Blood pressure elevated, likely due to recent illness and weight gain. Currently on valsartan . Office readings may be higher. - Monitor blood pressure at home and report via MyChart. - Consider reintroducing amlodipine  if systolic remains in 140s.  Tachycardia Heart rate decreased during physical exam, steady rhythm. No immediate concerns. - EKG reassuring  Right knee primary  osteoarthritis, planned total knee arthroplasty Scheduled for right knee total arthroplasty on January 27th. Anesthesia plan includes spinal anesthesia. - Proceed with planned right knee total arthroplasty on January 27th.  Vitamin D  deficiency - Vitamin D  levels today - Chronic, stable  Morbid obesity, due to excess calories with serious comorbidity, BMI 38 - With comorbid hypertension - Discussed decreasing calories and increasing physical activity to help decrease weight - Discussed that even a 5% weight loss would  help also decrease and control blood pressures.  Medication Management - labs today, will dose adjust medications as indicated Routine health maintenance discussed. Labs due and performed today. Follow-up in six months for Medicare wellness compliance. - Performed labs today. - Scheduled follow-up visit in six months.     Orders Placed This Encounter  Procedures   CBC with Differential/Platelet    Release to patient:   Immediate [1]   Comprehensive metabolic panel with GFR    Release to patient:   Immediate [1]   Microalbumin / creatinine urine ratio    Release to patient:   Immediate   TSH   Vitamin B12   VITAMIN D  25 Hydroxy (Vit-D Deficiency, Fractures)   EKG 12-Lead   EKG     No orders of the defined types were placed in this encounter.   Return in about 6 months (around 11/26/2024) for cpe with Corean, AWV with RN.  Corean LITTIE Ku, FNP   "

## 2024-05-29 NOTE — Patient Instructions (Addendum)
 Welcome to [facility]!  Thank you for choosing us  for your Primary Care needs.   We offer in person and video appointments for your convenience. You may call our office to schedule appointments, or you may schedule appointments with me through MyChart.   The best way to get in contact with me is via MyChart message. This will get to me faster than a phone call, unless there is an emergency, then please call 911.  The lab is located downstairs in the Sports Medicine building, we also have xray available there.   We are checking labs today, will be in contact with any results that require further attention  EKG today  If BP is consistently 140s on the top over the next 2 weeks, let's add the amlodipine  back in. Just send me a message with those readings.   Follow-up with me in 6 mos for medication management, sooner if needed.

## 2024-06-02 ENCOUNTER — Other Ambulatory Visit: Payer: Self-pay | Admitting: Physician Assistant

## 2024-06-02 DIAGNOSIS — Z01818 Encounter for other preprocedural examination: Secondary | ICD-10-CM

## 2024-06-04 NOTE — Progress Notes (Signed)
 Surgical Instructions   Your procedure is scheduled on January 27. Report to [facility] Main Entrance A at 6:45 A.M., then check in with the Admitting office. Any questions or running late day of surgery: call [phone]  Questions prior to your surgery date: call [phone], Monday-Friday, 8am-4pm. If you experience any cold or flu symptoms such as cough, fever, chills, shortness of breath, etc. between now and your scheduled surgery, please notify us  at the above number.     Remember:  Do not eat after midnight the night before your surgery  You may drink clear liquids until 5:45am the morning of your surgery.   Clear liquids allowed are: Water , Non-Citrus Juices (without pulp), Carbonated Beverages, Clear Tea (no milk, honey, etc.), Black Coffee Only (NO MILK, CREAM OR POWDERED CREAMER of any kind), and Gatorade.  The night before surgery:  No food after midnight. ONLY clear liquids after midnight  The day of surgery (if you do NOT have diabetes):  Drink ONE (1) Pre-Surgery Clear Ensure by 5:45am the morning of surgery. Drink in one sitting. Do not sip.  This drink was given to you during your hospital  pre-op appointment visit.  Nothing else to drink after completing the  Pre-Surgery Clear Ensure.         If you have questions, please contact your surgeons office.    Take these medicines the morning of surgery with A SIP OF WATER   allopurinol  (ZYLOPRIM   rosuvastatin  (CRESTOR )   May take these medicines IF NEEDED: acetaminophen  (TYLENOL )    One week prior to surgery, STOP taking any Aspirin  (unless otherwise instructed by your surgeon) Aleve, Naproxen, Ibuprofen, Motrin, Advil, Goody's, BC's, all herbal medications, fish oil, and non-prescription vitamins. This includes your Coenzyme Q10 (COQ-10) .                   Do NOT Smoke (Tobacco/Vaping) for 24 hours prior to your procedure.  If you use a CPAP at night, you may bring your mask/headgear for your overnight  stay.   You will be asked to remove any contacts, glasses, piercing's, hearing aid's, dentures/partials prior to surgery. Please bring cases for these items if needed.    Your surgeon will determine if you are to be admitted or discharged the same day.  Patients discharged the day of surgery will not be allowed to drive home, and someone needs to stay with them for 24 hours.  SURGICAL WAITING ROOM VISITATION Patients may have no more than 2 support people in the waiting area - these visitors may rotate.   Pre-op nurse will coordinate an appropriate time for 2 ADULT support persons, who may not rotate, to accompany patient in pre-op.  Children under the age of 68 must have an adult with them who is not the patient and must remain in the main waiting area with an adult.  If the patient needs to stay at the hospital during part of their recovery, the visitor guidelines for inpatient rooms apply.  Please refer to the [facility] website for the visitor guidelines for any additional information.   If you received a COVID test during your pre-op visit  it is requested that you wear a mask when out in public, stay away from anyone that may not be feeling well and notify your surgeon if you develop symptoms. If you have been in contact with anyone that has tested positive in the last 10 days please notify you surgeon.      Pre-operative 4  CHG Bathing Instructions   You can play a key role in reducing the risk of infection after surgery. Your skin needs to be as free of germs as possible. You can reduce the number of germs on your skin by washing with CHG (chlorhexidine  gluconate) soap before surgery. CHG is an antiseptic soap that kills germs and continues to kill germs even after washing.   DO NOT use if you have an allergy to chlorhexidine /CHG or antibacterial soaps. If your skin becomes reddened or irritated, stop using the CHG and notify one of our RNs at [phone].   Please shower with the  CHG soap starting 4 days before surgery using the following schedule:     Please keep in mind the following:  DO NOT shave, including legs and underarms, starting the day of your first shower.   You may shave your face at any point before/day of surgery.  Place clean sheets on your bed the day you start using CHG soap. Use a clean washcloth (not used since being washed) for each shower. DO NOT sleep with pets once you start using the CHG.   CHG Shower Instructions:  Wash your face and private area with normal soap. If you choose to wash your hair, wash first with your normal shampoo.  After you use shampoo/soap, rinse your hair and body thoroughly to remove shampoo/soap residue.  Turn the water  OFF and apply  bottle of CHG soap to a CLEAN washcloth.  Apply CHG soap ONLY FROM YOUR NECK DOWN TO YOUR TOES (washing for 3-5 minutes)  DO NOT use CHG soap on face, private areas, open wounds, or sores.  Pay special attention to the area where your surgery is being performed.  If you are having back surgery, having someone wash your back for you may be helpful. Wait 2 minutes after CHG soap is applied, then you may rinse off the CHG soap.  Pat dry with a clean towel  Put on clean clothes/pajamas   If you choose to wear lotion, please use ONLY the CHG-compatible lotions that are listed below.  Additional instructions for the day of surgery:  If you choose, you may shower the morning of surgery with an antibacterial soap.  DO NOT APPLY any lotions, deodorants, cologne, or perfumes.   Do not bring valuables to the hospital. [facility] is not responsible for any belongings/valuables. Do not wear nail polish, gel polish, artificial nails, or any other type of covering on natural nails (fingers and toes) Do not wear jewelry or makeup Put on clean/comfortable clothes.  Please brush your teeth.  Ask your nurse before applying any prescription medications to the skin.     CHG Compatible  Lotions   Aveeno Moisturizing lotion  Cetaphil Moisturizing Cream  Cetaphil Moisturizing Lotion  Clairol Herbal Essence Moisturizing Lotion, Dry Skin  Clairol Herbal Essence Moisturizing Lotion, Extra Dry Skin  Clairol Herbal Essence Moisturizing Lotion, Normal Skin  Curel Age Defying Therapeutic Moisturizing Lotion with Alpha Hydroxy  Curel Extreme Care Body Lotion  Curel Soothing Hands Moisturizing Hand Lotion  Curel Therapeutic Moisturizing Cream, Fragrance-Free  Curel Therapeutic Moisturizing Lotion, Fragrance-Free  Curel Therapeutic Moisturizing Lotion, Original Formula  Eucerin Daily Replenishing Lotion  Eucerin Dry Skin Therapy Plus Alpha Hydroxy Crme  Eucerin Dry Skin Therapy Plus Alpha Hydroxy Lotion  Eucerin Original Crme  Eucerin Original Lotion  Eucerin Plus Crme Eucerin Plus Lotion  Eucerin TriLipid Replenishing Lotion  Keri Anti-Bacterial Hand Lotion  Keri Deep Conditioning Original Lotion Dry Skin Formula  Softly Scented  Keri Deep Conditioning Original Lotion, Fragrance Free Sensitive Skin Formula  Keri Lotion Fast Absorbing Fragrance Free Sensitive Skin Formula  Keri Lotion Fast Absorbing Softly Scented Dry Skin Formula  Keri Original Lotion  Keri Skin Renewal Lotion Keri Silky Smooth Lotion  Keri Silky Smooth Sensitive Skin Lotion  Nivea Body Creamy Conditioning Oil  Nivea Body Extra Enriched Lotion  Nivea Body Original Lotion  Nivea Body Sheer Moisturizing Lotion Nivea Crme  Nivea Skin Firming Lotion  NutraDerm 30 Skin Lotion  NutraDerm Skin Lotion  NutraDerm Therapeutic Skin Cream  NutraDerm Therapeutic Skin Lotion  ProShield Protective Hand Cream  Provon moisturizing lotion  Please read over the following fact sheets that you were given.

## 2024-06-05 ENCOUNTER — Encounter (HOSPITAL_COMMUNITY)
Admission: RE | Admit: 2024-06-05 | Discharge: 2024-06-05 | Disposition: A | Discharge status: Home / Self Care | Source: Ambulatory Visit | Attending: Orthopaedic Surgery | Admitting: Orthopaedic Surgery

## 2024-06-05 ENCOUNTER — Encounter (HOSPITAL_COMMUNITY): Payer: Self-pay

## 2024-06-05 ENCOUNTER — Other Ambulatory Visit: Payer: Self-pay

## 2024-06-05 DIAGNOSIS — Z01818 Encounter for other preprocedural examination: Secondary | ICD-10-CM | POA: Insufficient documentation

## 2024-06-05 LAB — BASIC METABOLIC PANEL WITH GFR
Anion gap: 12 (ref 5–15)
BUN: 12 mg/dL (ref 8–23)
CO2: 27 mmol/L (ref 22–32)
Calcium: 9.4 mg/dL (ref 8.9–10.3)
Chloride: 95 mmol/L — ABNORMAL LOW (ref 98–111)
Creatinine, Ser: 1.01 mg/dL (ref 0.61–1.24)
GFR, Estimated: >60 mL/min
Glucose, Bld: 110 mg/dL — ABNORMAL HIGH (ref 70–99)
Potassium: 3.3 mmol/L — ABNORMAL LOW (ref 3.5–5.1)
Sodium: 134 mmol/L — ABNORMAL LOW (ref 135–145)

## 2024-06-05 LAB — CBC
HCT: 41 % (ref 39.0–52.0)
Hemoglobin: 14.7 g/dL (ref 13.0–17.0)
MCH: 30.7 pg (ref 26.0–34.0)
MCHC: 35.9 g/dL (ref 30.0–36.0)
MCV: 85.6 fL (ref 80.0–100.0)
Platelets: 250 K/uL (ref 150–400)
RBC: 4.79 MIL/uL (ref 4.22–5.81)
RDW: 12.9 % (ref 11.5–15.5)
WBC: 6.8 K/uL (ref 4.0–10.5)
nRBC: 0 % (ref 0.0–0.2)

## 2024-06-05 LAB — TYPE AND SCREEN
ABO/RH(D): B POS
Antibody Screen: NEGATIVE

## 2024-06-05 LAB — SURGICAL PCR SCREEN
MRSA, PCR: NEGATIVE
Staphylococcus aureus: NEGATIVE

## 2024-06-05 NOTE — Progress Notes (Signed)
" °   06/05/24 0809  OBSTRUCTIVE SLEEP APNEA  Have you ever been diagnosed with sleep apnea through a sleep study? No  Do you snore loudly (loud enough to be heard through closed doors)?  0  Do you often feel tired, fatigued, or sleepy during the daytime (such as falling asleep during driving or talking to someone)? 0  Has anyone observed you stop breathing during your sleep? 0  Do you have, or are you being treated for high blood pressure? 1  BMI more than 35 kg/m2? 1  Age > 50 (1-yes) 1  Neck circumference greater than:Male 16 inches or larger, Male 17inches or larger? 1  Male Gender (Yes=1) 1  Obstructive Sleep Apnea Score 5  Score 5 or greater  Results sent to PCP    "

## 2024-06-05 NOTE — Progress Notes (Signed)
 PCP - Corean Ku, FNP Cardiologist - Pt saw Dr. Mona in 2023 for arthrosclerosis noted on a PET scan. No follow-up needed  PPM/ICD - Denies Device Orders - n/a Rep Notified - n/a  Chest x-ray - n/a EKG - 05/29/2024 Stress Test - Denies ECHO - Denies Cardiac Cath - Denies  Sleep Study - Denies - STOPBANG score 5 - results send to PCP  No DM  Last dose of GLP1 agonist- n/a GLP1 instructions: n/a  Blood Thinner Instructions: n/a Aspirin  Instructions: n/a  ERAS Protcol - Clear liquids until 0545 morning of surgery PRE-SURGERY Ensure or G2- Ensure given to patient with instructions  COVID TEST- n/a   Anesthesia review: Yes. Recent +Flu 05/08/24. Pt states the worst of this symptoms occurred within the first week and all other symptoms have gradually resolved. He has been symptom free for about one week.   Patient denies shortness of breath, fever, cough and chest pain at PAT appointment.    All instructions explained to the patient, with a verbal understanding of the material. Patient agrees to go over the instructions while at home for a better understanding. Patient also instructed to self quarantine after being tested for COVID-19. The opportunity to ask questions was provided. \

## 2024-06-06 ENCOUNTER — Telehealth: Payer: Self-pay | Admitting: Orthopaedic Surgery

## 2024-06-06 NOTE — Telephone Encounter (Signed)
 Pt's wife called stating pt has surgery on Tuesday and want to r/s due to weather. Please call pt at [phone].

## 2024-06-09 ENCOUNTER — Encounter: Payer: Self-pay | Admitting: Family Medicine

## 2024-06-10 ENCOUNTER — Ambulatory Visit (HOSPITAL_COMMUNITY): Admission: RE | Admit: 2024-06-10 | Source: Home / Self Care | Admitting: Orthopaedic Surgery

## 2024-06-10 ENCOUNTER — Encounter (HOSPITAL_COMMUNITY): Admission: RE | Payer: Self-pay | Source: Home / Self Care

## 2024-06-11 NOTE — Telephone Encounter (Signed)
 I called and rescheduled surgery.

## 2024-06-18 NOTE — Progress Notes (Unsigned)
" ° °  Acute Office Visit  Subjective:     Patient ID: Saron Tweed, male    DOB: [birth year], 74 y.o.   MRN: [id number]  No chief complaint on file.   HPI  Discussed the use of AI scribe software for clinical note transcription with the patient, who gave verbal consent to proceed.  History of Present Illness      ROS Per HPI      Objective:    There were no vitals taken for this visit.   Physical Exam Vitals and nursing note reviewed.  Constitutional:      General: He is not in acute distress.    Appearance: Normal appearance.  HENT:     Head: Normocephalic and atraumatic.     Right Ear: External ear normal.     Left Ear: External ear normal.     Nose: Nose normal.     Mouth/Throat:     Mouth: Mucous membranes are moist.     Pharynx: Oropharynx is clear.  Eyes:     Extraocular Movements: Extraocular movements intact.  Cardiovascular:     Rate and Rhythm: Normal rate and regular rhythm.     Pulses: Normal pulses.     Heart sounds: Normal heart sounds.  Pulmonary:     Effort: Pulmonary effort is normal. No respiratory distress.     Breath sounds: Normal breath sounds. No wheezing, rhonchi or rales.  Musculoskeletal:        General: Normal range of motion.     Cervical back: Normal range of motion.     Right lower leg: No edema.     Left lower leg: No edema.  Lymphadenopathy:     Cervical: No cervical adenopathy.  Skin:    General: Skin is warm and dry.  Neurological:     General: No focal deficit present.     Mental Status: He is alert and oriented to person, place, and time.  Psychiatric:        Mood and Affect: Mood normal.        Behavior: Behavior normal.     No results found for any visits on 06/19/24.      Assessment & Plan:   Assessment and Plan Assessment & Plan      No orders of the defined types were placed in this encounter.    No orders of the defined types were placed in this encounter.   No follow-ups on  file.  Corean LITTIE Ku, FNP  "

## 2024-06-19 ENCOUNTER — Encounter: Payer: Self-pay | Admitting: Family Medicine

## 2024-06-19 ENCOUNTER — Ambulatory Visit: Payer: Self-pay | Admitting: Family Medicine

## 2024-06-19 ENCOUNTER — Other Ambulatory Visit: Payer: Self-pay | Admitting: *Deleted

## 2024-06-19 ENCOUNTER — Ambulatory Visit (INDEPENDENT_AMBULATORY_CARE_PROVIDER_SITE_OTHER)

## 2024-06-19 ENCOUNTER — Ambulatory Visit: Admitting: Family Medicine

## 2024-06-19 VITALS — BP 136/82 | HR 100 | Temp 98.8°F | Ht 66.0 in | Wt 241.2 lb

## 2024-06-19 DIAGNOSIS — R1084 Generalized abdominal pain: Secondary | ICD-10-CM

## 2024-06-19 DIAGNOSIS — K5909 Other constipation: Secondary | ICD-10-CM | POA: Diagnosis not present

## 2024-06-19 DIAGNOSIS — M1711 Unilateral primary osteoarthritis, right knee: Secondary | ICD-10-CM

## 2024-06-19 DIAGNOSIS — K649 Unspecified hemorrhoids: Secondary | ICD-10-CM

## 2024-06-19 NOTE — Care Plan (Signed)
 Ortho Bundle Case Management Note  Patient Details  Name: Luis Knapp MRN: [id number] Date of Birth: 1950/08/13  [facility] RNCM call to patient to discuss his upcoming Right total knee arthroplasty with Dr. Vernetta at [facility] on 06/24/24. This was rescheduled due to weather. 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 His plan is to return home with assistance from his wife. He has a RW already. Anticipate HHPT will be needed after a short hospital stay. Referral made to [facility] after choice provided. Reviewed post op care instructions and will continue to follow for needs.                 DME Arranged:   (Has RW already) DME Agency:     HH Arranged:  PT HH Agency:  [facility]  Additional Comments: Please contact me with any questions of if this plan should need to change.  Tylene Ned, RN, BSN, [employer]  [phone] 06/19/2024, 4:15 PM

## 2024-06-19 NOTE — Patient Instructions (Addendum)
 May try Magnesium glycinate over the counter, may take the max dose for the next couple of days. Then once your stool is much softer, may decrease the dose but do not stop it until after surgery.   May continue miralax constipation cocktail twice a day.  We are getting an xray today. We will be in contact with any abnormal results that require further attention.  Follow-up with me for new or worsening symptoms.

## 2024-06-23 ENCOUNTER — Encounter: Admitting: Orthopaedic Surgery

## 2024-06-24 ENCOUNTER — Ambulatory Visit (HOSPITAL_COMMUNITY): Admission: RE | Admit: 2024-06-24 | Source: Home / Self Care | Admitting: Orthopaedic Surgery

## 2024-06-24 ENCOUNTER — Encounter (HOSPITAL_COMMUNITY): Admission: RE | Payer: Self-pay | Source: Home / Self Care

## 2024-07-09 ENCOUNTER — Ambulatory Visit: Admitting: Physical Therapy

## 2024-07-09 ENCOUNTER — Encounter: Admitting: Orthopaedic Surgery

## 2024-09-17 ENCOUNTER — Ambulatory Visit
